# Patient Record
Sex: Female | Born: 1965
Health system: Southern US, Community
[De-identification: ages and names within clinical notes are randomized; demographics above are authoritative.]

## PROBLEM LIST (undated history)

## (undated) DIAGNOSIS — B379 Candidiasis, unspecified: Secondary | ICD-10-CM

## (undated) DIAGNOSIS — N390 Urinary tract infection, site not specified: Secondary | ICD-10-CM

## (undated) DIAGNOSIS — M259 Joint disorder, unspecified: Secondary | ICD-10-CM

## (undated) DIAGNOSIS — J189 Pneumonia, unspecified organism: Secondary | ICD-10-CM

## (undated) DIAGNOSIS — M1990 Unspecified osteoarthritis, unspecified site: Secondary | ICD-10-CM

## (undated) HISTORY — DX: Unspecified osteoarthritis, unspecified site: M19.90

## (undated) HISTORY — PX: LAPAROSCOPIC ABDOMINAL EXPLORATION: SHX6249

## (undated) HISTORY — DX: Urinary tract infection, site not specified: N39.0

---

## 1999-12-02 ENCOUNTER — Encounter: Admission: RE | Admit: 1999-12-02 | Discharge: 1999-12-02 | Payer: Self-pay | Admitting: Family Medicine

## 1999-12-02 ENCOUNTER — Encounter: Payer: Self-pay | Admitting: Family Medicine

## 2001-02-08 ENCOUNTER — Other Ambulatory Visit: Admission: RE | Admit: 2001-02-08 | Discharge: 2001-02-08 | Payer: Self-pay | Admitting: Obstetrics and Gynecology

## 2001-03-22 ENCOUNTER — Encounter: Payer: Self-pay | Admitting: Obstetrics and Gynecology

## 2001-03-22 ENCOUNTER — Ambulatory Visit (HOSPITAL_COMMUNITY): Admission: RE | Admit: 2001-03-22 | Discharge: 2001-03-22 | Payer: Self-pay | Admitting: Obstetrics and Gynecology

## 2001-04-06 ENCOUNTER — Encounter: Payer: Self-pay | Admitting: Obstetrics and Gynecology

## 2001-04-06 ENCOUNTER — Ambulatory Visit (HOSPITAL_COMMUNITY): Admission: RE | Admit: 2001-04-06 | Discharge: 2001-04-06 | Payer: Self-pay | Admitting: Obstetrics and Gynecology

## 2001-08-10 ENCOUNTER — Encounter (HOSPITAL_COMMUNITY): Admission: AD | Admit: 2001-08-10 | Discharge: 2001-08-13 | Payer: Self-pay | Admitting: Obstetrics and Gynecology

## 2001-08-14 ENCOUNTER — Inpatient Hospital Stay (HOSPITAL_COMMUNITY): Admission: AD | Admit: 2001-08-14 | Discharge: 2001-08-15 | Payer: Self-pay | Admitting: Obstetrics and Gynecology

## 2002-09-07 ENCOUNTER — Other Ambulatory Visit: Admission: RE | Admit: 2002-09-07 | Discharge: 2002-09-07 | Payer: Self-pay | Admitting: Obstetrics and Gynecology

## 2003-11-14 ENCOUNTER — Inpatient Hospital Stay (HOSPITAL_COMMUNITY): Admission: AD | Admit: 2003-11-14 | Discharge: 2003-11-15 | Payer: Self-pay | Admitting: Obstetrics and Gynecology

## 2003-12-26 ENCOUNTER — Other Ambulatory Visit: Admission: RE | Admit: 2003-12-26 | Discharge: 2003-12-26 | Payer: Self-pay | Admitting: Obstetrics and Gynecology

## 2005-01-28 ENCOUNTER — Other Ambulatory Visit: Admission: RE | Admit: 2005-01-28 | Discharge: 2005-01-28 | Payer: Self-pay | Admitting: Obstetrics and Gynecology

## 2006-02-11 ENCOUNTER — Ambulatory Visit (HOSPITAL_BASED_OUTPATIENT_CLINIC_OR_DEPARTMENT_OTHER): Admission: RE | Admit: 2006-02-11 | Discharge: 2006-02-11 | Payer: Self-pay | Admitting: Surgery

## 2007-05-13 HISTORY — PX: OTHER SURGICAL HISTORY: SHX169

## 2011-12-03 ENCOUNTER — Other Ambulatory Visit (HOSPITAL_COMMUNITY): Payer: Self-pay | Admitting: Internal Medicine

## 2011-12-03 DIAGNOSIS — Z1231 Encounter for screening mammogram for malignant neoplasm of breast: Secondary | ICD-10-CM

## 2011-12-23 ENCOUNTER — Ambulatory Visit (HOSPITAL_COMMUNITY)
Admission: RE | Admit: 2011-12-23 | Discharge: 2011-12-23 | Disposition: A | Discharge status: Home / Self Care | Payer: Self-pay | Source: Ambulatory Visit | Attending: Internal Medicine | Admitting: Internal Medicine

## 2011-12-23 DIAGNOSIS — Z1231 Encounter for screening mammogram for malignant neoplasm of breast: Secondary | ICD-10-CM

## 2013-06-04 ENCOUNTER — Encounter: Payer: Self-pay | Admitting: Physician Assistant

## 2013-06-07 ENCOUNTER — Ambulatory Visit: Payer: Self-pay | Admitting: Physician Assistant

## 2013-06-14 ENCOUNTER — Encounter: Payer: Self-pay | Admitting: Physician Assistant

## 2013-06-14 ENCOUNTER — Ambulatory Visit (INDEPENDENT_AMBULATORY_CARE_PROVIDER_SITE_OTHER): Payer: Self-pay | Admitting: Physician Assistant

## 2013-06-14 VITALS — BP 100/62 | HR 80 | Temp 98.1°F | Resp 16 | Ht 61.0 in | Wt 106.0 lb

## 2013-06-14 DIAGNOSIS — A630 Anogenital (venereal) warts: Secondary | ICD-10-CM

## 2013-06-14 DIAGNOSIS — M549 Dorsalgia, unspecified: Secondary | ICD-10-CM

## 2013-06-14 DIAGNOSIS — R1011 Right upper quadrant pain: Secondary | ICD-10-CM

## 2013-06-14 MED ORDER — MELOXICAM 15 MG PO TABS: 15.0000 mg | ORAL_TABLET | Freq: Every day | ORAL | Status: DC

## 2013-06-14 MED ORDER — TRAMADOL HCL 50 MG PO TABS: 50.0000 mg | ORAL_TABLET | Freq: Two times a day (BID) | ORAL | Status: DC | PRN

## 2013-06-14 MED ORDER — CYCLOBENZAPRINE HCL 10 MG PO TABS: 10.0000 mg | ORAL_TABLET | Freq: Three times a day (TID) | ORAL | Status: DC | PRN

## 2013-06-14 NOTE — Progress Notes (Signed)
   Subjective:    Patient ID: Cynthia Duran, female    DOB: [birth year], 48 y.o.   MRN: [id number]  HPI Patient presents for follow up. She vacuumed her couch on Friday and states she woke up Saturday with muscle spasm in the center of her back with numbness down her right arm, pain in the middle of her back and under left shoulder blade with pain into her right neck, she has take old flexeril that has helped. Worse depending on how she twists.    Review of Systems  Constitutional: Negative.   HENT: Negative.   Respiratory: Negative.   Cardiovascular: Negative.   Gastrointestinal: Positive for abdominal pain.  Musculoskeletal: Positive for arthralgias, back pain and myalgias. Negative for neck pain and neck stiffness.  Skin: Negative.        Objective:   Physical Exam  Constitutional: She is oriented to person, place, and time. She appears well-developed and well-nourished.  HENT:  Head: Normocephalic and atraumatic.  Right Ear: External ear normal.  Left Ear: External ear normal.  Mouth/Throat: Oropharynx is clear and moist.  Eyes: Conjunctivae and EOM are normal. Pupils are equal, round, and reactive to light.  Neck: Normal range of motion. Neck supple. No thyromegaly present.  Cardiovascular: Normal rate, regular rhythm and normal heart sounds.  Exam reveals no gallop and no friction rub.   No murmur heard. Pulmonary/Chest: Effort normal and breath sounds normal. No respiratory distress. She has no wheezes.  Abdominal: Soft. Bowel sounds are normal. She exhibits no distension and no mass. There is no tenderness. There is no rebound and no guarding.  Genitourinary:    There is no rash, tenderness, lesion or injury on the right labia. There is no rash, tenderness, lesion or injury on the left labia. No erythema around the vagina. No vaginal discharge found.  Musculoskeletal: Normal range of motion.       Thoracic back: She exhibits tenderness, pain and spasm. She exhibits no  bony tenderness, no swelling, no edema, no deformity and no laceration.  Lymphadenopathy:    She has no cervical adenopathy.  Neurological: She is alert and oriented to person, place, and time. She displays normal reflexes. No cranial nerve deficit. Coordination normal.  Skin: Skin is warm and dry.  Psychiatric: She has a normal mood and affect.      Assessment & Plan:  Back pain- very positional- Flexeril 10 #90 NR, Tramadol 50 #60 NR RUQ pain- normal exam- do low fat diet ? Genital warts, suggest going to health department

## 2013-06-14 NOTE — Patient Instructions (Addendum)
Genital Warts Genital warts are caused by a germ (human papillomavirus, HPV). This germ is spread by having unprotected sex (intercourse) with an infected person. It can be spread by vaginal, anal, and oral sex. A person who is infected might not show any signs or problems. HOME CARE  Follow your doctor's treatment instructions.  Do not use medicine that is meant for hand warts. Only take medicine as told by your doctor.  Tell your past and current sex partner(s) that you have genital warts. They may need treatment.  Avoid sexual contact while you are being treated.  Do not touch or scratch the warts. You could spread it to other parts of your body.  Women with genital warts should have a cervical cancer check (Pap test) at least once a year.  Tell your doctor if you become pregnant. The germ can be passed to the baby.  After treatment, use a condom during sex.  Ask your doctor before using anti-itch creams. GET HELP RIGHT AWAY IF:   Your treated skin becomes red, puffy (swollen), or painful.  You have a fever.  You feel generally sick.  You feel little lumps in and around your genital area.  You are bleeding or have pain during sex. MAKE SURE YOU:   Understand these instructions.  Will watch your condition.  Will get help right away if you are not doing well or get worse. Document Released: 07/23/2009 Document Revised: 07/21/2011 Document Reviewed: 11/04/2010 [facility] Patient Information 2014 [place], [place]. Back Pain, Adult Low back pain is very common. About 1 in 5 people have back pain.The cause of low back pain is rarely dangerous. The pain often gets better over time.About half of people with a sudden onset of back pain feel better in just 2 weeks. About 8 in 10 people feel better by 6 weeks.  CAUSES Some common causes of back pain include:  Strain of the muscles or ligaments supporting the spine.  Wear and tear (degeneration) of the spinal  discs.  Arthritis.  Direct injury to the back. DIAGNOSIS Most of the time, the direct cause of low back pain is not known.However, back pain can be treated effectively even when the exact cause of the pain is unknown.Answering your caregiver's questions about your overall health and symptoms is one of the most accurate ways to make sure the cause of your pain is not dangerous. If your caregiver needs more information, he or she may order lab work or imaging tests (X-rays or MRIs).However, even if imaging tests show changes in your back, this usually does not require surgery. HOME CARE INSTRUCTIONS For many people, back pain returns.Since low back pain is rarely dangerous, it is often a condition that people can learn to [facility] their own.   Remain active. It is stressful on the back to sit or stand in one place. Do not sit, drive, or stand in one place for more than 30 minutes at a time. Take short walks on level surfaces as soon as pain allows.Try to increase the length of time you walk each day.  Do not stay in bed.Resting more than 1 or 2 days can delay your recovery.  Do not avoid exercise or work.Your body is made to move.It is not dangerous to be active, even though your back may hurt.Your back will likely heal faster if you return to being active before your pain is gone.  Pay attention to your body when you bend and lift. Many people have less discomfortwhen lifting  if they bend their knees, keep the load close to their bodies,and avoid twisting. Often, the most comfortable positions are those that put less stress on your recovering back.  Find a comfortable position to sleep. Use a firm mattress and lie on your side with your knees slightly bent. If you lie on your back, put a pillow under your knees.  Only take over-the-counter or prescription medicines as directed by your caregiver. Over-the-counter medicines to reduce pain and inflammation are often the most  helpful.Your caregiver may prescribe muscle relaxant drugs.These medicines help dull your pain so you can more quickly return to your normal activities and healthy exercise.  Put ice on the injured area.  Put ice in a plastic bag.  Place a towel between your skin and the bag.  Leave the ice on for 15-20 minutes, 03-04 times a day for the first 2 to 3 days. After that, ice and heat may be alternated to reduce pain and spasms.  Ask your caregiver about trying back exercises and gentle massage. This may be of some benefit.  Avoid feeling anxious or stressed.Stress increases muscle tension and can worsen back pain.It is important to recognize when you are anxious or stressed and learn ways to manage it.Exercise is a great option. SEEK MEDICAL CARE IF:  You have pain that is not relieved with rest or medicine.  You have pain that does not improve in 1 week.  You have new symptoms.  You are generally not feeling well. SEEK IMMEDIATE MEDICAL CARE IF:   You have pain that radiates from your back into your legs.  You develop new bowel or bladder control problems.  You have unusual weakness or numbness in your arms or legs.  You develop nausea or vomiting.  You develop abdominal pain.  You feel faint. Document Released: 04/28/2005 Document Revised: 10/28/2011 Document Reviewed: 09/16/2010 [facility] Patient Information 2014 [place], [place]. Fat and Cholesterol Control Diet Fat and cholesterol levels in your blood and organs are influenced by your diet. High levels of fat and cholesterol may lead to diseases of the heart, small and large blood vessels, gallbladder, liver, and pancreas. CONTROLLING FAT AND CHOLESTEROL WITH DIET Although exercise and lifestyle factors are important, your diet is key. That is because certain foods are known to raise cholesterol and others to lower it. The goal is to balance foods for their effect on cholesterol and more importantly, to replace saturated  and trans fat with other types of fat, such as monounsaturated fat, polyunsaturated fat, and omega-3 fatty acids. On average, a person should consume no more than 15 to 17 g of saturated fat daily. Saturated and trans fats are considered "bad" fats, and they will raise LDL cholesterol. Saturated fats are primarily found in animal products such as meats, butter, and cream. However, that does not mean you need to give up all your favorite foods. Today, there are good tasting, low-fat, low-cholesterol substitutes for most of the things you like to eat. Choose low-fat or nonfat alternatives. Choose round or loin cuts of red meat. These types of cuts are lowest in fat and cholesterol. Chicken (without the skin), fish, veal, and ground [place] breast are great choices. Eliminate fatty meats, such as hot dogs and salami. Even shellfish have little or no saturated fat. Have a 3 oz (85 g) portion when you eat lean meat, poultry, or fish. Trans fats are also called "partially hydrogenated oils." They are oils that have been scientifically manipulated so that they are solid  at room temperature resulting in a longer shelf life and improved taste and texture of foods in which they are added. Trans fats are found in stick margarine, some tub margarines, cookies, crackers, and baked goods.  When baking and cooking, oils are a great substitute for butter. The monounsaturated oils are especially beneficial since it is believed they lower LDL and raise HDL. The oils you should avoid entirely are saturated tropical oils, such as coconut and palm.  Remember to eat a lot from food groups that are naturally free of saturated and trans fat, including fish, fruit, vegetables, beans, grains (barley, rice, couscous, bulgur wheat), and pasta (without cream sauces).  IDENTIFYING FOODS THAT LOWER FAT AND CHOLESTEROL  Soluble fiber may lower your cholesterol. This type of fiber is found in fruits such as apples, vegetables such as broccoli,  potatoes, and carrots, legumes such as beans, peas, and lentils, and grains such as barley. Foods fortified with plant sterols (phytosterol) may also lower cholesterol. You should eat at least 2 g per day of these foods for a cholesterol lowering effect.  Read package labels to identify low-saturated fats, trans fat free, and low-fat foods at the supermarket. Select cheeses that have only 2 to 3 g saturated fat per ounce. Use a heart-healthy tub margarine that is free of trans fats or partially hydrogenated oil. When buying baked goods (cookies, crackers), avoid partially hydrogenated oils. Breads and muffins should be made from whole grains (whole-wheat or whole oat flour, instead of "flour" or "enriched flour"). Buy non-creamy canned soups with reduced salt and no added fats.  FOOD PREPARATION TECHNIQUES  Never deep-fry. If you must fry, either stir-fry, which uses very little fat, or use non-stick cooking sprays. When possible, broil, bake, or roast meats, and steam vegetables. Instead of putting butter or margarine on vegetables, use lemon and herbs, applesauce, and cinnamon (for squash and sweet potatoes). Use nonfat yogurt, salsa, and low-fat dressings for salads.  LOW-SATURATED FAT / LOW-FAT FOOD SUBSTITUTES Meats / Saturated Fat (g)  Avoid: Steak, marbled (3 oz/85 g) / 11 g  Choose: Steak, lean (3 oz/85 g) / 4 g  Avoid: Hamburger (3 oz/85 g) / 7 g  Choose: Hamburger, lean (3 oz/85 g) / 5 g  Avoid: Ham (3 oz/85 g) / 6 g  Choose: Ham, lean cut (3 oz/85 g) / 2.4 g  Avoid: Chicken, with skin, dark meat (3 oz/85 g) / 4 g  Choose: Chicken, skin removed, dark meat (3 oz/85 g) / 2 g  Avoid: Chicken, with skin, light meat (3 oz/85 g) / 2.5 g  Choose: Chicken, skin removed, light meat (3 oz/85 g) / 1 g Dairy / Saturated Fat (g)  Avoid: Whole milk (1 cup) / 5 g  Choose: Low-fat milk, 2% (1 cup) / 3 g  Choose: Low-fat milk, 1% (1 cup) / 1.5 g  Choose: Skim milk (1 cup) / 0.3 g  Avoid:  Hard cheese (1 oz/28 g) / 6 g  Choose: Skim milk cheese (1 oz/28 g) / 2 to 3 g  Avoid: Cottage cheese, 4% fat (1 cup) / 6.5 g  Choose: Low-fat cottage cheese, 1% fat (1 cup) / 1.5 g  Avoid: Ice cream (1 cup) / 9 g  Choose: Sherbet (1 cup) / 2.5 g  Choose: Nonfat frozen yogurt (1 cup) / 0.3 g  Choose: Frozen fruit bar / trace  Avoid: Whipped cream (1 tbs) / 3.5 g  Choose: Nondairy whipped topping (1 tbs) / 1 g  Condiments / Saturated Fat (g)  Avoid: Mayonnaise (1 tbs) / 2 g  Choose: Low-fat mayonnaise (1 tbs) / 1 g  Avoid: Butter (1 tbs) / 7 g  Choose: Extra light margarine (1 tbs) / 1 g  Avoid: Coconut oil (1 tbs) / 11.8 g  Choose: Olive oil (1 tbs) / 1.8 g  Choose: Corn oil (1 tbs) / 1.7 g  Choose: Safflower oil (1 tbs) / 1.2 g  Choose: Sunflower oil (1 tbs) / 1.4 g  Choose: Soybean oil (1 tbs) / 2.4 g  Choose: Canola oil (1 tbs) / 1 g Document Released: 04/28/2005 Document Revised: 08/23/2012 Document Reviewed: 10/17/2010 [employer] Patient Information 2014 [place], [place].

## 2013-07-05 ENCOUNTER — Ambulatory Visit: Payer: Self-pay | Admitting: Physician Assistant

## 2013-07-25 ENCOUNTER — Other Ambulatory Visit: Payer: Self-pay | Admitting: Physician Assistant

## 2013-07-25 MED ORDER — ACYCLOVIR 400 MG PO TABS: 400.0000 mg | ORAL_TABLET | Freq: Every day | ORAL | Status: AC

## 2014-02-27 ENCOUNTER — Emergency Department (HOSPITAL_COMMUNITY)
Admission: EM | Admit: 2014-02-27 | Discharge: 2014-02-27 | Disposition: A | Discharge status: Home / Self Care | Payer: Medicaid Other | Attending: Emergency Medicine | Admitting: Emergency Medicine

## 2014-02-27 ENCOUNTER — Emergency Department (HOSPITAL_COMMUNITY): Payer: Medicaid Other

## 2014-02-27 ENCOUNTER — Encounter (HOSPITAL_COMMUNITY): Payer: Self-pay | Admitting: Emergency Medicine

## 2014-02-27 DIAGNOSIS — X58XXXA Exposure to other specified factors, initial encounter: Secondary | ICD-10-CM | POA: Insufficient documentation

## 2014-02-27 DIAGNOSIS — S39012A Strain of muscle, fascia and tendon of lower back, initial encounter: Secondary | ICD-10-CM | POA: Insufficient documentation

## 2014-02-27 DIAGNOSIS — R05 Cough: Secondary | ICD-10-CM | POA: Insufficient documentation

## 2014-02-27 DIAGNOSIS — R109 Unspecified abdominal pain: Secondary | ICD-10-CM | POA: Diagnosis not present

## 2014-02-27 DIAGNOSIS — Z720 Tobacco use: Secondary | ICD-10-CM | POA: Insufficient documentation

## 2014-02-27 DIAGNOSIS — Z791 Long term (current) use of non-steroidal anti-inflammatories (NSAID): Secondary | ICD-10-CM | POA: Insufficient documentation

## 2014-02-27 DIAGNOSIS — Y939 Activity, unspecified: Secondary | ICD-10-CM | POA: Diagnosis not present

## 2014-02-27 DIAGNOSIS — M549 Dorsalgia, unspecified: Secondary | ICD-10-CM | POA: Diagnosis present

## 2014-02-27 DIAGNOSIS — Z87440 Personal history of urinary (tract) infections: Secondary | ICD-10-CM | POA: Insufficient documentation

## 2014-02-27 DIAGNOSIS — Z3202 Encounter for pregnancy test, result negative: Secondary | ICD-10-CM | POA: Insufficient documentation

## 2014-02-27 DIAGNOSIS — Y929 Unspecified place or not applicable: Secondary | ICD-10-CM | POA: Insufficient documentation

## 2014-02-27 DIAGNOSIS — M1990 Unspecified osteoarthritis, unspecified site: Secondary | ICD-10-CM | POA: Diagnosis not present

## 2014-02-27 LAB — URINALYSIS, ROUTINE W REFLEX MICROSCOPIC
BILIRUBIN URINE: NEGATIVE
GLUCOSE, UA: NEGATIVE mg/dL
Hgb urine dipstick: NEGATIVE
KETONES UR: NEGATIVE mg/dL
Leukocytes, UA: NEGATIVE
Nitrite: NEGATIVE
Protein, ur: NEGATIVE mg/dL
Specific Gravity, Urine: 1.002 — ABNORMAL LOW (ref 1.005–1.030)
Urobilinogen, UA: 0.2 mg/dL (ref 0.0–1.0)
pH: 6 (ref 5.0–8.0)

## 2014-02-27 LAB — POC URINE PREG, ED: Preg Test, Ur: NEGATIVE

## 2014-02-27 MED ORDER — KETOROLAC TROMETHAMINE 60 MG/2ML IM SOLN
60.0000 mg | Freq: Once | INTRAMUSCULAR | Status: AC
  Administered 2014-02-27: 60 mg via INTRAMUSCULAR
  Filled 2014-02-27: qty 2

## 2014-02-27 MED ORDER — NAPROXEN 500 MG PO TABS
500.0000 mg | ORAL_TABLET | Freq: Two times a day (BID) | ORAL | Status: DC
Start: 2014-02-27 — End: 2014-03-27

## 2014-02-27 MED ORDER — HYDROCODONE-ACETAMINOPHEN 5-325 MG PO TABS: 1.0000 | ORAL_TABLET | ORAL | Status: DC | PRN

## 2014-02-27 MED ORDER — DIAZEPAM 5 MG PO TABS: 5.0000 mg | ORAL_TABLET | Freq: Four times a day (QID) | ORAL | Status: DC | PRN

## 2014-02-27 NOTE — ED Provider Notes (Signed)
CSN: [id number]     Arrival date & time 02/27/14  1744 History  This chart was scribed for a non-physician practitioner, Jeannett Senior, PA-C, working with Leota Jacobsen, MD by Cathie Hoops, ED Scribe. The patient was seen in W[facility]6/[facility]. The patient's care was started at 6:31 PM.   Chief Complaint  Patient presents with  . Back Pain   (Consider location/radiation/quality/duration/timing/severity/associated sxs/prior Treatment) The history is provided by the patient. No language interpreter was used.   HPI Comments: Cynthia Duran is a 48 y.o. female who presents to the Emergency Department complaining of acute, moderate, gradually worsening back pain onset three days ago. Pt has associated cough, abdominal pain and back pain. Pt notes her pain radiates to her lower back and wraps around to her abdomen. Pt notes her cough changed this morning and sounds less like a croupy cough and more like a smoker's cough. Pt states she picked up several storage boxes the day before her pain began. Pt notes she feels like she is having gallbladder "attacks". Pt notes her pain is worsened with ROM, laying down, sitting down. She states she has 650 mg acetaminophen and 50 mg Tramadol with minimal relief. Pt denies bladder incontinence, bowel incontinence, dysuria, frequency, fever, constipation, SOB, nausea, vomiting and diarrhea.  Pt denies having PCP. Pt states she drove here today.  Past Medical History  Diagnosis Date  . Arthritis   . Allergy   . Frequent UTI    History reviewed. No pertinent past surgical history. Family History  Problem Relation Age of Onset  . Arthritis Mother     Rhematoid  . COPD Mother   . Heart disease Father   . Hypertension Father    History  Substance Use Topics  . Smoking status: Current Every Day Smoker -- 1.50 packs/day for 30 years  . Smokeless tobacco: Never Used  . Alcohol Use: No   OB History   Grav Para Term Preterm Abortions TAB SAB Ect Mult Living                  Review of Systems  Constitutional: Negative for fever.  Respiratory: Positive for cough. Negative for shortness of breath.   Gastrointestinal: Positive for abdominal pain. Negative for nausea, vomiting, constipation and abdominal distention.  Genitourinary: Negative for dysuria, frequency, flank pain and difficulty urinating.  Musculoskeletal: Positive for arthralgias and back pain. Negative for gait problem.  Skin: Negative for rash.   Allergies  Review of patient's allergies indicates no known allergies.  Home Medications   Prior to Admission medications   Medication Sig Start Date End Date Taking? Authorizing Provider  cyclobenzaprine (FLEXERIL) 10 MG tablet Take 1 tablet (10 mg total) by mouth 3 (three) times daily as needed for muscle spasms. 06/14/13   Vicie Mutters, PA-C  meloxicam (MOBIC) 15 MG tablet Take 1 tablet (15 mg total) by mouth daily. 06/14/13   Vicie Mutters, PA-C  traMADol (ULTRAM) 50 MG tablet Take 1 tablet (50 mg total) by mouth every 12 (twelve) hours as needed. 06/14/13   Vicie Mutters, PA-C   Triage Vitals: BP 112/73  Pulse 79  Temp(Src) 97.6 F (36.4 C) (Oral)  Resp 18  SpO2 95%  Physical Exam  Nursing note and vitals reviewed. Constitutional: She is oriented to person, place, and time. She appears well-developed and well-nourished. No distress.  HENT:  Head: Normocephalic.  Eyes: Conjunctivae are normal.  Neck: Neck supple.  Cardiovascular: Normal rate, regular rhythm and normal heart sounds.  Pulmonary/Chest: Effort normal and breath sounds normal. No respiratory distress. She has no wheezes. She has no rales.  Abdominal: Soft. Bowel sounds are normal. She exhibits no distension. There is no tenderness. There is no rebound.  Left CVA tenderness  Musculoskeletal: She exhibits no edema.  Tenderness over left lumbar paraspinal muscles. Pain with left straight leg raise. Normal hip exam. No midline lumbar spine tenderness.   Neurological: She is alert and oriented to person, place, and time.  5/5 and equal lower extremity strength. 2+ and equal patellar reflexes bilaterally. Pt able to dorsiflex bilateral toes and feet with good strength against resistance. Equal sensation bilaterally over thighs and lower legs.   Skin: Skin is warm and dry.  Psychiatric: She has a normal mood and affect. Her behavior is normal.    ED Course  Procedures (including critical care time) DIAGNOSTIC STUDIES: Oxygen Saturation is 95% on RA, adequate by my interpretation.    COORDINATION OF CARE: 6:36 PM- Patient informed of current plan for treatment and evaluation and agrees with plan at this time.    Labs Review Labs Reviewed  URINALYSIS, ROUTINE W REFLEX MICROSCOPIC - Abnormal; Notable for the following:    Specific Gravity, Urine 1.002 (*)    All other components within normal limits  POC URINE PREG, ED    Imaging Review Dg Chest 2 View  02/27/2014   CLINICAL DATA:  Cough, low back pain.  EXAM: CHEST  2 VIEW  COMPARISON:  None.  FINDINGS: The heart size and mediastinal contours are within normal limits. Hyperexpansion of the lungs is noted. No pneumothorax or pleural effusion is noted. Both lungs are clear. The visualized skeletal structures are unremarkable.  IMPRESSION: No acute cardiopulmonary abnormality seen.   Electronically Signed   By: Sabino Dick M.D.   On: 02/27/2014 19:43     EKG Interpretation None      MDM   Final diagnoses:  Lumbar strain, initial encounter   Patient with pleuritic left lumbar paraspinal pain, also worsened with movement. She does remember lifting some boxes, but nothing that she thought would injure her back. Pain is not relieved with at-home treatments. She has no neuro deficits. She does admit to worsening cough. She is a smoker. At this time I doubt patient has a PE, no lower extremity swelling, normal oxygen saturation and heart rate. Will get a chest x-ray to rule out left  lower lobe pneumonia vs versus effusion. Will get urinalysis to rule out pyelonephritis. Will treat with Toradol IM. Patient did drive herself here  Filed Vitals:   02/27/14 1758 02/27/14 2012  BP: 112/73 110/68  Pulse: 79 68  Temp: 97.6 F (36.4 C) 98 F (36.7 C)  TempSrc: Oral Oral  Resp: 18 16  SpO2: 95% 98%    I personally performed the services described in this documentation, which was scribed in my presence. The recorded information has been reviewed and is accurate.    Renold Genta, PA-C 02/27/14 2024

## 2014-02-27 NOTE — Discharge Instructions (Signed)
Naprosyn for inflammation and pain.  norco for severe pain. Valium for spasms as prescribed as needed.  Rest. Heating pads. Stretches. Follow up with your doctor or return here if symptoms worsening.   Lumbosacral Strain Lumbosacral strain is a strain of any of the parts that make up your lumbosacral vertebrae. Your lumbosacral vertebrae are the bones that make up the lower third of your backbone. Your lumbosacral vertebrae are held together by muscles and tough, fibrous tissue (ligaments).  CAUSES  A sudden blow to your back can cause lumbosacral strain. Also, anything that causes an excessive stretch of the muscles in the low back can cause this strain. This is typically seen when people exert themselves strenuously, fall, lift heavy objects, bend, or crouch repeatedly. RISK FACTORS  Physically demanding work.  Participation in pushing or pulling sports or sports that require a sudden twist of the back (tennis, golf, baseball).  Weight lifting.  Excessive lower back curvature.  Forward-tilted pelvis.  Weak back or abdominal muscles or both.  Tight hamstrings. SIGNS AND SYMPTOMS  Lumbosacral strain may cause pain in the area of your injury or pain that moves (radiates) down your leg.  DIAGNOSIS Your health care provider can often diagnose lumbosacral strain through a physical exam. In some cases, you may need tests such as X-ray exams.  TREATMENT  Treatment for your lower back injury depends on many factors that your clinician will have to evaluate. However, most treatment will include the use of anti-inflammatory medicines. HOME CARE INSTRUCTIONS   Avoid hard physical activities (tennis, racquetball, waterskiing) if you are not in proper physical condition for it. This may aggravate or create problems.  If you have a back problem, avoid sports requiring sudden body movements. Swimming and walking are generally safer activities.  Maintain good posture.  Maintain a healthy  weight.  For acute conditions, you may put ice on the injured area.  Put ice in a plastic bag.  Place a towel between your skin and the bag.  Leave the ice on for 20 minutes, 2-3 times a day.  When the low back starts healing, stretching and strengthening exercises may be recommended. SEEK MEDICAL CARE IF:  Your back pain is getting worse.  You experience severe back pain not relieved with medicines. SEEK IMMEDIATE MEDICAL CARE IF:   You have numbness, tingling, weakness, or problems with the use of your arms or legs.  There is a change in bowel or bladder control.  You have increasing pain in any area of the body, including your belly (abdomen).  You notice shortness of breath, dizziness, or feel faint.  You feel sick to your stomach (nauseous), are throwing up (vomiting), or become sweaty.  You notice discoloration of your toes or legs, or your feet get very cold. MAKE SURE YOU:   Understand these instructions.  Will watch your condition.  Will get help right away if you are not doing well or get worse. Document Released: 02/05/2005 Document Revised: 05/03/2013 Document Reviewed: 12/15/2012 [facility] Patient Information 2015 [place], [place]. This information is not intended to replace advice given to you by your health care provider. Make sure you discuss any questions you have with your health care provider.

## 2014-02-27 NOTE — ED Provider Notes (Signed)
Medical screening examination/treatment/procedure(s) were performed by non-physician practitioner and as supervising physician I was immediately available for consultation/collaboration.  Leota Jacobsen, MD 02/27/14 [id number]

## 2014-02-27 NOTE — ED Notes (Signed)
Pt presents with c/o lower back pain that started approx 3 days ago. Pt reports she did not injure her back to her knowledge, ambulatory to triage.

## 2014-03-27 ENCOUNTER — Encounter: Payer: Self-pay | Admitting: Family Medicine

## 2014-03-27 ENCOUNTER — Ambulatory Visit: Payer: Medicaid Other | Attending: Family Medicine | Admitting: Family Medicine

## 2014-03-27 VITALS — BP 120/77 | HR 75 | Temp 98.4°F | Resp 18 | Wt 106.0 lb

## 2014-03-27 DIAGNOSIS — M545 Low back pain, unspecified: Secondary | ICD-10-CM

## 2014-03-27 DIAGNOSIS — Z720 Tobacco use: Secondary | ICD-10-CM

## 2014-03-27 DIAGNOSIS — E28319 Asymptomatic premature menopause: Secondary | ICD-10-CM | POA: Insufficient documentation

## 2014-03-27 DIAGNOSIS — F17200 Nicotine dependence, unspecified, uncomplicated: Secondary | ICD-10-CM

## 2014-03-27 DIAGNOSIS — R635 Abnormal weight gain: Secondary | ICD-10-CM | POA: Diagnosis not present

## 2014-03-27 DIAGNOSIS — Z87891 Personal history of nicotine dependence: Secondary | ICD-10-CM | POA: Diagnosis not present

## 2014-03-27 DIAGNOSIS — M8580 Other specified disorders of bone density and structure, unspecified site: Secondary | ICD-10-CM

## 2014-03-27 MED ORDER — MELOXICAM 15 MG PO TABS: 15.0000 mg | ORAL_TABLET | Freq: Every day | ORAL | Status: DC

## 2014-03-27 MED ORDER — CYCLOBENZAPRINE HCL 10 MG PO TABS: 10.0000 mg | ORAL_TABLET | Freq: Three times a day (TID) | ORAL | Status: DC | PRN

## 2014-03-27 NOTE — Patient Instructions (Signed)
Ms. Ghazi,  Thank you for coming in today. It was a pleasure meeting you. I look forward to being your primary doctor.   1. Low back pain: X-ray of low back and hip. Go [facility]. Flexeril as needed, muscle relaxer Mobic daily for next week, then as needed, antiinflammatory  I have referred you to physical therapy.  Labs: TSH, check thyroid CMP, CBC, vit D level  Please come back to see me in 2-4 weeks for back pain follow up and physical with pap. The recommendation is to start bone density scan at age 7, but we will look at your x-rays and see if there is evidence of bone loss necessitating an earlier study.  Dr. Adrian Blackwater

## 2014-03-27 NOTE — Progress Notes (Signed)
Establish Care Complaining of low back pain Stated was in the emergency room last month and no changes since then

## 2014-03-27 NOTE — Progress Notes (Signed)
   Subjective:    Patient ID: Cynthia Duran, female    DOB: May 15, 1965, 48 y.o.   MRN: [id number] CC: establish care, low back pain  HPI  1. Low back: one month ago. Has history of tailbone pain x 25 years since delivering oldest child. Pain is bilateral. Pain does radiate upward. Does not radiate down the legs. Everyday pain. Constant pain. Achy pain and crampy pain.   Soc Hx: smoker 1.5 PPD Med Hx: arthritis Fam Hx: mom passed with COPD  Review of Systems As per HPI   GAD 7 score of 14. 0-7. 1-6. 2-2 and 4. 3-1,3,5.     Objective:   Physical Exam BP 120/77 mmHg  Pulse 75  Temp(Src) 98.4 F (36.9 C) (Oral)  Resp 18  Wt 106 lb (48.081 kg)  SpO2 97% General appearance: alert, cooperative and no distress Lungs: clear to auscultation bilaterally Heart: regular rate and rhythm, S1, S2 normal, no murmur, click, rub or gallop Back Exam: Back: Normal Curvature, no deformities or CVA tenderness  Paraspinal Tenderness: L5 and S1 paraspinal   LE Strength 5/5  LE Sensation: in tact  LE Reflexes 2+ and symmetric  Straight leg raise: negative       Assessment & Plan:

## 2014-03-27 NOTE — Assessment & Plan Note (Signed)
A; patient has gained 10 #  P: Screening TSH

## 2014-03-27 NOTE — Assessment & Plan Note (Signed)
A: low back pain w/o sciatica   P: NSAID Flexeril PT Check vit D Lumbar and hip x-ray

## 2014-03-28 ENCOUNTER — Telehealth: Payer: Self-pay | Admitting: Family Medicine

## 2014-03-28 ENCOUNTER — Ambulatory Visit (HOSPITAL_COMMUNITY)
Admission: RE | Admit: 2014-03-28 | Discharge: 2014-03-28 | Disposition: A | Discharge status: Home / Self Care | Payer: Medicaid Other | Source: Ambulatory Visit | Attending: Family Medicine | Admitting: Family Medicine

## 2014-03-28 DIAGNOSIS — M545 Low back pain, unspecified: Secondary | ICD-10-CM

## 2014-03-28 DIAGNOSIS — R102 Pelvic and perineal pain: Secondary | ICD-10-CM | POA: Diagnosis not present

## 2014-03-28 LAB — CBC
HEMATOCRIT: 42.7 % (ref 36.0–46.0)
HEMOGLOBIN: 14.7 g/dL (ref 12.0–15.0)
MCH: 30.8 pg (ref 26.0–34.0)
MCHC: 34.4 g/dL (ref 30.0–36.0)
MCV: 89.5 fL (ref 78.0–100.0)
MPV: 9.7 fL (ref 9.4–12.4)
Platelets: 247 10*3/uL (ref 150–400)
RBC: 4.77 MIL/uL (ref 3.87–5.11)
RDW: 13.1 % (ref 11.5–15.5)
WBC: 8.8 10*3/uL (ref 4.0–10.5)

## 2014-03-28 LAB — COMPLETE METABOLIC PANEL WITH GFR
ALK PHOS: 65 U/L (ref 39–117)
ALT: 10 U/L (ref 0–35)
AST: 16 U/L (ref 0–37)
Albumin: 4.6 g/dL (ref 3.5–5.2)
BILIRUBIN TOTAL: 0.4 mg/dL (ref 0.2–1.2)
BUN: 4 mg/dL — ABNORMAL LOW (ref 6–23)
CO2: 29 mEq/L (ref 19–32)
Calcium: 9.6 mg/dL (ref 8.4–10.5)
Chloride: 106 mEq/L (ref 96–112)
Creat: 0.64 mg/dL (ref 0.50–1.10)
GFR, Est African American: >89 mL/min
GFR, Est Non African American: >89 mL/min
Glucose, Bld: 104 mg/dL — ABNORMAL HIGH (ref 70–99)
Potassium: 4.7 mEq/L (ref 3.5–5.3)
SODIUM: 142 meq/L (ref 135–145)
Total Protein: 6.9 g/dL (ref 6.0–8.3)

## 2014-03-28 LAB — TSH: TSH: 1.247 u[IU]/mL (ref 0.350–4.500)

## 2014-03-28 LAB — VITAMIN D 25 HYDROXY (VIT D DEFICIENCY, FRACTURES): Vit D, 25-Hydroxy: 39 ng/mL (ref 30–100)

## 2014-03-28 NOTE — Telephone Encounter (Signed)
Please call patient. Normal labs. Vit D, TSH, CBC, CMP,

## 2014-03-31 NOTE — Addendum Note (Signed)
Addended by: Boykin Nearing on: 03/31/2014 03:16 PM   Modules accepted: Orders

## 2014-04-03 ENCOUNTER — Telehealth: Payer: Self-pay | Admitting: *Deleted

## 2014-04-03 NOTE — Telephone Encounter (Signed)
Pt aware of results 

## 2014-04-03 NOTE — Telephone Encounter (Signed)
-----   Message from Minerva Ends, MD sent at 03/31/2014  3:15 PM EST ----- Mild disc disease in low back especially L2-L3, No hip arthritis.  Plan for f/u dexa scan, will need to be scheduled.

## 2014-04-18 ENCOUNTER — Other Ambulatory Visit (HOSPITAL_COMMUNITY)
Admission: RE | Admit: 2014-04-18 | Discharge: 2014-04-18 | Disposition: A | Discharge status: Home / Self Care | Payer: Medicaid Other | Source: Ambulatory Visit | Attending: Family Medicine | Admitting: Family Medicine

## 2014-04-18 ENCOUNTER — Encounter: Payer: Self-pay | Admitting: Family Medicine

## 2014-04-18 ENCOUNTER — Ambulatory Visit: Payer: Medicaid Other | Attending: Family Medicine | Admitting: Family Medicine

## 2014-04-18 VITALS — BP 110/74 | HR 72 | Temp 98.4°F | Resp 16 | Ht 61.0 in | Wt 110.0 lb

## 2014-04-18 DIAGNOSIS — R1011 Right upper quadrant pain: Secondary | ICD-10-CM | POA: Insufficient documentation

## 2014-04-18 DIAGNOSIS — B373 Candidiasis of vulva and vagina: Secondary | ICD-10-CM | POA: Diagnosis not present

## 2014-04-18 DIAGNOSIS — Z113 Encounter for screening for infections with a predominantly sexual mode of transmission: Secondary | ICD-10-CM | POA: Diagnosis present

## 2014-04-18 DIAGNOSIS — Z0000 Encounter for general adult medical examination without abnormal findings: Secondary | ICD-10-CM | POA: Diagnosis not present

## 2014-04-18 DIAGNOSIS — Z124 Encounter for screening for malignant neoplasm of cervix: Secondary | ICD-10-CM | POA: Insufficient documentation

## 2014-04-18 DIAGNOSIS — B3731 Acute candidiasis of vulva and vagina: Secondary | ICD-10-CM

## 2014-04-18 DIAGNOSIS — R109 Unspecified abdominal pain: Secondary | ICD-10-CM | POA: Diagnosis not present

## 2014-04-18 DIAGNOSIS — Z1151 Encounter for screening for human papillomavirus (HPV): Secondary | ICD-10-CM | POA: Insufficient documentation

## 2014-04-18 DIAGNOSIS — Z0001 Encounter for general adult medical examination with abnormal findings: Secondary | ICD-10-CM | POA: Diagnosis present

## 2014-04-18 DIAGNOSIS — N760 Acute vaginitis: Secondary | ICD-10-CM | POA: Insufficient documentation

## 2014-04-18 DIAGNOSIS — Z01419 Encounter for gynecological examination (general) (routine) without abnormal findings: Secondary | ICD-10-CM | POA: Diagnosis present

## 2014-04-18 LAB — CERVICOVAGINAL ANCILLARY ONLY
WET PREP (BD AFFIRM): NEGATIVE
WET PREP (BD AFFIRM): NEGATIVE
WET PREP (BD AFFIRM): POSITIVE — AB

## 2014-04-18 NOTE — Assessment & Plan Note (Signed)
A; intermittent pains. Normal exam P: Abdominal ultrasound to evaluate gallbladder

## 2014-04-18 NOTE — Progress Notes (Signed)
   Subjective:    Patient ID: Cynthia Duran, female    DOB: 1966/04/29, 48 y.o.   MRN: [id number] CC: wellness physical  HPI No acute complaints or concerns    Review of Systems General:  Negative for nexplained weight loss, fever Skin: Negative for new or changing mole, sore that won't heal HEENT: Negative for trouble hearing, trouble seeing, ringing in ears, mouth sores, hoarseness, change in voice, dysphagia. CV:  Negative for chest pain, dyspnea, edema, palpitations Resp: Negative for cough, dyspnea, hemoptysis GI: positive for intermittent sharp RUQ pains. Negative for nausea, vomiting, diarrhea, constipation, , melena, hematochezia. GU: Negative for dysuria, incontinence, urinary hesitance, hematuria, vaginal or penile discharge, polyuria, sexual difficulty, lumps in testicle or breasts MSK: Positive for low back pain. Otherwise, negative for muscle cramps or aches, joint pain or swelling Neuro: Negative for headaches, weakness, numbness, dizziness, passing out/fainting Psych: Negative for depression, anxiety, memory problems      Objective:   Physical Exam BP 110/74 mmHg  Pulse 72  Temp(Src) 98.4 F (36.9 C) (Oral)  Resp 16  Ht 5\' 1"  (1.549 m)  Wt 110 lb (49.896 kg)  BMI 20.80 kg/m2  SpO2 99%  LMP 09/27/2007 (Exact Date)  General Appearance:    Alert, cooperative, no distress, appears stated age  Head:    Normocephalic, without obvious abnormality, atraumatic  Eyes:    PERRL, conjunctiva/corneas clear, EOM's intact,   , both eyes  Ears:    Normal TM's and external ear canals, both ears  Nose:   Nares normal, septum midline, mucosa normal, no drainage    or sinus tenderness  Throat:   Lips, mucosa, and tongue normal; teeth and gums normal  Neck:   Supple, symmetrical, trachea midline, no adenopathy;    thyroid:  no enlargement/tenderness/nodules; no carotid   bruit or JVD  Back:     Symmetric, no curvature, ROM normal, no CVA tenderness  Lungs:     Clear to  auscultation bilaterally, respirations unlabored  Chest Wall:    No tenderness or deformity   Heart:    Regular rate and rhythm, S1 and S2 normal, no murmur, rub   or gallop  Breast Exam:    No tenderness, masses, or nipple abnormality  Abdomen:     Soft, non-tender, bowel sounds active all four quadrants,    no masses, no organomegaly  Genitalia:    Normal female one small papule with ingrown hair L perineum, no discharge or tenderness. Normal vagina and cervix. No CMT, uterine or adnexal mass or tenderness.   Rectal:    Deffered  Extremities:   Extremities normal, atraumatic, no cyanosis or edema  Pulses:   2+ and symmetric all extremities  Skin:   Skin color, texture, turgor normal, no rashes or lesions  Lymph nodes:   Cervical, supraclavicular, and axillary nodes normal  Neurologic:   CNII-XII intact, normal strength, sensation and reflexes    throughout         Assessment & Plan:

## 2014-04-18 NOTE — Assessment & Plan Note (Signed)
Pap done today  

## 2014-04-18 NOTE — Progress Notes (Signed)
F/U Back pain, stated no changes since last visit Annual Physical and pap

## 2014-04-18 NOTE — Patient Instructions (Signed)
Cynthia Duran,  Thank you for coming in today. Your exam is normal.   Have a happy holiday. I will call you with bone density scan results.   F/u in 4 weeks.  Dr. Adrian Blackwater

## 2014-04-19 DIAGNOSIS — B373 Candidiasis of vulva and vagina: Secondary | ICD-10-CM | POA: Insufficient documentation

## 2014-04-19 DIAGNOSIS — B3731 Acute candidiasis of vulva and vagina: Secondary | ICD-10-CM | POA: Insufficient documentation

## 2014-04-19 LAB — CYTOLOGY - PAP

## 2014-04-19 LAB — CERVICOVAGINAL ANCILLARY ONLY
Chlamydia: NEGATIVE
Neisseria Gonorrhea: NEGATIVE

## 2014-04-19 MED ORDER — FLUCONAZOLE 150 MG PO TABS: 150.0000 mg | ORAL_TABLET | Freq: Once | ORAL | Status: DC

## 2014-04-19 NOTE — Addendum Note (Signed)
Addended by: Boykin Nearing on: 04/19/2014 09:20 AM   Modules accepted: Orders

## 2014-04-19 NOTE — Assessment & Plan Note (Signed)
Noted on screening wet prep. No symptoms. Diflucan x one

## 2014-04-20 ENCOUNTER — Ambulatory Visit (HOSPITAL_COMMUNITY)
Admission: RE | Admit: 2014-04-20 | Discharge: 2014-04-20 | Disposition: A | Discharge status: Home / Self Care | Payer: Medicaid Other | Source: Ambulatory Visit | Attending: Family Medicine | Admitting: Family Medicine

## 2014-04-20 DIAGNOSIS — M545 Low back pain: Secondary | ICD-10-CM | POA: Insufficient documentation

## 2014-04-24 ENCOUNTER — Telehealth: Payer: Self-pay | Admitting: *Deleted

## 2014-04-24 NOTE — Telephone Encounter (Signed)
-----   Message from Minerva Ends, MD sent at 04/19/2014  9:18 AM EST ----- Yeast on wet prep will treat with diflucan x one. Awaiting GC/chlam and pap.

## 2014-04-24 NOTE — Telephone Encounter (Signed)
Left voice message to return call 

## 2014-04-24 NOTE — Telephone Encounter (Signed)
-----   Message from Minerva Ends, MD sent at 04/20/2014  5:28 PM EST ----- Pap negative repeat in 5 years

## 2014-04-24 NOTE — Telephone Encounter (Signed)
Pt aware of pap smear results    Betti Cruz, RMA at 04/24/2014 9:11 AM    Status: Signed      Expand All Collapse All    Left voice message to return call     Betti Cruz, RMA at 04/24/2014 9:11 AM    Status: Signed      Expand All Collapse All    ----- Message from Minerva Ends, MD sent at 04/20/2014 5:28 PM EST ----- Pap negative repeat in 5 years   Betti Cruz, RMA at 04/24/2014 9:10 AM    Status: Signed      Expand All Collapse All    ----- Message from Minerva Ends, MD sent at 04/19/2014 9:18 AM EST ----- Yeast on wet prep will treat with diflucan x one. Awaiting GC/chlam and pap.

## 2014-04-25 ENCOUNTER — Ambulatory Visit (HOSPITAL_COMMUNITY): Admission: RE | Admit: 2014-04-25 | Payer: Medicaid Other | Source: Ambulatory Visit

## 2014-04-26 ENCOUNTER — Encounter: Payer: Self-pay | Admitting: Family Medicine

## 2014-04-26 DIAGNOSIS — M8580 Other specified disorders of bone density and structure, unspecified site: Secondary | ICD-10-CM | POA: Insufficient documentation

## 2014-04-26 MED ORDER — CALCIUM CARBONATE-VITAMIN D 500-200 MG-UNIT PO TABS: 2.0000 | ORAL_TABLET | Freq: Two times a day (BID) | ORAL | Status: DC

## 2014-04-26 NOTE — Assessment & Plan Note (Signed)
Bone density scan revealed osteopenia with the most significant area of bone lossn the L hip.  Recommendations: patient must stop smoking as smoking contributes to ongoing accelerated bone loss, weight bearing exercise, calcium 1200 mg per day and vitamin D supplement 400-800 IU per day. Please plan to discuss in more detail at f/u appts.

## 2014-04-26 NOTE — Addendum Note (Signed)
Addended by: Boykin Nearing on: 04/26/2014 05:15 PM   Modules accepted: Orders

## 2014-05-08 ENCOUNTER — Ambulatory Visit (HOSPITAL_COMMUNITY)
Admission: RE | Admit: 2014-05-08 | Discharge: 2014-05-08 | Disposition: A | Discharge status: Home / Self Care | Payer: Medicaid Other | Source: Ambulatory Visit | Attending: Family Medicine | Admitting: Family Medicine

## 2014-05-08 DIAGNOSIS — R1011 Right upper quadrant pain: Secondary | ICD-10-CM | POA: Insufficient documentation

## 2014-05-08 DIAGNOSIS — R109 Unspecified abdominal pain: Secondary | ICD-10-CM

## 2014-05-16 ENCOUNTER — Telehealth: Payer: Self-pay | Admitting: *Deleted

## 2014-05-16 NOTE — Telephone Encounter (Signed)
Pt aware of US results. 

## 2014-05-16 NOTE — Telephone Encounter (Signed)
-----   Message from Minerva Ends, MD sent at 05/08/2014 11:27 AM EST ----- Normal abdominal ultrasound. No gallstones.

## 2014-08-18 ENCOUNTER — Telehealth: Payer: Self-pay | Admitting: *Deleted

## 2014-08-18 DIAGNOSIS — M7989 Other specified soft tissue disorders: Secondary | ICD-10-CM

## 2014-08-18 NOTE — Telephone Encounter (Signed)
Patient called in to say she had "fluid on her knee" and wanted Dr. Adrian Blackwater to "call her in "something.  I explained to the patient that she had not been seen in the clinic since 12/15 and that she would need to be seen.  I offered to make her an appointment with Dr. Adrian Blackwater and I also explained about our walk in clinic.  Patient did not agree to either and said thank you.

## 2014-08-22 ENCOUNTER — Ambulatory Visit (HOSPITAL_BASED_OUTPATIENT_CLINIC_OR_DEPARTMENT_OTHER): Payer: Medicaid Other | Admitting: Family Medicine

## 2014-08-22 ENCOUNTER — Ambulatory Visit (HOSPITAL_COMMUNITY)
Admission: RE | Admit: 2014-08-22 | Discharge: 2014-08-22 | Disposition: A | Discharge status: Home / Self Care | Payer: Medicaid Other | Source: Ambulatory Visit | Attending: Family Medicine | Admitting: Family Medicine

## 2014-08-22 VITALS — BP 110/74 | HR 80 | Temp 98.3°F | Resp 16

## 2014-08-22 DIAGNOSIS — M25562 Pain in left knee: Secondary | ICD-10-CM | POA: Diagnosis present

## 2014-08-22 DIAGNOSIS — M545 Low back pain, unspecified: Secondary | ICD-10-CM

## 2014-08-22 DIAGNOSIS — M25462 Effusion, left knee: Secondary | ICD-10-CM | POA: Diagnosis not present

## 2014-08-22 DIAGNOSIS — M7989 Other specified soft tissue disorders: Secondary | ICD-10-CM

## 2014-08-22 MED ORDER — TRAMADOL HCL 50 MG PO TABS
50.0000 mg | ORAL_TABLET | Freq: Three times a day (TID) | ORAL | Status: DC | PRN
Start: 2014-08-22 — End: 2014-09-10

## 2014-08-22 MED ORDER — MELOXICAM 15 MG PO TABS: 15.0000 mg | ORAL_TABLET | Freq: Every day | ORAL | Status: DC

## 2014-08-22 NOTE — Patient Instructions (Signed)
Use brace if this helps. Apply heat several times a day Take Mobic once a day. May take Tramadol at bedtime for pain and rest. See our financial department about an orange card or [facility] discount card. Go for x-ray.

## 2014-08-22 NOTE — Progress Notes (Signed)
Patient complains of left knee pain,pooing,swelling, stiffness for over a week. She reports taking advil and tylenol as well as Mobic for pain and swelling. Patient bought knee brace on Sunday and reports that it has helped just a little.  She reports the swelling reduces overnight some.  She is using some ice but reports she does not like to use ice.  Patient does not have insurance currently

## 2014-08-22 NOTE — Progress Notes (Signed)
Patient ID: BRITTNAY PIGMAN, female   DOB: [birth year], 48 y.o.   MRN: [id number]  CC:  "fluid on knee and pain"  HPI:  Patient presents with a 8 day history of left knee pain and swelling.  She denies any injury to the knee and denies any previous problems with this knee. She has used a few doses of Mobic and is wearing an elastic knee support. She reports lots of popping and pain of 8/10. The pain is disturbing her sleep.  She has used ice as advised by friends.   Exam:  The left knee is swollen medially and a little laterally with puffiness behind the knee. There is no ballotment. She has FROM with some discomfort.   Assessment:  Left knee pain and swelling  Plan:  X-ray of left knee Mobic 15 mg, # 30 one po q day Tramadol 50, #30. One at bedtime if needed for pain and rest. Referral to financial to apply for financial assistance. Referral to ortho once financial issues in place. Elastic sleeve if helpful and heat several times a day.  Ice after walking a lot.   Micheline Chapman, FNP-BC

## 2014-08-25 ENCOUNTER — Telehealth: Payer: Self-pay | Admitting: Family Medicine

## 2014-08-25 ENCOUNTER — Telehealth: Payer: Self-pay | Admitting: *Deleted

## 2014-08-25 NOTE — Telephone Encounter (Signed)
-----   Message from Micheline Chapman, NP sent at 08/25/2014  9:13 AM EDT ----- X-ray show arthritic changes and a tiny amount of fluid behind knee cap.  Follow-up as needed

## 2014-08-25 NOTE — Telephone Encounter (Signed)
Shared results of xray with patient and offered an appointment to follow up with Dr. Adrian Blackwater.  Patient states she is taking her anti-inflammatory medications and using her heat and ice pack.  Patient says she will call us back if she wants an appointment. Patient thankful and agreeable

## 2014-08-25 NOTE — Telephone Encounter (Signed)
Pt calling for x-ray results, please f/u with pt.

## 2014-08-29 NOTE — Telephone Encounter (Signed)
Order placed for xray of knee

## 2014-09-10 ENCOUNTER — Encounter (HOSPITAL_COMMUNITY): Payer: Self-pay | Admitting: Emergency Medicine

## 2014-09-10 ENCOUNTER — Emergency Department (HOSPITAL_COMMUNITY)
Admission: EM | Admit: 2014-09-10 | Discharge: 2014-09-10 | Disposition: A | Discharge status: Home / Self Care | Payer: Medicaid Other | Attending: Emergency Medicine | Admitting: Emergency Medicine

## 2014-09-10 DIAGNOSIS — H6692 Otitis media, unspecified, left ear: Secondary | ICD-10-CM | POA: Diagnosis not present

## 2014-09-10 DIAGNOSIS — Z79899 Other long term (current) drug therapy: Secondary | ICD-10-CM | POA: Diagnosis not present

## 2014-09-10 DIAGNOSIS — J3489 Other specified disorders of nose and nasal sinuses: Secondary | ICD-10-CM | POA: Diagnosis not present

## 2014-09-10 DIAGNOSIS — M1990 Unspecified osteoarthritis, unspecified site: Secondary | ICD-10-CM | POA: Diagnosis not present

## 2014-09-10 DIAGNOSIS — Z87440 Personal history of urinary (tract) infections: Secondary | ICD-10-CM | POA: Diagnosis not present

## 2014-09-10 DIAGNOSIS — H9202 Otalgia, left ear: Secondary | ICD-10-CM | POA: Diagnosis present

## 2014-09-10 DIAGNOSIS — M25562 Pain in left knee: Secondary | ICD-10-CM | POA: Insufficient documentation

## 2014-09-10 DIAGNOSIS — Z791 Long term (current) use of non-steroidal anti-inflammatories (NSAID): Secondary | ICD-10-CM | POA: Diagnosis not present

## 2014-09-10 DIAGNOSIS — Z720 Tobacco use: Secondary | ICD-10-CM | POA: Diagnosis not present

## 2014-09-10 MED ORDER — ACETAMINOPHEN-CODEINE #3 300-30 MG PO TABS: 1.0000 | ORAL_TABLET | Freq: Four times a day (QID) | ORAL | Status: DC | PRN

## 2014-09-10 MED ORDER — AZITHROMYCIN 250 MG PO TABS: 250.0000 mg | ORAL_TABLET | Freq: Every day | ORAL | Status: DC

## 2014-09-10 NOTE — ED Notes (Signed)
Pt c/o L sided ear pain since this morning. Pt sts she has been having generalized body aches since Thursday and has "not been feeling good." Pt denies N/V.  A&Ox4 and ambulatory. Pt also c/o R knee pain since diagnosis of arthritis in April.

## 2014-09-10 NOTE — Discharge Instructions (Signed)
Arthralgia °Your caregiver has diagnosed you as suffering from an arthralgia. Arthralgia means there is pain in a joint. This can come from many reasons including: °· Bruising the joint which causes soreness (inflammation) in the joint. °· Wear and tear on the joints which occur as we grow older (osteoarthritis). °· Overusing the joint. °· Various forms of arthritis. °· Infections of the joint. °Regardless of the cause of pain in your joint, most of these different pains respond to anti-inflammatory drugs and rest. The exception to this is when a joint is infected, and these cases are treated with antibiotics, if it is a bacterial infection. °HOME CARE INSTRUCTIONS  °· Rest the injured area for as long as directed by your caregiver. Then slowly start using the joint as directed by your caregiver and as the pain allows. Crutches as directed may be useful if the ankles, knees or hips are involved. If the knee was splinted or casted, continue use and care as directed. If an stretchy or elastic wrapping bandage has been applied today, it should be removed and re-applied every 3 to 4 hours. It should not be applied tightly, but firmly enough to keep swelling down. Watch toes and feet for swelling, bluish discoloration, coldness, numbness or excessive pain. If any of these problems (symptoms) occur, remove the ace bandage and re-apply more loosely. If these symptoms persist, contact your caregiver or return to this location. °· For the first 24 hours, keep the injured extremity elevated on pillows while lying down. °· Apply ice for 15-20 minutes to the sore joint every couple hours while awake for the first half day. Then 03-04 times per day for the first 48 hours. Put the ice in a plastic bag and place a towel between the bag of ice and your skin. °· Wear any splinting, casting, elastic bandage applications, or slings as instructed. °· Only take over-the-counter or prescription medicines for pain, discomfort, or fever as  directed by your caregiver. Do not use aspirin immediately after the injury unless instructed by your physician. Aspirin can cause increased bleeding and bruising of the tissues. °· If you were given crutches, continue to use them as instructed and do not resume weight bearing on the sore joint until instructed. °Persistent pain and inability to use the sore joint as directed for more than 2 to 3 days are warning signs indicating that you should see a caregiver for a follow-up visit as soon as possible. Initially, a hairline fracture (break in bone) may not be evident on X-rays. Persistent pain and swelling indicate that further evaluation, non-weight bearing or use of the joint (use of crutches or slings as instructed), or further X-rays are indicated. X-rays may sometimes not show a small fracture until a week or 10 days later. Make a follow-up appointment with your own caregiver or one to whom we have referred you. A radiologist (specialist in reading X-rays) may read your X-rays. Make sure you know how you are to obtain your X-ray results. Do not assume everything is normal if you do not hear from us. °SEEK MEDICAL CARE IF: °Bruising, swelling, or pain increases. °SEEK IMMEDIATE MEDICAL CARE IF:  °· Your fingers or toes are numb or blue. °· The pain is not responding to medications and continues to stay the same or get worse. °· The pain in your joint becomes severe. °· You develop a fever over 102° F (38.9° C). °· It becomes impossible to move or use the joint. °MAKE SURE YOU:  °·   Understand these instructions.  Will watch your condition.  Will get help right away if you are not doing well or get worse. Document Released: 04/28/2005 Document Revised: 07/21/2011 Document Reviewed: 12/15/2007 [facility] Patient Information 2015 [place], [place]. This information is not intended to replace advice given to you by your health care provider. Make sure you discuss any questions you have with your health care  provider.  Otitis Media Otitis media is redness, soreness, and puffiness (swelling) in the space just behind your eardrum (middle ear). It may be caused by allergies or infection. It often happens along with a cold. HOME CARE  Take your medicine as told. Finish it even if you start to feel better.  Only take over-the-counter or prescription medicines for pain, discomfort, or fever as told by your doctor.  Follow up with your doctor as told. GET HELP IF:  You have otitis media only in one ear, or bleeding from your nose, or both.  You notice a lump on your neck.  You are not getting better in 3-5 days.  You feel worse instead of better. GET HELP RIGHT AWAY IF:   You have pain that is not helped with medicine.  You have puffiness, redness, or pain around your ear.  You get a stiff neck.  You cannot move part of your face (paralysis).  You notice that the bone behind your ear hurts when you touch it. MAKE SURE YOU:   Understand these instructions.  Will watch your condition.  Will get help right away if you are not doing well or get worse. Document Released: 10/15/2007 Document Revised: 05/03/2013 Document Reviewed: 11/23/2012 [facility] Patient Information 2015 [place], [place]. This information is not intended to replace advice given to you by your health care provider. Make sure you discuss any questions you have with your health care provider.

## 2014-09-10 NOTE — ED Provider Notes (Signed)
CSN: [id number]     Arrival date & time 09/10/14  1906 History   First MD Initiated Contact with Patient 09/10/14 1915    This chart was scribed for non-physician practitioner, Cynthia Moras PA-C working with Cynthia Dakin, MD by Cynthia Duran, ED Scribe. This patient was seen in room WTR5/WTR5 and the patient's care was started at 7:20 PM.   Chief Complaint  Patient presents with  . Otalgia  . Generalized Body Aches   The history is provided by the patient. No language interpreter was used.    HPI Comments: Cynthia Duran is a 49 y.o. female with a PMHx of arthritis who presents to the Emergency Department complaining of constant, ongoing, unchanged generalized body aches x 4 days. Pt also reports cough and L sided otalgia. Pt mentions ongoing diarrhea after eating x 3 days. No OTC medications or home remedies attempted prior to arrival. No nausea, vomiting, abdominal pain, or SOB. She is an every day smoker.  Pt also reports constant, ongoing, unchanged R knee pain x 1 month. Pt is followed by [facility] for this concern and was recently diagnosed with arthritis. [name] was sent home with prescription for Tramadol and an antiinflammatory. However, pt states these medications do not provide any relief. No recent numbness, loss of sensation, or paresthesia. No known allergies to medications.  Past Medical History  Diagnosis Date  . Frequent UTI   . Arthritis Dx 2006   Past Surgical History  Procedure Laterality Date  . Cyst removed Right 2009     elbow, Dr. Silvio Duran    Family History  Problem Relation Age of Onset  . Arthritis Mother     Rhematoid  . COPD Mother   . Heart disease Father   . Hypertension Father   . Diabetes Maternal Grandmother   . Thyroid disease Maternal Grandmother   . Cancer Maternal Aunt     breast    History  Substance Use Topics  . Smoking status: Current Every Day Smoker -- 1.50 packs/day for 30 years  . Smokeless tobacco: Never Used  .  Alcohol Use: Yes     Comment: socially    OB History    No data available     Review of Systems  Constitutional: Negative for fever and chills.  HENT: Positive for ear pain. Negative for congestion.   Respiratory: Positive for cough. Negative for shortness of breath.   Cardiovascular: Negative for chest pain.  Gastrointestinal: Positive for diarrhea. Negative for nausea, vomiting and abdominal pain.  Musculoskeletal: Positive for myalgias and arthralgias. Negative for back pain and joint swelling.  Skin: Negative for rash.  Neurological: Negative for weakness and numbness.  Psychiatric/Behavioral: Negative for confusion.      Allergies  Review of patient's allergies indicates no known allergies.  Home Medications   Prior to Admission medications   Medication Sig Start Date End Date Taking? Authorizing Provider  Acetaminophen (ACETAMINOPHEN 8 HOUR) 650 MG TABS Take 650 mg by mouth 2 (two) times daily.    Historical Provider, MD  Biotin (PA BIOTIN) 1000 MCG tablet Take 1,000 mcg by mouth daily.    Historical Provider, MD  calcium-vitamin D (OSCAL WITH D) 500-200 MG-UNIT per tablet Take 2 tablets by mouth 2 (two) times daily. 04/26/14   Cynthia Funches, MD  cyclobenzaprine (FLEXERIL) 10 MG tablet Take 1 tablet (10 mg total) by mouth 3 (three) times daily as needed for muscle spasms. Patient not taking: Reported on 08/22/2014 03/27/14   Cynthia  Funches, MD  fluconazole (DIFLUCAN) 150 MG tablet Take 1 tablet (150 mg total) by mouth once. Patient not taking: Reported on 08/22/2014 04/19/14   Cynthia Nearing, MD  ibuprofen (ADVIL,MOTRIN) 200 MG tablet Take 800 mg by mouth 3 (three) times daily.    Historical Provider, MD  Magnesium 250 MG TABS Take 1 tablet by mouth daily.    Historical Provider, MD  meloxicam (MOBIC) 15 MG tablet Take 1 tablet (15 mg total) by mouth daily. 08/22/14   Cynthia Chapman, NP  Probiotic Product (TRUBIOTICS PO) Take 1 capsule by mouth daily.    Historical  Provider, MD  traMADol (ULTRAM) 50 MG tablet Take 1 tablet (50 mg total) by mouth every 8 (eight) hours as needed. 08/22/14   Cynthia Chapman, NP   Triage Vitals: BP 150/95 mmHg  Pulse 90  Temp(Src) 98.9 F (37.2 C) (Oral)  Resp 16  SpO2 100%  LMP 09/27/2007 (Exact Date)   Physical Exam  Constitutional: She is oriented to person, place, and time. She appears well-developed and well-nourished.  HENT:  Head: Normocephalic and atraumatic.  Right Ear: Hearing, tympanic membrane, external ear and ear canal normal.  Left Ear: Hearing and ear canal normal. Tympanic membrane is erythematous and bulging.  Nose: Rhinorrhea present.  Mouth/Throat: Uvula is midline, oropharynx is clear and moist and mucous membranes are normal.  L TM intact   Eyes: EOM are normal.  Neck: Normal range of motion.  Cardiovascular: Normal rate, regular rhythm and normal heart sounds.   No murmur heard. Pulmonary/Chest: Effort normal and breath sounds normal. She has no wheezes. She has no rales.  No rhonchi noted  Abdominal: She exhibits no distension.  Musculoskeletal: Normal range of motion.  L KNEE: Medial lateral joint line with normal knee flexion and extension No joint laxity Negative anterior and posterior drawer test Normal varus and valgus maneuver  No overlying skin changes No erythema   Lymphadenopathy:    She has cervical adenopathy.  Neurological: She is alert and oriented to person, place, and time.  Psychiatric: She has a normal mood and affect.  Nursing note and vitals reviewed.   ED Course  Procedures (including critical care time)  DIAGNOSTIC STUDIES: Oxygen Saturation is 100% on RA, Normal by my interpretation.    COORDINATION OF CARE: 7:29 PM- Will discharge home with Tylenol #3 and Zithromax. Discussed treatment plan with pt at bedside and pt agreed to plan.     Labs Review Labs Reviewed - No data to display  Imaging Review No results found.   EKG Interpretation None       MDM   Final diagnoses:  Acute left otitis media, recurrence not specified, unspecified otitis media type  Arthralgia of left knee    BP 150/95 mmHg  Pulse 90  Temp(Src) 98.9 F (37.2 C) (Oral)  Resp 16  SpO2 100%  LMP 09/27/2007 (Exact Date)   I personally performed the services described in this documentation, which was scribed in my presence. The recorded information has been reviewed and is accurate.    Cynthia Moras, PA-C 09/10/14 1938  Cynthia Dakin, MD 09/10/14 [phone]

## 2014-09-11 ENCOUNTER — Telehealth: Payer: Self-pay | Admitting: *Deleted

## 2014-09-11 ENCOUNTER — Ambulatory Visit: Payer: Medicaid Other | Attending: Family Medicine | Admitting: Family Medicine

## 2014-09-11 ENCOUNTER — Telehealth: Payer: Self-pay

## 2014-09-11 VITALS — BP 100/70 | HR 96 | Temp 98.7°F | Wt 108.8 lb

## 2014-09-11 DIAGNOSIS — H6692 Otitis media, unspecified, left ear: Secondary | ICD-10-CM | POA: Diagnosis not present

## 2014-09-11 MED ORDER — OFLOXACIN 0.3 % OT SOLN: 10.0000 [drp] | Freq: Two times a day (BID) | OTIC | Status: DC

## 2014-09-11 NOTE — Telephone Encounter (Signed)
Patient seen in [facility] emergency room on 09/10/14  And diagnosed with otitis media.  Patient called in because she has noticed some blood draining from her ear and was concerned.  I instructed her to come to the walk in clinic and gave her hours of operation.

## 2014-09-11 NOTE — Progress Notes (Addendum)
Patient ID: Cynthia Duran, female   DOB: [birth year], 49 y.o.   MRN: [id number]   HPI:  Patient presents today for blood draining from her left ear. She developed some upper respiratory symptoms 4-5 days ago with nasal congestion, cough and ears feeling congested. Yesterday she developed an earache and when to the ED. She was diagnosed with left otitis media and prescribed zithromax and tylenol #3. Last night she awaken to some blood draining from the ear. She denies fever or chills. Continues with cough which increased pain in ear.  ROS:  General:  Denies fever, chills, sweating HENT:  Admits to nasal congestion, ear ache, ST resolved. Lungs:  Denies SOB, admits to cough Heart:  Denies chest pain or palpitations.  EXAM:  She is alert, oriented, appropriate, appears tired and uncomfortable There is purulent drainage and a small amount of blood in the left ear canal. Due to drainage occuding the drum, I am unable to see if there is a rupture. Right ear w/i normal limits. Her throat is minorly inflammed w/o swelling or exudate. Nose is minorly congested. Neck is supple FROM without adenopathy. Lungs are clear to auscultation, Heart Sound are regular.  ASSESSMENT:  Viral URI with left otitis media. Possible ruptured ear drum  Plan:  Medications as prescribed in ED Floxin otic suspension, 10 drops into left ear canal bid for 14 days. Keep water out of ear Do not put anything in ear. May use cottonball to collect drainage Follow-up in 7-10 days with me for a recheck of ear, sooner if symptoms worsen or are not improving in 3-5 days.   Micheline Chapman, [facility]

## 2014-09-11 NOTE — Addendum Note (Signed)
Addended by: Micheline Chapman on: 09/11/2014 11:49 AM   Modules accepted: Orders

## 2014-09-11 NOTE — Patient Instructions (Addendum)
Do not put anything, including water or other liquids in ear. Can put a small wad of cotton and tape in place to catch any drainage. Use tylenol # 3 for pain and take antibiotic as prescribed in ED. Follow-up if pain not beginning to resolve in 3 days. Follow-up in 7-10 days for recheck to see if the eardrum has ruptured. If so, it will heal. But avoid getting water into ear with showering.   Apply 10 drops into left ear twice a day for 14 days.  I think the drops come in little individual bottles and the whole bottle goes in ear.

## 2014-09-11 NOTE — Telephone Encounter (Signed)
Informed patient that an ear drop had been sent to the pharmacy to help her get pain relief quicker.

## 2014-09-12 ENCOUNTER — Encounter: Payer: Self-pay | Admitting: Family Medicine

## 2014-09-12 ENCOUNTER — Encounter: Payer: Self-pay | Admitting: *Deleted

## 2014-09-12 NOTE — Progress Notes (Signed)
Patient ID: Cynthia Duran, female   DOB: 06-07-1965, 49 y.o.   MRN: [id number]    Patient called in because she is still feeling poorly with her otitis media.  She complains the right ear is starting to hurt as well.  She also states the ear drop Sharon Seller prescribed to her she could not afford as her insurance denied it.    I spoke to [name] who instructed patient to finish her Zithromycin prescription.  If she does not feel any better by Thursday she is to call us back so that a different antibiotic can be prescribed.  Patient was told to continue taking her prescription for pain as instructed in the bottle and to place cotton balls in both ears.  Patient in agreement.

## 2014-09-12 NOTE — Progress Notes (Signed)
Prior authorization for ofloxacin ear drops requested and done via cover my meds.

## 2014-09-14 ENCOUNTER — Telehealth: Payer: Self-pay

## 2014-09-14 NOTE — Telephone Encounter (Signed)
[facility] called regarding ear drop rx.  Per pharmacy they do not make ofloxacin for ears anymore but they can substitute if for the eye drop.  Per Sharon Seller, NP this is ok to substitute.  Pharmacy aware.

## 2014-09-18 ENCOUNTER — Ambulatory Visit: Payer: Medicaid Other

## 2014-09-19 ENCOUNTER — Encounter: Payer: Self-pay | Admitting: Family Medicine

## 2014-09-19 ENCOUNTER — Ambulatory Visit: Payer: Medicaid Other | Attending: Family Medicine | Admitting: Family Medicine

## 2014-09-19 VITALS — BP 116/76 | HR 80 | Temp 98.7°F | Resp 18 | Ht 60.0 in | Wt 108.0 lb

## 2014-09-19 DIAGNOSIS — Z720 Tobacco use: Secondary | ICD-10-CM | POA: Insufficient documentation

## 2014-09-19 DIAGNOSIS — M179 Osteoarthritis of knee, unspecified: Secondary | ICD-10-CM | POA: Diagnosis not present

## 2014-09-19 DIAGNOSIS — F17200 Nicotine dependence, unspecified, uncomplicated: Secondary | ICD-10-CM

## 2014-09-19 DIAGNOSIS — M1990 Unspecified osteoarthritis, unspecified site: Secondary | ICD-10-CM | POA: Insufficient documentation

## 2014-09-19 DIAGNOSIS — H6092 Unspecified otitis externa, left ear: Secondary | ICD-10-CM | POA: Insufficient documentation

## 2014-09-19 DIAGNOSIS — M1712 Unilateral primary osteoarthritis, left knee: Secondary | ICD-10-CM

## 2014-09-19 MED ORDER — TRAMADOL HCL 50 MG PO TABS
50.0000 mg | ORAL_TABLET | Freq: Three times a day (TID) | ORAL | Status: DC | PRN
Start: 2014-09-19 — End: 2014-10-12

## 2014-09-19 MED ORDER — VARENICLINE TARTRATE 1 MG PO TABS: 1.0000 mg | ORAL_TABLET | Freq: Two times a day (BID) | ORAL | Status: DC

## 2014-09-19 MED ORDER — VARENICLINE TARTRATE 0.5 MG X 11 & 1 MG X 42 PO MISC: ORAL | Status: DC

## 2014-09-19 MED ORDER — METHYLPREDNISOLONE ACETATE 40 MG/ML IJ SUSP
40.0000 mg | Freq: Once | INTRAMUSCULAR | Status: AC
  Administered 2014-09-19: 40 mg via INTRA_ARTICULAR

## 2014-09-19 MED ORDER — CARBAMIDE PEROXIDE 6.5 % OT SOLN: 5.0000 [drp] | Freq: Two times a day (BID) | OTIC | Status: DC

## 2014-09-19 NOTE — Assessment & Plan Note (Signed)
A: smoking and desires to quit P: chantix ordered Counseling provided

## 2014-09-19 NOTE — Assessment & Plan Note (Signed)
L ear infection: No ongoing infection Debrox for wax  F/u in 1-2 weeks with RN for ear irrigation (wax clean out)

## 2014-09-19 NOTE — Progress Notes (Signed)
F/U ED bleeding on Lt ear No dizziness, pain scale 2

## 2014-09-19 NOTE — Assessment & Plan Note (Signed)
L knee pain with swelling due to osteoarthritis: You have received a shot of steroid in your joint today. Rest and ice knee today. Regular activity tomorrow. Look out for redness, swelling, fever,severe pain in joint and call if you experience these symptoms.  Tramadol refilled Ortho referral

## 2014-09-19 NOTE — Patient Instructions (Signed)
Cynthia Duran,  Thank you for coming in today.   1. L knee pain with swelling due to osteoarthritis: You have received a shot of steroid in your joint today. Rest and ice knee today. Regular activity tomorrow. Look out for redness, swelling, fever,severe pain in joint and call if you experience these symptoms.  Tramadol refilled Ortho referral  2. L ear infection: No ongoing infection Debrox for wax  F/u in 1-2 weeks with RN for ear irrigation (wax clean out) F/u with me in 2 months for osteoarthritis   Dr. Adrian Blackwater

## 2014-09-19 NOTE — Progress Notes (Signed)
   Subjective:    Patient ID: Cynthia Duran, female    DOB: [birth year], 49 y.o.   MRN: [id number] CC: L otitis externa, L knee pain, smoking cessation  HPI  1. L otitis externa: improving. Still feels some pressure and decreased hearing. No fever or ear discharge. Did not use ofloxacin drops bc they were opthalmic drops.   2. L knee pain: x 1 month with swelling. Compression sleeve helps. Pain is still significant. Had L knee x-ray on 08/22/14: Very mild tricompartment degenerative changes with tiny joint effusion.  3. Smoking: desires to quit. Has some SOB and decreased exercise tolerance.    Soc Hx: current smoker, desires to quit  Review of Systems  HENT: Negative for ear discharge and ear pain.        Ear pressure and decreased hearing L ear   Musculoskeletal: Positive for joint swelling and arthralgias.      Objective:   Physical Exam BP 116/76 mmHg  Pulse 80  Temp(Src) 98.7 F (37.1 C) (Oral)  Resp 18  Ht 5' (1.524 m)  Wt 108 lb (48.988 kg)  BMI 21.09 kg/m2  SpO2 95%  LMP 09/27/2007 (Exact Date) General appearance: alert, cooperative and no distress Ears: normal TM and external ear canal right ear and abnormal external canal left ear - ceruminosis impacting canal and debris in canal Nose: Nares normal. Septum midline. Mucosa normal. No drainage or sinus tenderness. Extremities: L knee with suprapatellar effusion, small, no erythema. Limited extension. TTP medial knee   After obtaining informed consent and cleaning the skin using iodine and alcohol a  steroid injection was performed at L knee using 2% plain Lidocaine and 40 mg of Depo Medrol. This was well tolerated       Assessment & Plan:

## 2014-10-03 ENCOUNTER — Ambulatory Visit: Payer: Medicaid Other | Attending: Family Medicine | Admitting: *Deleted

## 2014-10-03 DIAGNOSIS — H6122 Impacted cerumen, left ear: Secondary | ICD-10-CM

## 2014-10-03 NOTE — Progress Notes (Unsigned)
Patient came in for ear irrigation per Dr. Adrian Blackwater.  Patient tolerated well and stated she could hear better.  Patient asking about cortisone knee injection and advised to make appointment at front desk on her way out.

## 2014-10-12 ENCOUNTER — Ambulatory Visit: Payer: Medicaid Other | Attending: Family Medicine | Admitting: Family Medicine

## 2014-10-12 DIAGNOSIS — M1712 Unilateral primary osteoarthritis, left knee: Secondary | ICD-10-CM

## 2014-10-12 DIAGNOSIS — M179 Osteoarthritis of knee, unspecified: Secondary | ICD-10-CM

## 2014-10-12 MED ORDER — TRAMADOL HCL 50 MG PO TABS: 50.0000 mg | ORAL_TABLET | Freq: Three times a day (TID) | ORAL | Status: DC | PRN

## 2014-10-12 NOTE — Progress Notes (Signed)
   Subjective:    Patient ID: Cynthia Duran, female    DOB: 05-26-1965, 49 y.o.   MRN: [id number] CC:  F/u L knee pain  HPI  1. L knee pain: improvement with steroid injection. Requesting another injection as pain is in medial knee joint and patient feels that the injection.   Soc Hx: current smoker  Review of Systems  Constitutional: Negative for fever and chills.  Musculoskeletal: Positive for arthralgias and gait problem. Negative for myalgias, back pain, joint swelling, neck pain and neck stiffness.      Objective:   Physical Exam BP 107/70 mmHg  Pulse 79  Temp(Src) 98.2 F (36.8 C) (Oral)  Resp 16  Ht 5' (1.524 m)  Wt 106 lb 6.4 oz (48.263 kg)  BMI 20.78 kg/m2  SpO2 96%  LMP 09/27/2007 (Exact Date) General appearance: alert, cooperative and no distress Extremities: L knee with mild lateral swelling, no redness, full ROM.      Assessment & Plan:

## 2014-10-12 NOTE — Progress Notes (Signed)
F/u knee pain Stated inj works well requesting 2nd inj.

## 2014-10-12 NOTE — Patient Instructions (Signed)
Ms. Leech,  Thank you for coming in today  1. OA in L knee: Tramadol script Repeat injection in 3 more weeks ( at least 6 weeks apart) Ortho referral, back to Dr. Lynann Bologna  F/u in 3 weeks with me  Dr. Adrian Blackwater

## 2014-10-13 ENCOUNTER — Encounter: Payer: Self-pay | Admitting: Family Medicine

## 2014-10-13 NOTE — Assessment & Plan Note (Signed)
OA in L knee: Tramadol script Repeat injection in 3 more weeks ( at least 6 weeks apart) Ortho referral, back to Dr. Lynann Bologna

## 2015-05-24 ENCOUNTER — Telehealth: Payer: Self-pay | Admitting: *Deleted

## 2015-05-24 ENCOUNTER — Ambulatory Visit: Payer: Medicaid Other | Attending: Family Medicine | Admitting: Family Medicine

## 2015-05-24 ENCOUNTER — Encounter: Payer: Self-pay | Admitting: Family Medicine

## 2015-05-24 ENCOUNTER — Other Ambulatory Visit (HOSPITAL_COMMUNITY)
Admission: RE | Admit: 2015-05-24 | Discharge: 2015-05-24 | Disposition: A | Discharge status: Home / Self Care | Payer: Medicaid Other | Source: Ambulatory Visit | Attending: Family Medicine | Admitting: Family Medicine

## 2015-05-24 VITALS — BP 106/78 | HR 82 | Temp 98.2°F | Resp 16 | Ht 60.0 in | Wt 107.0 lb

## 2015-05-24 DIAGNOSIS — N898 Other specified noninflammatory disorders of vagina: Secondary | ICD-10-CM | POA: Diagnosis not present

## 2015-05-24 DIAGNOSIS — R5383 Other fatigue: Secondary | ICD-10-CM | POA: Diagnosis present

## 2015-05-24 DIAGNOSIS — Z0000 Encounter for general adult medical examination without abnormal findings: Secondary | ICD-10-CM

## 2015-05-24 DIAGNOSIS — Z79899 Other long term (current) drug therapy: Secondary | ICD-10-CM | POA: Insufficient documentation

## 2015-05-24 DIAGNOSIS — A499 Bacterial infection, unspecified: Secondary | ICD-10-CM

## 2015-05-24 DIAGNOSIS — N760 Acute vaginitis: Secondary | ICD-10-CM

## 2015-05-24 DIAGNOSIS — R5382 Chronic fatigue, unspecified: Secondary | ICD-10-CM | POA: Insufficient documentation

## 2015-05-24 DIAGNOSIS — D280 Benign neoplasm of vulva: Secondary | ICD-10-CM | POA: Insufficient documentation

## 2015-05-24 DIAGNOSIS — D229 Melanocytic nevi, unspecified: Secondary | ICD-10-CM | POA: Insufficient documentation

## 2015-05-24 DIAGNOSIS — F17210 Nicotine dependence, cigarettes, uncomplicated: Secondary | ICD-10-CM | POA: Diagnosis not present

## 2015-05-24 DIAGNOSIS — Z6820 Body mass index (BMI) 20.0-20.9, adult: Secondary | ICD-10-CM | POA: Diagnosis not present

## 2015-05-24 DIAGNOSIS — B9689 Other specified bacterial agents as the cause of diseases classified elsewhere: Secondary | ICD-10-CM

## 2015-05-24 LAB — POCT GLYCOSYLATED HEMOGLOBIN (HGB A1C): HEMOGLOBIN A1C: 5.4

## 2015-05-24 MED ORDER — VITAMIN B-12 1000 MCG PO TABS: 1000.0000 ug | ORAL_TABLET | Freq: Every day | ORAL | Status: DC

## 2015-05-24 NOTE — Assessment & Plan Note (Signed)
I suspect that your fatigue is related to depression and poor sleep. Patient has declined to speak with CSW and declined antidepressant at this time  You labs from 2015 were all normal with no anemia, vit D deficiency or abnormal thyroid function   I recommend a sedating antidepressant like elavil or trazodone that you can take at bedtime  Try to get outdoors during the day,  Exercise regularly  Continue to work to quit smoking

## 2015-05-24 NOTE — Progress Notes (Signed)
Patient ID: Cynthia Duran, female   DOB: Oct 02, 1965, 50 y.o.   MRN: [id number]   Subjective:  Patient ID: Cynthia Duran, female    DOB: [birth year]  Age: 50 y.o. MRN: [id number]  CC: Nevus   HPI ROBBYE SPANGLER presents for    1. Mole: new mole in labia. Non tender. No rash. Hx of tanning. No hx of skin cancer.    2. Fatigue: chronic, worsening. Sleeps 5 hrs per night. Feels hopeless and overwhelmed frequently. Denies CP, SOB, dizziness. Smokes.   3. Vaginal discharge: comes and goes. Comes after she uses a dildo. No itching or odor now.   Social History  Substance Use Topics  . Smoking status: Current Every Day Smoker -- 1.50 packs/day for 30 years  . Smokeless tobacco: Never Used  . Alcohol Use: Yes     Comment: socially     Outpatient Prescriptions Prior to Visit  Medication Sig Dispense Refill  . Biotin (PA BIOTIN) 1000 MCG tablet Take 1,000 mcg by mouth daily.    . Magnesium 250 MG TABS Take 1 tablet by mouth daily.    . Probiotic Product (TRUBIOTICS PO) Take 1 capsule by mouth daily.    . calcium-vitamin D (OSCAL WITH D) 500-200 MG-UNIT per tablet Take 2 tablets by mouth 2 (two) times daily. (Patient not taking: Reported on 05/24/2015) 120 tablet 11  . carbamide peroxide (DEBROX) 6.5 % otic solution Place 5 drops into the left ear 2 (two) times daily. For 5 days (Patient not taking: Reported on 05/24/2015) 15 mL 0  . ibuprofen (ADVIL,MOTRIN) 200 MG tablet Take 800 mg by mouth 3 (three) times daily. Reported on 05/24/2015    . meloxicam (MOBIC) 15 MG tablet Take 1 tablet (15 mg total) by mouth daily. (Patient not taking: Reported on 05/24/2015) 30 tablet 1  . traMADol (ULTRAM) 50 MG tablet Take 1 tablet (50 mg total) by mouth every 8 (eight) hours as needed. (Patient not taking: Reported on 05/24/2015) 30 tablet 0  . varenicline (CHANTIX CONTINUING MONTH PAK) 1 MG tablet Take 1 tablet (1 mg total) by mouth 2 (two) times daily. (Patient not taking: Reported on 10/12/2014) 60  tablet 1  . varenicline (CHANTIX STARTING MONTH PAK) 0.5 MG X 11 & 1 MG X 42 tablet Per packet insert (Patient not taking: Reported on 10/12/2014) 53 tablet 0   No facility-administered medications prior to visit.    ROS Review of Systems  Constitutional: Positive for fatigue. Negative for fever and chills.  Eyes: Negative for visual disturbance.  Respiratory: Negative for shortness of breath.   Cardiovascular: Negative for chest pain.  Gastrointestinal: Negative for abdominal pain and blood in stool.  Musculoskeletal: Negative for back pain and arthralgias.  Skin: Positive for color change. Negative for rash.       New mole   Allergic/Immunologic: Negative for immunocompromised state.  Hematological: Negative for adenopathy. Does not bruise/bleed easily.  Psychiatric/Behavioral: Positive for sleep disturbance. Negative for suicidal ideas and dysphoric mood.    Objective:  BP 106/78 mmHg  Pulse 82  Temp(Src) 98.2 F (36.8 C) (Oral)  Resp 16  Ht 5' (1.524 m)  Wt 107 lb (48.535 kg)  BMI 20.90 kg/m2  SpO2 83%  LMP 09/27/2007 (Exact Date)  BP/Weight 05/24/2015 10/12/2014 [id number]  Systolic BP [id number]  Diastolic BP 78 70 76  Wt. (Lbs) 107 106.4 108  BMI 20.9 20.78 21.09   Physical Exam  Constitutional: She is oriented to person, place, and time.  She appears well-developed and well-nourished. No distress.  HENT:  Head: Normocephalic and atraumatic.  Cardiovascular: Normal rate, regular rhythm, normal heart sounds and intact distal pulses.   Pulmonary/Chest: Effort normal.  Genitourinary: Uterus normal.    Pelvic exam was performed with patient prone. There is no rash, tenderness or lesion on the right labia. There is no rash, tenderness or lesion on the left labia. Cervix exhibits no motion tenderness, no discharge and no friability. Vaginal discharge (thin white vaginal discharge ) found.  Musculoskeletal: She exhibits no edema.  Lymphadenopathy:       Right: No inguinal  adenopathy present.       Left: No inguinal adenopathy present.  Neurological: She is alert and oriented to person, place, and time.  Skin: Skin is warm and dry. No rash noted.  Psychiatric: She has a normal mood and affect.    Lab Results  Component Value Date   HGBA1C 5.40 05/24/2015   Depression screen [facility] 2/9 05/24/2015 03/27/2014  Decreased Interest 1 1  Down, Depressed, Hopeless 1 1  PHQ - 2 Score 2 2  Altered sleeping 3 1  Tired, decreased energy 3 3  Change in appetite 1 1  Feeling bad or failure about yourself  1 0  Trouble concentrating 1 2  Moving slowly or fidgety/restless 0 0  Suicidal thoughts 0 0  PHQ-9 Score 11 9    GAD 7 : Generalized Anxiety Score 05/24/2015  Nervous, Anxious, on Edge 0  Control/stop worrying 1  Worry too much - different things 1  Trouble relaxing 1  Restless 0  Easily annoyed or irritable 0  Afraid - awful might happen 0  Total GAD 7 Score 3       Assessment & Plan:   Problem List Items Addressed This Visit    Benign mole   Relevant Orders   Ambulatory referral to Dermatology   Chronic fatigue   Relevant Medications   vitamin B-12 (CYANOCOBALAMIN) 1000 MCG tablet    Other Visit Diagnoses    Healthcare maintenance    -  Primary    Relevant Orders    POCT glycosylated hemoglobin (Hb A1C) (Completed)    Vaginal discharge        Relevant Orders    Cervicovaginal ancillary only       No orders of the defined types were placed in this encounter.    Follow-up: No Follow-up on file.   Boykin Nearing MD

## 2015-05-24 NOTE — Progress Notes (Signed)
C/C mold on vaginal area, possible yeast infection  No pain today  Tobacco user 21 cigarette per day  No suicidal thought is the past two weeks

## 2015-05-24 NOTE — Telephone Encounter (Signed)
Patient verified DOB Patient was seen today and forgot to mention her need for a mammogram referral. Please follow-up with the patient once the referral has been placed and provide her with the contact information to schedule the appointment.

## 2015-05-24 NOTE — Patient Instructions (Addendum)
Cynthia Duran was seen today for nevus.  Diagnoses and all orders for this visit:  Healthcare maintenance -     POCT glycosylated hemoglobin (Hb A1C)  Chronic fatigue -     vitamin B-12 (CYANOCOBALAMIN) 1000 MCG tablet; Take 1 tablet (1,000 mcg total) by mouth daily.  Benign mole -     Ambulatory referral to Dermatology   I suspect that your fatigue is related to depression and poor sleep You labs from 2015 were all normal with no anemia, vit D deficiency or abnormal thyroid function   I recommend a sedating antidepressant like elavil or trazodone that you can take at bedtime  Try to get outdoors during the day,  Exercise regularly  Continue to work to quit smoking  F/u in 3 months for fatigue/depression, sooner if needed   Dr. Adrian Blackwater

## 2015-05-25 LAB — CERVICOVAGINAL ANCILLARY ONLY: Wet Prep (BD Affirm): POSITIVE — AB

## 2015-05-25 MED ORDER — METRONIDAZOLE 500 MG PO TABS: 500.0000 mg | ORAL_TABLET | Freq: Two times a day (BID) | ORAL | Status: DC

## 2015-05-25 NOTE — Addendum Note (Signed)
Addended by: Boykin Nearing on: 05/25/2015 12:10 PM   Modules accepted: Orders

## 2015-05-25 NOTE — Telephone Encounter (Signed)
-----   Message from Boykin Nearing, MD sent at 05/25/2015 12:09 PM EST ----- BV on wet prep Sent in flagyl

## 2015-05-25 NOTE — Telephone Encounter (Signed)
Date of birth verified  Lab results given  [facility] Pt verbalized understanding  Referral send pt aware

## 2015-05-30 ENCOUNTER — Encounter: Payer: Self-pay | Admitting: Family Medicine

## 2015-05-30 ENCOUNTER — Ambulatory Visit (INDEPENDENT_AMBULATORY_CARE_PROVIDER_SITE_OTHER): Payer: Medicaid Other | Admitting: Family Medicine

## 2015-05-30 VITALS — BP 97/73 | HR 76 | Temp 98.1°F | Ht 60.0 in | Wt 108.0 lb

## 2015-05-30 DIAGNOSIS — I781 Nevus, non-neoplastic: Secondary | ICD-10-CM

## 2015-05-30 DIAGNOSIS — D229 Melanocytic nevi, unspecified: Secondary | ICD-10-CM

## 2015-05-30 NOTE — Assessment & Plan Note (Signed)
-   Reassured patient that both areas appear normal and require no intervention at this time. - Advised patient to check areas monthly for changes. - Counseled patient that continued use of tanning beds greatly increases her risk of skin cancer. - Provided handout on monitoring skin lesions for change.

## 2015-05-30 NOTE — Addendum Note (Signed)
Addended by: Andrena Mews T on: 05/30/2015 04:59 PM   Modules accepted: Level of Service

## 2015-05-30 NOTE — Progress Notes (Signed)
Subjective:     Patient ID: Cynthia Duran, female   DOB: [birth year], 50 y.o.   MRN: [id number]  HPI  Moles on Genitalia: Cynthia Duran is a 44-y.o. female who presents with concerns about new skin findings on her genitalia. She has several friends who have been told they have skin cancer, so she started to monitor her skin more closely. She noted a square mole of her inner thigh last year, which she says has been getting larger. She noticed a dark area "on the wall of [her] vagina" about 6 months ago. These areas do not bother her or embarrass her, but she wanted to make sure they looked okay. She has a history of going to the tanning bed daily for 4 years. She has not been able to go lately but intends to continue, as she likes the sensation of warmth and feels it helps her arthritis. She is not currently sexually active.  Review of Systems  Constitutional: Negative for fever and unexpected weight change.   Active smoker.     Objective: Blood pressure 97/73, pulse 76, temperature 98.1 F (36.7 C), temperature source Oral, height 5' (1.524 m), weight 108 lb (48.988 kg), last menstrual period 09/27/2007.    Physical Exam  Constitutional: She appears well-developed and well-nourished. No distress.  Skin:  0.5x0.5 nevus of R labia majora; slightly hyperpigmented area below clitoris at opening of vagina on right      Assessment:     Cynthia Duran is a 7-y.o. with concern about new lesions in her genital region. The area near her clitoris appears to be normal variation in skin, and area on labia majora appears to be a nevus.     Plan:     Benign mole - Reassured patient that both areas appear normal and require no intervention at this time. - Advised patient to check areas monthly for changes. - Counseled patient that continued use of tanning beds greatly increases her risk of skin cancer. - Provided handout on monitoring skin lesions for change.       Cynthia Floss, MD [facility], PGY-1

## 2015-05-30 NOTE — Patient Instructions (Signed)
Thank you for coming in, Cynthia Duran.  Both of the areas you mentioned look normal. Please continue to monitor for changes in size, color and for any new irritation. Checking every month should be fine. You may want to take a photo for your reference.  Best, Dr. Margaretmary Lombard Moles are usually harmless growths on the skin. They are accumulations of color (pigment) cells in the skin that:   Can be various colors, from light brown to black.  Can appear anywhere on the body.  May remain flat or become raised.  May contain hairs.  May remain smooth or develop wrinkling. Most moles are not cancerous (benign). However, some moles may develop changes and become cancerous. It is important to check your moles every month. If you check your moles regularly, you will be able to notice any changes that may occur.  CAUSES  Moles occur when skin cells grow together in clusters instead of spreading out in the skin as they normally do. The reason for this clustering is unknown. DIAGNOSIS  Your caregiver will perform a skin examination to diagnose your mole.  TREATMENT  Moles usually do not require treatment. If a mole becomes worrisome, your caregiver may choose to take a sample of the mole or remove it entirely, and then send it to a lab for examination.  HOME CARE INSTRUCTIONS  Check your mole(s) monthly for changes that may indicate skin cancer. These changes can include:  A change in size.  A change in color. Note that moles tend to darken during pregnancy or when taking birth control pills (oral contraception).  A change in shape.  A change in the border of the mole.  Wear sunscreen (with an SPF of at least 86) when you spend long periods of time outside. Reapply the sunscreen every 2-3 hours.  Schedule annual appointments with your skin doctor (dermatologist) if you have a large number of moles. SEEK MEDICAL CARE IF:  Your mole changes size, especially if it becomes larger than a  pencil eraser.  Your mole changes in color or develops more than one color.  Your mole becomes itchy or bleeds.  Your mole, or the skin near the mole, becomes painful, sore, red, or swollen.  Your mole becomes scaly, sheds skin, or oozes fluid.  Your mole develops irregular borders.  Your mole becomes flat or develops raised areas.  Your mole becomes hard or soft.   This information is not intended to replace advice given to you by your health care provider. Make sure you discuss any questions you have with your health care provider.   Document Released: 01/21/2001 Document Revised: 01/21/2012 Document Reviewed: 11/10/2011 Elsevier Interactive Patient Education [employer].

## 2015-06-21 ENCOUNTER — Other Ambulatory Visit: Payer: Self-pay | Admitting: Family Medicine

## 2015-06-21 DIAGNOSIS — Z1231 Encounter for screening mammogram for malignant neoplasm of breast: Secondary | ICD-10-CM

## 2015-07-06 ENCOUNTER — Ambulatory Visit
Admission: RE | Admit: 2015-07-06 | Discharge: 2015-07-06 | Disposition: A | Discharge status: Home / Self Care | Payer: Medicaid Other | Source: Ambulatory Visit | Attending: Family Medicine | Admitting: Family Medicine

## 2015-07-06 DIAGNOSIS — Z1231 Encounter for screening mammogram for malignant neoplasm of breast: Secondary | ICD-10-CM

## 2015-07-10 ENCOUNTER — Encounter: Payer: Self-pay | Admitting: Clinical

## 2015-07-10 NOTE — Progress Notes (Signed)
Depression screen [facility] 2/9 05/24/2015 03/27/2014  Decreased Interest 1 1  Down, Depressed, Hopeless 1 1  PHQ - 2 Score 2 2  Altered sleeping 3 1  Tired, decreased energy 3 3  Change in appetite 1 1  Feeling bad or failure about yourself  1 0  Trouble concentrating 1 2  Moving slowly or fidgety/restless 0 0  Suicidal thoughts 0 0  PHQ-9 Score 11 9    GAD 7 : Generalized Anxiety Score 05/24/2015  Nervous, Anxious, on Edge 0  Control/stop worrying 1  Worry too much - different things 1  Trouble relaxing 1  Restless 0  Easily annoyed or irritable 0  Afraid - awful might happen 0  Total GAD 7 Score 3

## 2015-08-18 IMAGING — US US ABDOMEN COMPLETE
1 series · 13 of 25 positions shown · non-contrast
Comparison: None.

CLINICAL DATA: Intermittent right upper quadrant abdominal pain
over the past several months.

EXAM:
ULTRASOUND ABDOMEN COMPLETE

[Series 1: us abdomen complete · 0.18mm/px · 13 of 80 slices shown]
[im 1/80]
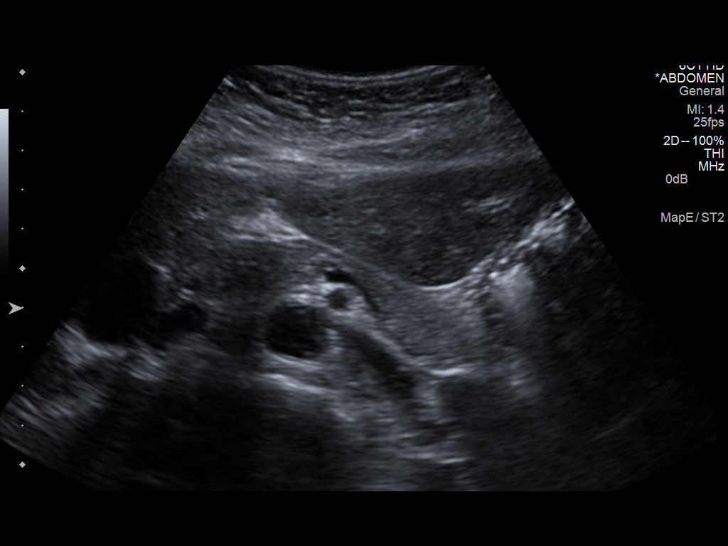
[im 7/80]
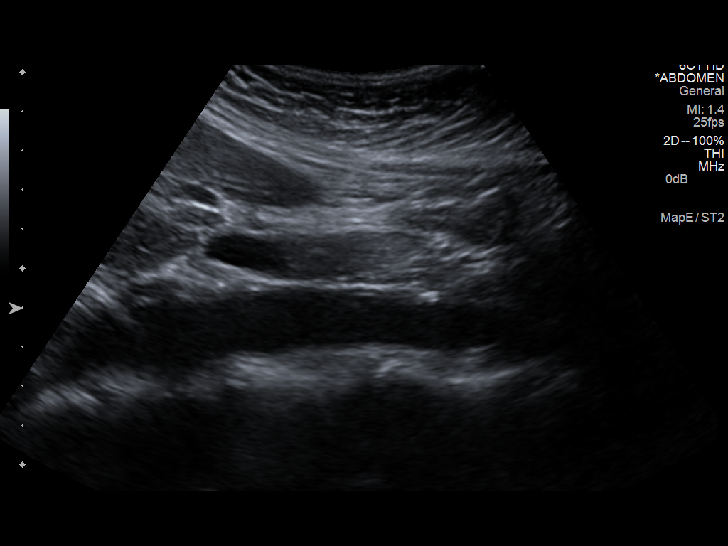
[im 14/80]
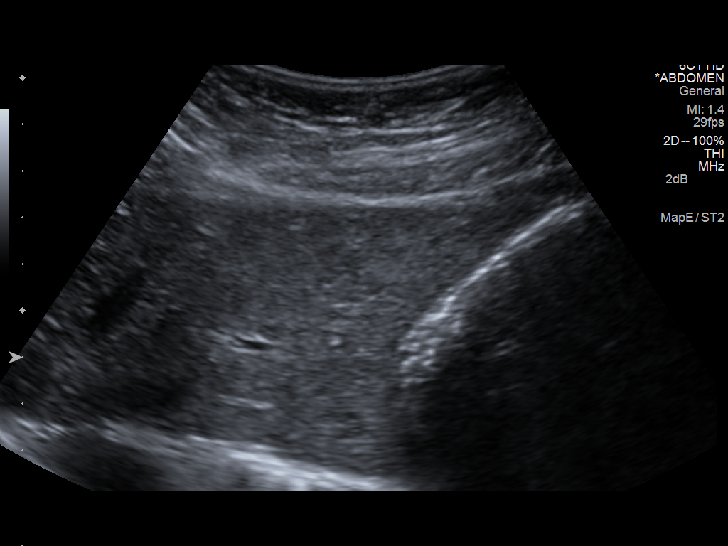
[im 20/80]
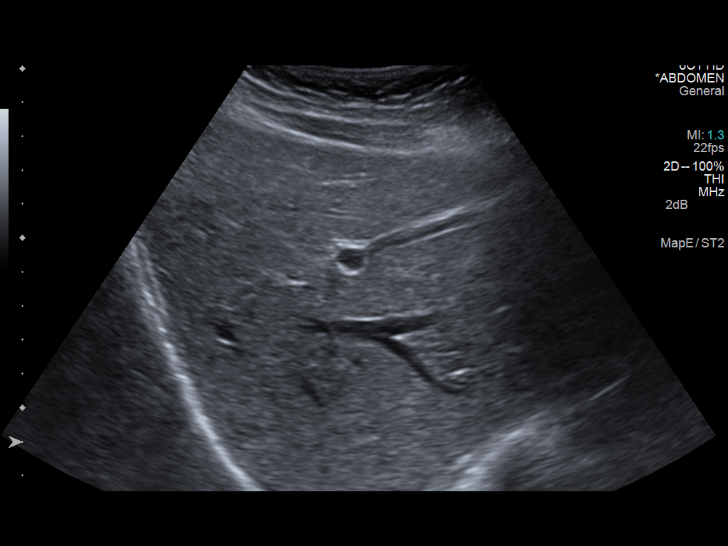
[im 27/80]
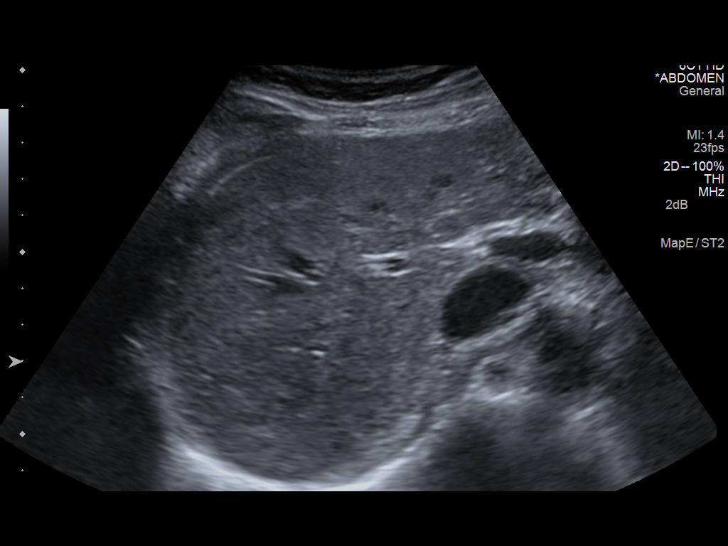
[im 33/80]
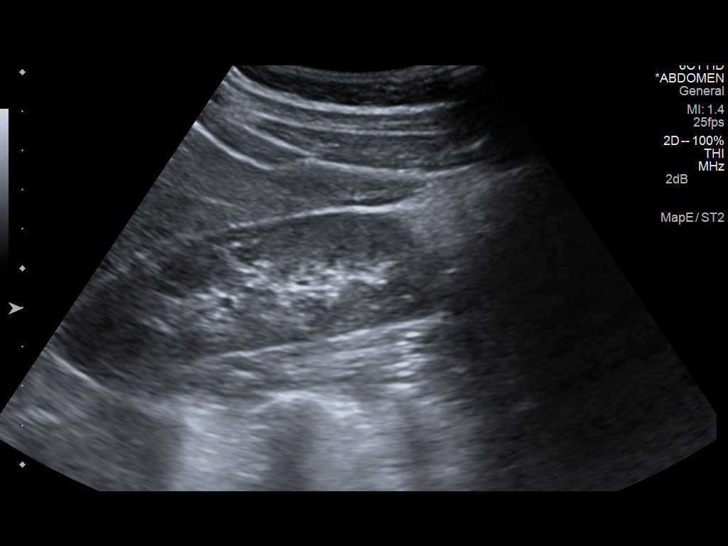
[im 40/80]
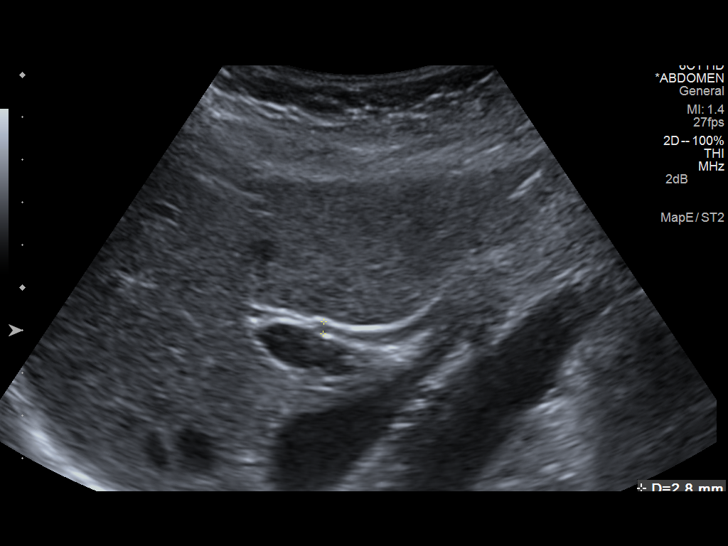
[im 47/80]
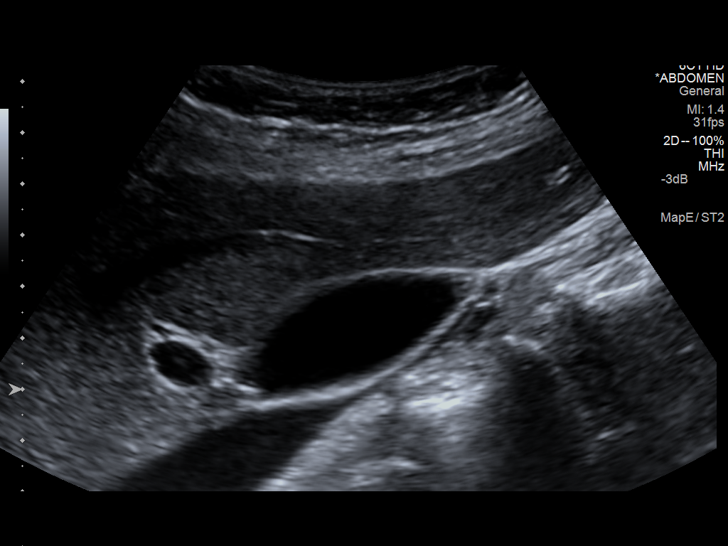
[im 53/80]
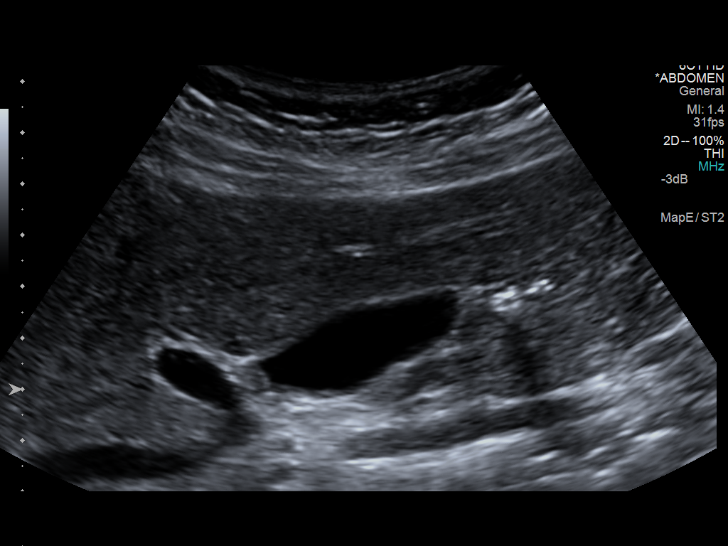
[im 60/80]
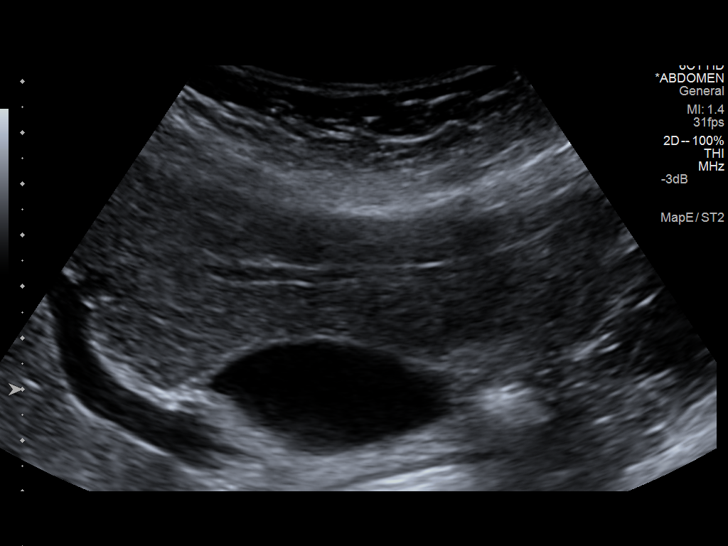
[im 66/80]
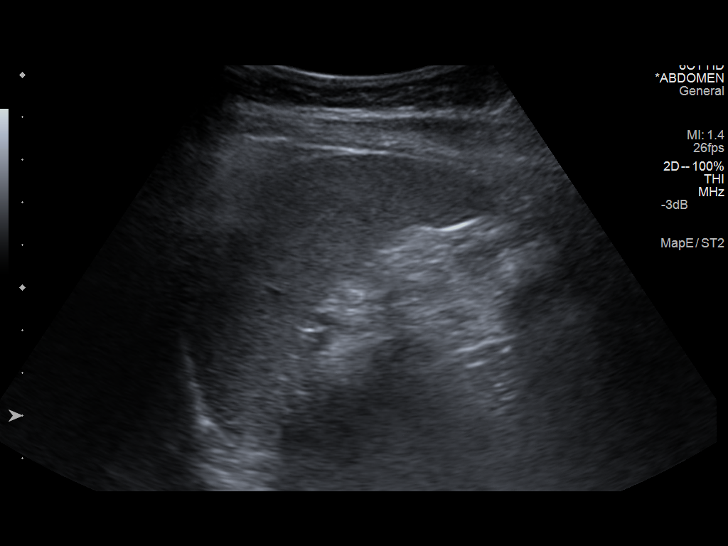
[im 73/80]
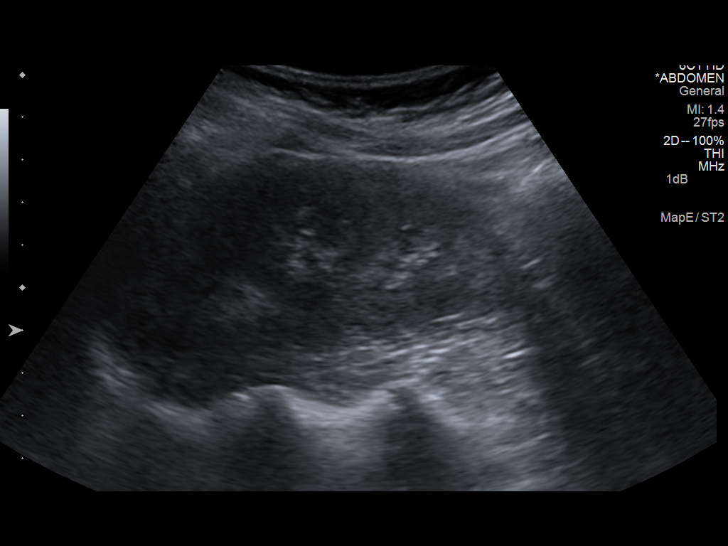
[im 80/80]
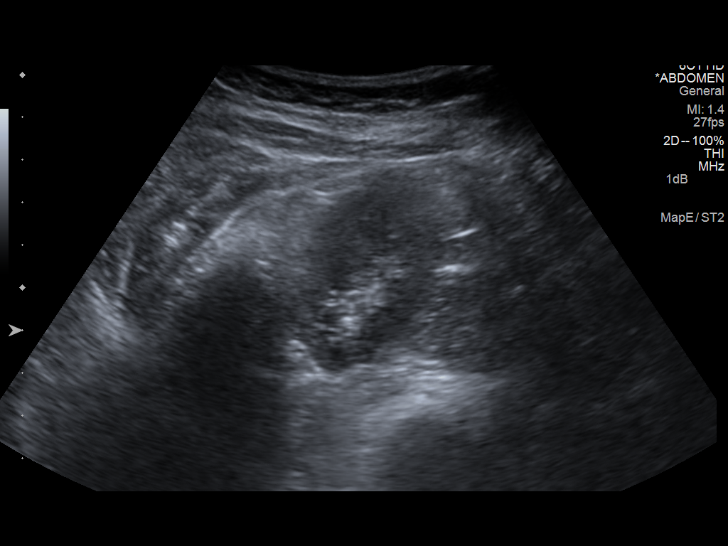

[13 of 25 positions shown; findings below may reference images not displayed]

FINDINGS: Gallbladder: No shadowing gallstones or echogenic sludge. No
gallbladder wall thickening or pericholecystic fluid. Negative
sonographic Murphy sign according to the ultrasound technologist.

Common bile duct: Diameter: Approximately 3 mm.

Liver: Normal size and echotexture without focal parenchymal
abnormality. Patent portal vein with hepatopetal flow.

IVC: Patent.

Pancreas: Normal size and echotexture without focal parenchymal
abnormality.

Spleen: Normal size and echotexture without focal parenchymal
abnormality.

Right Kidney: Length: Approximately 10.0 cm. No hydronephrosis.
Well-preserved cortex. No shadowing calculi. Normal parenchymal
echotexture. No focal parenchymal abnormality.

Left Kidney: Length: Approximately 10.0 cm. No hydronephrosis.
Well-preserved cortex. No shadowing calculi. Normal parenchymal
echotexture. No focal parenchymal abnormality.

Abdominal aorta: Normal in caliber throughout its visualized course
in the abdomen without evidence of significant atherosclerosis.
Maximum diameter 2.0 cm.

Other findings: None.
IMPRESSION: Normal examination.

## 2015-08-31 ENCOUNTER — Ambulatory Visit: Payer: Medicaid Other | Attending: Physician Assistant | Admitting: Physician Assistant

## 2015-08-31 VITALS — BP 116/77 | HR 79 | Temp 97.5°F | Wt 104.6 lb

## 2015-08-31 DIAGNOSIS — M545 Low back pain: Secondary | ICD-10-CM

## 2015-08-31 DIAGNOSIS — F17200 Nicotine dependence, unspecified, uncomplicated: Secondary | ICD-10-CM

## 2015-08-31 DIAGNOSIS — R358 Other polyuria: Secondary | ICD-10-CM

## 2015-08-31 DIAGNOSIS — R300 Dysuria: Secondary | ICD-10-CM

## 2015-08-31 DIAGNOSIS — Z720 Tobacco use: Secondary | ICD-10-CM

## 2015-08-31 DIAGNOSIS — R350 Frequency of micturition: Secondary | ICD-10-CM

## 2015-08-31 LAB — POCT URINALYSIS DIP (MANUAL ENTRY)
Bilirubin, UA: NEGATIVE
Glucose, UA: NEGATIVE
Ketones, POC UA: NEGATIVE
LEUKOCYTES UA: NEGATIVE
Nitrite, UA: NEGATIVE
PH UA: 7
PROTEIN UA: NEGATIVE
UROBILINOGEN UA: 0.2

## 2015-08-31 MED ORDER — FLUCONAZOLE 150 MG PO TABS: 150.0000 mg | ORAL_TABLET | Freq: Once | ORAL | Status: DC

## 2015-08-31 MED ORDER — NITROFURANTOIN MONOHYD MACRO 100 MG PO CAPS: 100.0000 mg | ORAL_CAPSULE | Freq: Two times a day (BID) | ORAL | Status: DC

## 2015-08-31 NOTE — Patient Instructions (Signed)
Asymptomatic Bacteriuria, Female Asymptomatic bacteriuria is the presence of a large number of bacteria in your urine without the usual symptoms of burning or frequent urination. The following conditions increase the risk of asymptomatic bacteriuria:  Diabetes mellitus.  Advanced age.  Pregnancy in the first trimester.  Kidney stones.  Kidney transplants.  Leaky kidney tube valve in young children (reflux). Treatment for this condition is not needed in most people and can lead to other problems such as too much yeast and growth of resistant bacteria. However, some people, such as pregnant women, do need treatment to prevent kidney infection. Asymptomatic bacteriuria in pregnancy is also associated with fetal growth restriction, premature labor, and newborn death. HOME CARE INSTRUCTIONS Monitor your condition for any changes. The following actions may help to relieve any discomfort you are feeling:  Drink enough water and fluids to keep your urine clear or pale yellow. Go to the bathroom more often to keep your bladder empty.  Keep the area around your vagina and rectum clean. Wipe yourself from front to back after urinating. SEEK IMMEDIATE MEDICAL CARE IF:  You develop signs of an infection such as:  Burning with urination.  Frequency of voiding.  Back pain.  Fever.  You have blood in the urine.  You develop a fever. MAKE SURE YOU:  Understand these instructions.  Will watch your condition.  Will get help right away if you are not doing well or get worse.   This information is not intended to replace advice given to you by your health care provider. Make sure you discuss any questions you have with your health care provider.   Document Released: 04/28/2005 Document Revised: 05/19/2014 Document Reviewed: 10/18/2012 Elsevier Interactive Patient Education 2016 Elsevier Inc.  

## 2015-08-31 NOTE — Progress Notes (Signed)
Patient ID: [name], female   DOB: March 05, 1966, 50 y.o.   MRN: [id number]   Cynthia Duran, is a 50 y.o. female  [id number]  [id number]  DOB - 1965/12/05  Chief Complaint  Patient presents with  . Dysuria    polyuria;LBP x 2 wks seen at an Urgent Care in [place] ; Pt stated she was to scared to take medication        Subjective:  Chief Complaint and HPI: Cynthia Duran is a 50 y.o. female here today for 2 week h/o dysuria, frequency, and bladder pressure.  She denies f/c.  She went to an urgent care in [place] 4 days ago and was started on 10days of Cipro but she only took 4 doses because she was worried about the possibility of tendon rupture.  Her symptoms have started to improve while on the medication.  She denies vaginal discharge and has no STD risk factors.  She has had intermittent R flank pain.  She admits to not drinking enough water.   She is also seeking help on smoking cessation. She has smoked about 1.5ppd since around age 8.  The longest she has successfully quit is 3 days.  She has selected Mother's Day as her quit date which will be her mom's birthday.  Her mom passed away 2 years ago and she knows that stopping smoking is something her mom wanted her to do.  She is looking into gym memberships and healthy replacements for smoking.   She still has a lot of grief surrounding her mom's death.  She denies SI/HI.  She is very involved with her kids.  Her youngest son is 36 and still lives at home.  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    ROS:   Constitutional:  No f/c, No night sweats, No unexplained weight loss. EENT:  No vision changes, No blurry vision, No hearing changes. No mouth, throat, or ear problems.  Respiratory: No cough, No SOB Cardiac: No CP, no palpitations GI:  No abd pain, No V/D; some nausea GU:  +urinary frequency and occasional dysuria with pelvic pressure.  Musculoskeletal: No joint pain Neuro: No headache, no dizziness, no  motor weakness.  Skin: No rash Endocrine:  No polydipsia. No polyuria.  Psych: Denies SI/HI  ALLERGIES: No Known Allergies  PAST MEDICAL HISTORY: Past Medical History  Diagnosis Date  . Frequent UTI   . Arthritis Dx 2006    MEDICATIONS AT HOME: Prior to Admission medications   Medication Sig Start Date End Date Taking? Authorizing Provider  aspirin (BAYER ASPIRIN EC LOW DOSE) 81 MG EC tablet Take 81 mg by mouth daily. Swallow whole.   Yes Historical Provider, MD  Biotin (PA BIOTIN) 1000 MCG tablet Take 1,000 mcg by mouth daily.   Yes Historical Provider, MD  ibuprofen (ADVIL,MOTRIN) 200 MG tablet Take 800 mg by mouth 3 (three) times daily. Reported on 05/24/2015   Yes Historical Provider, MD  Magnesium 250 MG TABS Take 1 tablet by mouth daily.   Yes Historical Provider, MD  calcium-vitamin D (OSCAL WITH D) 500-200 MG-UNIT per tablet Take 2 tablets by mouth 2 (two) times daily. Patient not taking: Reported on 05/24/2015 04/26/14   Boykin Nearing, MD  fluconazole (DIFLUCAN) 150 MG tablet Take 1 tablet (150 mg total) by mouth once. 08/31/15   Argentina Donovan, PA-C  nitrofurantoin, macrocrystal-monohydrate, (MACROBID) 100 MG capsule Take 1 capsule (100 mg total) by mouth 2 (two) times daily. 08/31/15   Dionne Bucy  Amantha Sklar, PA-C  PROAIR HFA 108 (90 Base) MCG/ACT inhaler Inhale 1 puff into the lungs. 07/30/15   Historical Provider, MD  Probiotic Product (TRUBIOTICS PO) Take 1 capsule by mouth daily. Reported on 08/31/2015    Historical Provider, MD  vitamin B-12 (CYANOCOBALAMIN) 1000 MCG tablet Take 1 tablet (1,000 mcg total) by mouth daily. Patient not taking: Reported on 08/31/2015 05/24/15   Boykin Nearing, MD     Objective:  EXAM:   Filed Vitals:   08/31/15 1041  BP: 116/77  Pulse: 79  Temp: 97.5 F (36.4 C)  TempSrc: Oral  Weight: 104 lb 9.6 oz (47.446 kg)    General appearance : A&OX3. NAD. Non-toxic-appearing HEENT: Atraumatic and Normocephalic.  PERRLA. EOM intact.  TM clear  B. Mouth-MMM, post pharynx WNL w/o erythema, No PND. Neck: supple, no JVD. No cervical lymphadenopathy. No thyromegaly Chest/Lungs:  Breathing-non-labored, Good air entry bilaterally, breath sounds normal without rales, rhonchi, or wheezing  CVS: S1 S2 regular, no murmurs, gallops, rubs  Abdomen: Bowel sounds present, Non tender and not distended with no gaurding, rigidity or rebound.  No CVA TTP.  Extremities: Bilateral Lower Ext shows no edema, both legs are warm to touch with = pulse throughout Neurology:  CN II-XII grossly intact, Non focal.   Psych:  TP linear. J/I WNL. Normal speech. Appropriate eye contact and affect.  Skin:  No Rash  Data Review Lab Results  Component Value Date   HGBA1C 5.40 05/24/2015     Assessment & Plan   1. Dysuria Urine is not terrible today, but she has had 4 doses of antibiotics with improving symptoms. - POCT urinalysis dipstick - Urine culture She is afraid to take Cipro due to risk of tendon rupture.  Stop Cipro.  Start Macrobid 100mg  bid X 10days.  I will still send her urine for culture even though she has had a few antibiotic doses. She is prone to yeast infections, so I have also prescribed Diflucan.   I encouraged her to drink ~80 ounces of water daily. We reviewed urinary hygiene.   2. Low back pain without sciatica, unspecified back pain laterality See #1 - POCT urinalysis dipstick - Urine culture  3. Frequency of urination and polyuria See #1 - POCT urinalysis dipstick - Urine culture  4. Smoker Smoking and dangers of nicotine have been discussed at length. Long term health consequences of smoking reviewed in detail.  Methods for helping with cessation have been reviewed and dispensed.  Patient expresses understanding.  Quit date is Mother's day in May 2017.  Additionally Grief support information through Hospice was given as she still has a lot of unprocessed emotions related to her mom's death 2 years ago.   I spent ~45 mins with  patient face to face.  More than 1/2 of the visit was counseling related to smoking cessation, urinary health, and grief support.  Patient have been counseled extensively about nutrition and exercise  Return in about 6 months (around 03/01/2016).;  Sooner if needed.   The patient was given clear instructions to go to ER or return to medical center if symptoms don't improve, worsen or new problems develop. The patient verbalized understanding. The patient was told to call to get lab results if they haven't heard anything in the next week.   Freeman Caldron, PA-C [facility] and [facility], [place]   08/31/2015, 12:13 PM

## 2015-09-01 LAB — URINE CULTURE
COLONY COUNT: NO GROWTH
ORGANISM ID, BACTERIA: NO GROWTH

## 2015-09-10 ENCOUNTER — Telehealth: Payer: Self-pay | Admitting: Family Medicine

## 2015-09-10 DIAGNOSIS — F17200 Nicotine dependence, unspecified, uncomplicated: Secondary | ICD-10-CM

## 2015-09-10 NOTE — Telephone Encounter (Signed)
Patient called requesting a referral to pulmonology, [facility] and was advised to get a pulmonary function test. Please f/up with patient if appt is needed.

## 2015-09-11 NOTE — Telephone Encounter (Signed)
Pulmonology referral placed Please inform patient

## 2015-09-13 ENCOUNTER — Other Ambulatory Visit (INDEPENDENT_AMBULATORY_CARE_PROVIDER_SITE_OTHER): Payer: Medicaid Other

## 2015-09-13 ENCOUNTER — Ambulatory Visit (INDEPENDENT_AMBULATORY_CARE_PROVIDER_SITE_OTHER): Payer: Medicaid Other | Admitting: Pulmonary Disease

## 2015-09-13 VITALS — BP 108/68 | HR 76 | Ht 60.0 in | Wt 103.0 lb

## 2015-09-13 DIAGNOSIS — J449 Chronic obstructive pulmonary disease, unspecified: Secondary | ICD-10-CM | POA: Diagnosis not present

## 2015-09-13 DIAGNOSIS — F17200 Nicotine dependence, unspecified, uncomplicated: Secondary | ICD-10-CM

## 2015-09-13 DIAGNOSIS — G4710 Hypersomnia, unspecified: Secondary | ICD-10-CM

## 2015-09-13 DIAGNOSIS — R0609 Other forms of dyspnea: Secondary | ICD-10-CM | POA: Diagnosis not present

## 2015-09-13 DIAGNOSIS — R5383 Other fatigue: Secondary | ICD-10-CM | POA: Insufficient documentation

## 2015-09-13 LAB — CBC WITH DIFFERENTIAL/PLATELET
BASOS ABS: 0 10*3/uL (ref 0.0–0.1)
Basophils Relative: 0.3 % (ref 0.0–3.0)
EOS ABS: 1.2 10*3/uL — AB (ref 0.0–0.7)
Eosinophils Relative: 13.1 % — ABNORMAL HIGH (ref 0.0–5.0)
HCT: 41 % (ref 36.0–46.0)
Hemoglobin: 14 g/dL (ref 12.0–15.0)
LYMPHS ABS: 4 10*3/uL (ref 0.7–4.0)
LYMPHS PCT: 46.1 % — AB (ref 12.0–46.0)
MCHC: 34.2 g/dL (ref 30.0–36.0)
MCV: 91.3 fl (ref 78.0–100.0)
Monocytes Absolute: 0.7 10*3/uL (ref 0.1–1.0)
Monocytes Relative: 8.4 % (ref 3.0–12.0)
NEUTROS ABS: 2.8 10*3/uL (ref 1.4–7.7)
NEUTROS PCT: 32.1 % — AB (ref 43.0–77.0)
PLATELETS: 268 10*3/uL (ref 150.0–400.0)
RBC: 4.49 Mil/uL (ref 3.87–5.11)
RDW: 13.6 % (ref 11.5–15.5)
WBC: 8.8 10*3/uL (ref 4.0–10.5)

## 2015-09-13 LAB — BASIC METABOLIC PANEL
BUN: 7 mg/dL (ref 6–23)
CALCIUM: 9.2 mg/dL (ref 8.4–10.5)
CO2: 29 meq/L (ref 19–32)
Chloride: 104 mEq/L (ref 96–112)
Creatinine, Ser: 0.57 mg/dL (ref 0.40–1.20)
GFR: 120.56 mL/min (ref >60.00)
GLUCOSE: 80 mg/dL (ref 70–99)
POTASSIUM: 4.1 meq/L (ref 3.5–5.1)
SODIUM: 140 meq/L (ref 135–145)

## 2015-09-13 LAB — THYROID PANEL WITH TSH
FREE THYROXINE INDEX: 2.1 (ref 1.4–3.8)
T3 UPTAKE: 27 % (ref 22–35)
T4, Total: 7.8 ug/dL (ref 4.5–12.0)
TSH: 1.51 m[IU]/L

## 2015-09-13 MED ORDER — BUDESONIDE-FORMOTEROL FUMARATE 160-4.5 MCG/ACT IN AERO: 2.0000 | INHALATION_SPRAY | Freq: Two times a day (BID) | RESPIRATORY_TRACT | Status: DC

## 2015-09-13 NOTE — Assessment & Plan Note (Addendum)
50PY smoking history. Pt cont to smoke cigarttes. CXR (2015) no infiltrtae. COPD changes.  Likely has mod-severe COPD.  Plan : 1. Smoking cessation 2. Start symbicort 160/4.5 2P BID.  3. Start albuterol 2P q4 prn 4. Needs pft, abg 5. May need spiriva 6. May need pulm rehab 7. Will need alpha one

## 2015-09-13 NOTE — Assessment & Plan Note (Addendum)
Pt continues to smoke cigarettes.  Plan to quit on Mother's Day. Wants to try patches.

## 2015-09-13 NOTE — Patient Instructions (Signed)
1. Start symbicort 160/4.5 2 puffs 2/day. Rinse your mouth with each use.  2. Start albuterol 2 puffes every 4 hrs as needed.  3. We will get breathing test, blood test.   Return to clinic in 3-4 mos.

## 2015-09-13 NOTE — Progress Notes (Signed)
Subjective:    Patient ID: Cynthia Duran, female    DOB: June 18, 1965, 50 y.o.   MRN: [id number]  HPI   This is the case of Cynthia Duran, 50 y.o. Female, who was referred by Dr. Wilburn Mylar in consultation regarding dyspnea.   As you very well know, patient has a 50PY smoking history, smokes 1.5 PPD.  Not been dxed with lung issues.   Pt has been SOB since Feb 2017. Was dxed with walking PNA. Took abx for 10days.  Had cough, fevers, SOB, wheezing.  Not back at baseline. Woke up this weekend SOB, wheezing, fevers, chills. Went to urgent care. CXR was Normal. Not given abx or prednisone.   She is SOB, coughing, mild wheezing. (-) fevers, chills.   Still smokes.   Very fatigued. Has snoring.  Denies witnessed apneas, gasping, chokinmg.  Sleeps x 5 hrs.  Wakes up unrefreshed.  (-) abn behavior in sleep.    Review of Systems  Constitutional: Negative.  Negative for fever and unexpected weight change.  HENT: Positive for ear pain. Negative for congestion, dental problem, nosebleeds, postnasal drip, rhinorrhea, sinus pressure, sneezing, sore throat and trouble swallowing.   Eyes: Negative.  Negative for redness and itching.  Respiratory: Positive for cough, shortness of breath and wheezing. Negative for chest tightness.   Cardiovascular: Negative.  Negative for palpitations and leg swelling.  Gastrointestinal: Negative.  Negative for nausea and vomiting.  Endocrine: Negative.   Genitourinary: Negative.  Negative for dysuria.  Musculoskeletal: Positive for joint swelling and arthralgias.  Skin: Negative.  Negative for rash.  Allergic/Immunologic: Negative.   Neurological: Positive for light-headedness. Negative for headaches.  Hematological: Negative.  Does not bruise/bleed easily.  Psychiatric/Behavioral: Negative.  Negative for dysphoric mood. The patient is not nervous/anxious.     Past Medical History  Diagnosis Date  . Frequent UTI   . Arthritis Dx 08-Sep-2004       Family History  Problem Relation Age of Onset  . Arthritis Mother     Rhematoid  . COPD Mother   . Heart disease Father   . Hypertension Father   . Diabetes Maternal Grandmother   . Thyroid disease Maternal Grandmother   . Cancer Maternal Aunt     breast      Past Surgical History  Procedure Laterality Date  . Cyst removed Right 2007-09-09     elbow, Dr. Silvio Pate     Social History   Social History  . Marital Status: Single    Spouse Name: N/A  . Number of Children: 3   . Years of Education: GED    Occupational History  . Unemployed     Social History Main Topics  . Smoking status: Current Every Day Smoker -- 1.50 packs/day for 30 years  . Smokeless tobacco: Never Used  . Alcohol Use: Yes     Comment: socially   . Drug Use: No  . Sexual Activity: Not Currently    Birth Control/ Protection: Post-menopausal   Other Topics Concern  . Not on file   Social History Narrative   3 children-   Age 29, 47, 27.       Has 31, 73 yo grandchild.    Mother passed away in 09-08-13.          Works as a Psychiatric nurse. Occasional ETOH.   Allergies  Allergen Reactions  . Prednisone Other (See Comments)    Pt reports "homicidal thoughts"  . Penicillins Rash     Outpatient Prescriptions  Prior to Visit  Medication Sig Dispense Refill  . PROAIR HFA 108 (90 Base) MCG/ACT inhaler Inhale 1 puff into the lungs. Reported on 09/13/2015  0  . aspirin (BAYER ASPIRIN EC LOW DOSE) 81 MG EC tablet Take 81 mg by mouth daily. Reported on 09/13/2015    . Biotin (PA BIOTIN) 1000 MCG tablet Take 1,000 mcg by mouth daily. Reported on 09/13/2015    . calcium-vitamin D (OSCAL WITH D) 500-200 MG-UNIT per tablet Take 2 tablets by mouth 2 (two) times daily. (Patient not taking: Reported on 05/24/2015) 120 tablet 11  . ibuprofen (ADVIL,MOTRIN) 200 MG tablet Take 800 mg by mouth 3 (three) times daily. Reported on 09/13/2015    . Magnesium 250 MG TABS Take 1 tablet by mouth daily. Reported on 09/13/2015    . Probiotic  Product (TRUBIOTICS PO) Take 1 capsule by mouth daily. Reported on 09/13/2015    . vitamin B-12 (CYANOCOBALAMIN) 1000 MCG tablet Take 1 tablet (1,000 mcg total) by mouth daily. (Patient not taking: Reported on 08/31/2015) 30 tablet 2  . fluconazole (DIFLUCAN) 150 MG tablet Take 1 tablet (150 mg total) by mouth once. 1 tablet 0  . nitrofurantoin, macrocrystal-monohydrate, (MACROBID) 100 MG capsule Take 1 capsule (100 mg total) by mouth 2 (two) times daily. 20 capsule 0   No facility-administered medications prior to visit.   Meds ordered this encounter  Medications  . budesonide-formoterol (SYMBICORT) 160-4.5 MCG/ACT inhaler    Sig: Inhale 2 puffs into the lungs 2 (two) times daily.    Dispense:  1 Inhaler    Refill:  6           Objective:   Physical Exam  Vitals:  Filed Vitals:   09/13/15 1508  BP: 108/68  Pulse: 76  Height: 5' (1.524 m)  Weight: 103 lb (46.72 kg)  SpO2: 94%    Constitutional/General:  Pleasant, well-nourished, well-developed, not in any distress,  Comfortably seating.  Well kempt  Body mass index is 20.12 kg/(m^2). Wt Readings from Last 3 Encounters:  09/13/15 103 lb (46.72 kg)  08/31/15 104 lb 9.6 oz (47.446 kg)  05/30/15 108 lb (48.988 kg)      HEENT: Pupils equal and reactive to light and accommodation. Anicteric sclerae. Normal nasal mucosa.   No oral  lesions,  mouth clear,  oropharynx clear, no postnasal drip. (-) Oral thrush. No dental caries.  Airway - Mallampati class III  Neck: No masses. Midline trachea. No JVD, (-) LAD. (-) bruits appreciated.  Respiratory/Chest: Grossly normal chest. (-) deformity. (-) Accessory muscle use.  Symmetric expansion. (-) Tenderness on palpation.  Resonant on percussion.  Diminished BS on both lower lung zones. (-)crackles, rhonchi. (+) occasional wheezing (-) egophony  Cardiovascular: Regular rate and  rhythm, heart sounds normal, no murmur or gallops, no peripheral edema  Gastrointestinal:  Normal  bowel sounds. Soft, non-tender. No hepatosplenomegaly.  (-) masses.   Musculoskeletal:  Normal muscle tone. Normal gait.   Extremities: Grossly normal. (-) clubbing, cyanosis.  (-) edema  Skin: (-) rash,lesions seen.   Neurological/Psychiatric : alert, oriented to time, place, person. Normal mood and affect             Assessment & Plan:  Smoker Pt continues to smoke cigarettes.  Plan to quit on Mother's Day. Wants to try patches.   COPD (chronic obstructive pulmonary disease) (HCC) 50PY smoking history. Pt cont to smoke cigarttes. CXR (2015) no infiltrtae. COPD changes.  Likely has mod-severe COPD.  Plan : 1. Smoking cessation  2. Start symbicort 160/4.5 2P BID.  3. Start albuterol 2P q4 prn 4. Needs pft, abg 5. May need spiriva 6. May need pulm rehab 7. Will need alpha one  Fatigue Fatigue x 1 yr, worsening. No recent labs per pt.  Family history of thyroid dse.  Need blood work.   Exertional dyspnea Likely 2/2 copd undetreated, deconditioning. May need to R/O CAD/CHF. Will need 2DEcho.   Hypersomnia Pt has hypersomnia, snoring, fatigue, sleepiness.  Hypersomnia affects fxnality. ESS 10.  Plan for a split night study.  Anticipate no issues with cpap.     Thank you very much for letting me participate in this patient's care. Please do not hesitate to give me a call if you have any questions or concerns regarding the treatment plan.   Patient will follow up with me in August.     Monica Becton, MD 09/14/2015   4:34 AM [facility] [place] Pager: [phone] Office: [phone], Fax: [phone]

## 2015-09-13 NOTE — Assessment & Plan Note (Addendum)
Fatigue x 1 yr, worsening. No recent labs per pt.  Family history of thyroid dse.  Need blood work.

## 2015-09-14 ENCOUNTER — Encounter: Payer: Self-pay | Admitting: Pulmonary Disease

## 2015-09-14 ENCOUNTER — Telehealth: Payer: Self-pay | Admitting: Pulmonary Disease

## 2015-09-14 DIAGNOSIS — R0609 Other forms of dyspnea: Secondary | ICD-10-CM

## 2015-09-14 DIAGNOSIS — G4710 Hypersomnia, unspecified: Secondary | ICD-10-CM | POA: Insufficient documentation

## 2015-09-14 DIAGNOSIS — R0600 Dyspnea, unspecified: Secondary | ICD-10-CM

## 2015-09-14 NOTE — Assessment & Plan Note (Signed)
Likely 2/2 copd undetreated, deconditioning. May need to R/O CAD/CHF. Will need 2DEcho.

## 2015-09-14 NOTE — Assessment & Plan Note (Signed)
Pt has hypersomnia, snoring, fatigue, sleepiness.  Hypersomnia affects fxnality. ESS 10.  Plan for a split night study.  Anticipate no issues with cpap.

## 2015-09-14 NOTE — Telephone Encounter (Signed)
Cynthia Duran -- Not sure if I told you but pt needs an inlab split night sleep study for hypersomnia. Place her on wait list if possible. Also, she needs echo for SOB.  Thanks.  AD

## 2015-09-17 ENCOUNTER — Ambulatory Visit (HOSPITAL_COMMUNITY)
Admission: RE | Admit: 2015-09-17 | Discharge: 2015-09-17 | Disposition: A | Discharge status: Home / Self Care | Payer: Medicaid Other | Source: Ambulatory Visit | Attending: Pulmonary Disease | Admitting: Pulmonary Disease

## 2015-09-17 ENCOUNTER — Inpatient Hospital Stay (HOSPITAL_COMMUNITY)
Admission: AD | Admit: 2015-09-17 | Discharge: 2015-09-17 | Disposition: A | Discharge status: Home / Self Care | Payer: Medicaid Other | Source: Ambulatory Visit | Attending: Family Medicine | Admitting: Family Medicine

## 2015-09-17 ENCOUNTER — Encounter (HOSPITAL_COMMUNITY): Payer: Self-pay | Admitting: *Deleted

## 2015-09-17 DIAGNOSIS — J189 Pneumonia, unspecified organism: Secondary | ICD-10-CM | POA: Insufficient documentation

## 2015-09-17 DIAGNOSIS — Z880 Allergy status to penicillin: Secondary | ICD-10-CM | POA: Insufficient documentation

## 2015-09-17 DIAGNOSIS — F17210 Nicotine dependence, cigarettes, uncomplicated: Secondary | ICD-10-CM | POA: Insufficient documentation

## 2015-09-17 DIAGNOSIS — J449 Chronic obstructive pulmonary disease, unspecified: Secondary | ICD-10-CM

## 2015-09-17 DIAGNOSIS — N950 Postmenopausal bleeding: Secondary | ICD-10-CM | POA: Diagnosis present

## 2015-09-17 DIAGNOSIS — N939 Abnormal uterine and vaginal bleeding, unspecified: Secondary | ICD-10-CM

## 2015-09-17 HISTORY — DX: Joint disorder, unspecified: M25.9

## 2015-09-17 HISTORY — DX: Candidiasis, unspecified: B37.9

## 2015-09-17 HISTORY — DX: Pneumonia, unspecified organism: J18.9

## 2015-09-17 HISTORY — DX: Urinary tract infection, site not specified: N39.0

## 2015-09-17 LAB — PULMONARY FUNCTION TEST
DL/VA % pred: 52 %
DL/VA: 2.22 ml/min/mmHg/L
DLCO COR % PRED: 55 %
DLCO COR: 10.45 ml/min/mmHg
DLCO UNC % PRED: 56 %
DLCO UNC: 10.7 ml/min/mmHg
FEF 25-75 POST: 0.57 L/s
FEF 25-75 PRE: 0.57 L/s
FEF2575-%Change-Post: 0 %
FEF2575-%PRED-PRE: 21 %
FEF2575-%Pred-Post: 21 %
FEV1-%Change-Post: 1 %
FEV1-%PRED-POST: 57 %
FEV1-%Pred-Pre: 56 %
FEV1-POST: 1.43 L
FEV1-Pre: 1.41 L
FEV1FVC-%Change-Post: 0 %
FEV1FVC-%PRED-PRE: 67 %
FEV6-%Change-Post: -3 %
FEV6-%PRED-POST: 80 %
FEV6-%Pred-Pre: 82 %
FEV6-Post: 2.44 L
FEV6-Pre: 2.52 L
FEV6FVC-%CHANGE-POST: -5 %
FEV6FVC-%PRED-POST: 94 %
FEV6FVC-%Pred-Pre: 99 %
FVC-%CHANGE-POST: 1 %
FVC-%Pred-Post: 85 %
FVC-%Pred-Pre: 83 %
FVC-Post: 2.65 L
FVC-Pre: 2.6 L
POST FEV1/FVC RATIO: 54 %
PRE FEV1/FVC RATIO: 54 %
Post FEV6/FVC ratio: 92 %
Pre FEV6/FVC Ratio: 97 %
RV % pred: 148 %
RV: 2.29 L
TLC % PRED: 112 %
TLC: 5.01 L

## 2015-09-17 LAB — URINALYSIS, ROUTINE W REFLEX MICROSCOPIC
Bilirubin Urine: NEGATIVE
GLUCOSE, UA: NEGATIVE mg/dL
KETONES UR: NEGATIVE mg/dL
LEUKOCYTES UA: NEGATIVE
NITRITE: NEGATIVE
PH: 5.5 (ref 5.0–8.0)
Protein, ur: NEGATIVE mg/dL

## 2015-09-17 LAB — WET PREP, GENITAL
CLUE CELLS WET PREP: NONE SEEN
Sperm: NONE SEEN
TRICH WET PREP: NONE SEEN
YEAST WET PREP: NONE SEEN

## 2015-09-17 LAB — BLOOD GAS, ARTERIAL
Acid-Base Excess: 0.3 mmol/L (ref 0.0–2.0)
BICARBONATE: 24.1 meq/L — AB (ref 20.0–24.0)
Drawn by: 244901
FIO2: 0.21
O2 SAT: 97.2 %
PATIENT TEMPERATURE: 98.6
PO2 ART: 90.6 mmHg (ref 80.0–100.0)
TCO2: 21.3 mmol/L (ref 0–100)
pCO2 arterial: 37.9 mmHg (ref 35.0–45.0)
pH, Arterial: 7.42 (ref 7.350–7.450)

## 2015-09-17 LAB — CBC
HEMATOCRIT: 40.9 % (ref 36.0–46.0)
HEMOGLOBIN: 14.2 g/dL (ref 12.0–15.0)
MCH: 31.6 pg (ref 26.0–34.0)
MCHC: 34.7 g/dL (ref 30.0–36.0)
MCV: 90.9 fL (ref 78.0–100.0)
Platelets: 246 10*3/uL (ref 150–400)
RBC: 4.5 MIL/uL (ref 3.87–5.11)
RDW: 13.5 % (ref 11.5–15.5)
WBC: 7.4 10*3/uL (ref 4.0–10.5)

## 2015-09-17 LAB — URINE MICROSCOPIC-ADD ON: WBC UA: NONE SEEN WBC/hpf (ref 0–5)

## 2015-09-17 MED ORDER — KETOROLAC TROMETHAMINE 10 MG PO TABS: 10.0000 mg | ORAL_TABLET | Freq: Three times a day (TID) | ORAL | Status: DC | PRN

## 2015-09-17 MED ORDER — KETOROLAC TROMETHAMINE 60 MG/2ML IM SOLN
60.0000 mg | Freq: Once | INTRAMUSCULAR | Status: AC
  Administered 2015-09-17: 60 mg via INTRAMUSCULAR
  Filled 2015-09-17: qty 2

## 2015-09-17 MED ORDER — ALBUTEROL SULFATE (2.5 MG/3ML) 0.083% IN NEBU
2.5000 mg | INHALATION_SOLUTION | Freq: Once | RESPIRATORY_TRACT | Status: AC
  Administered 2015-09-17: 2.5 mg via RESPIRATORY_TRACT

## 2015-09-17 NOTE — MAU Provider Note (Signed)
History     CSN: [id number]  Arrival date and time: 09/17/15 1006   None     Chief Complaint  Patient presents with  . Vaginal Bleeding  . Abdominal Pain   HPI   Cynthia Duran is a 50 y.o. female [phone] non-pregnant female presenting to MAU with post-menopausal bleeding. In May 2009 she had an IUD placed and then had it removed Feb 2014. She has not had a menstrual cycle since 2009 and blood work in 2014 confirmed that she was post menopausal.   She is here today with vaginal bleeding that started Saturday morning; it started out as brown and progressed to bright red vaginal bleeding. The bleeding now is bright red and light in flow.  Patient had a pap smear last year and it was "normal".   She does not have a GYN currently.   Patient is not sexually active.    OB History    Gravida Para Term Preterm AB TAB SAB Ectopic Multiple Living   4 3 3  1  1   3       Past Medical History  Diagnosis Date  . Frequent UTI   . Arthritis Dx 2006  . Knee joint disorder   . Pneumonia   . Yeast infection   . UTI (lower urinary tract infection)     Past Surgical History  Procedure Laterality Date  . Cyst removed Right 2009     elbow, Dr. Silvio Pate   . Laparoscopic abdominal exploration      Family History  Problem Relation Age of Onset  . Arthritis Mother     Rhematoid  . COPD Mother   . Heart disease Father   . Hypertension Father   . Diabetes Maternal Grandmother   . Thyroid disease Maternal Grandmother   . Cancer Maternal Aunt     breast     Social History  Substance Use Topics  . Smoking status: Current Every Day Smoker -- 1.50 packs/day for 30 years  . Smokeless tobacco: Never Used  . Alcohol Use: Yes     Comment: socially     Allergies:  Allergies  Allergen Reactions  . Prednisone Other (See Comments)    Pt reports "homicidal thoughts"  . Penicillins Rash    Has patient had a PCN reaction causing immediate rash, facial/tongue/throat swelling, SOB  or lightheadedness with hypotension: Yes Has patient had a PCN reaction causing severe rash involving mucus membranes or skin necrosis: No Has patient had a PCN reaction that required hospitalization No Has patient had a PCN reaction occurring within the last 10 years: No If all of the above answers are "NO", then may proceed with Cephalosporin use.     Prescriptions prior to admission  Medication Sig Dispense Refill Last Dose  . budesonide-formoterol (SYMBICORT) 160-4.5 MCG/ACT inhaler Inhale 2 puffs into the lungs 2 (two) times daily. 1 Inhaler 6 09/17/2015 at Unknown time  . calcium-vitamin D (OSCAL WITH D) 500-200 MG-UNIT per tablet Take 2 tablets by mouth 2 (two) times daily. (Patient not taking: Reported on 05/24/2015) 120 tablet 11 Not Taking  . PROAIR HFA 108 (90 Base) MCG/ACT inhaler Inhale 1 puff into the lungs. Reported on 09/13/2015  0 Taking  . vitamin B-12 (CYANOCOBALAMIN) 1000 MCG tablet Take 1 tablet (1,000 mcg total) by mouth daily. (Patient not taking: Reported on 08/31/2015) 30 tablet 2 Not Taking   Results for orders placed or performed during the hospital encounter of 09/17/15 (from the past 48 hour(s))  Urinalysis, Routine w reflex microscopic (not at [facility])     Status: Abnormal   Collection Time: 09/17/15 10:18 AM  Result Value Ref Range   Color, Urine YELLOW YELLOW   APPearance HAZY (A) CLEAR   Specific Gravity, Urine <1.005 (L) 1.005 - 1.030   pH 5.5 5.0 - 8.0   Glucose, UA NEGATIVE NEGATIVE mg/dL   Hgb urine dipstick LARGE (A) NEGATIVE   Bilirubin Urine NEGATIVE NEGATIVE   Ketones, ur NEGATIVE NEGATIVE mg/dL   Protein, ur NEGATIVE NEGATIVE mg/dL   Nitrite NEGATIVE NEGATIVE   Leukocytes, UA NEGATIVE NEGATIVE  Urine microscopic-add on     Status: Abnormal   Collection Time: 09/17/15 10:18 AM  Result Value Ref Range   Squamous Epithelial / LPF 0-5 (A) NONE SEEN   WBC, UA NONE SEEN 0 - 5 WBC/hpf   RBC / HPF 6-30 0 - 5 RBC/hpf   Bacteria, UA RARE (A) NONE SEEN  Wet  prep, genital     Status: Abnormal   Collection Time: 09/17/15 11:20 AM  Result Value Ref Range   Yeast Wet Prep HPF POC NONE SEEN NONE SEEN   Trich, Wet Prep NONE SEEN NONE SEEN   Clue Cells Wet Prep HPF POC NONE SEEN NONE SEEN   WBC, Wet Prep HPF POC FEW (A) NONE SEEN    Comment: MODERATE BACTERIA SEEN   Sperm NONE SEEN   CBC     Status: None   Collection Time: 09/17/15 11:34 AM  Result Value Ref Range   WBC 7.4 4.0 - 10.5 K/uL   RBC 4.50 3.87 - 5.11 MIL/uL   Hemoglobin 14.2 12.0 - 15.0 g/dL   HCT 40.9 36.0 - 46.0 %   MCV 90.9 78.0 - 100.0 fL   MCH 31.6 26.0 - 34.0 pg   MCHC 34.7 30.0 - 36.0 g/dL   RDW 13.5 11.5 - 15.5 %   Platelets 246 150 - 400 K/uL    Review of Systems  Constitutional: Negative for fever, chills and malaise/fatigue.  Gastrointestinal: Positive for abdominal pain (+ abdominal cramping ).  Genitourinary: Negative for dysuria.  Neurological: Negative for dizziness.   Physical Exam   Blood pressure 136/76, pulse 73, temperature 98 F (36.7 C), temperature source Oral, resp. rate 16, weight 105 lb 3.2 oz (47.718 kg), last menstrual period 09/27/2007.  Physical Exam  Constitutional: She is oriented to person, place, and time. She appears well-developed and well-nourished. No distress.  HENT:  Head: Normocephalic.  Eyes: Pupils are equal, round, and reactive to light.  GI: Soft. She exhibits no distension. There is no tenderness. There is no rebound.  Genitourinary:  Speculum exam: Vagina - Small amount of dark red blood noted in the vaginal canal.  Cervix - Active bleeding from the os; small amount  Bimanual exam: Cervix closed, tenderness at insertion.  Uterus non tender, normal size Adnexa non tender, no masses bilaterally GC/Chlam, wet prep done Chaperone present for exam.  Musculoskeletal: Normal range of motion.  Neurological: She is alert and oriented to person, place, and time.  Skin: Skin is warm. She is not diaphoretic.  Psychiatric: Her  behavior is normal.    MAU Course  Procedures  None  MDM  CBC  Wet prep GC  Assessment and Plan   A:  1. Abnormal vaginal bleeding   2. Post-menopausal bleeding     P:  Discharge home in stable condition Patient is scheduled for an out patient [place] Monday May 15th @ 10:00 for pelvic US  Message sent to the [facility] for follow up and possible endometrial biopsy.  RX: Toradol PO Bleeding precautions Pelvic rest.  Return to MAU if symptoms worsen    Lezlie Lye, NP 09/17/2015 1:51 PM

## 2015-09-17 NOTE — MAU Note (Signed)
Started brownish and light on Friday night,  Has gotten redder and heavier.   Last period was in 2009.  Is having abd pain.  Mostly sees it when she pees

## 2015-09-17 NOTE — Discharge Instructions (Signed)
Abnormal Uterine Bleeding Abnormal uterine bleeding can affect women at various stages in life, including teenagers, women in their reproductive years, pregnant women, and women who have reached menopause. Several kinds of uterine bleeding are considered abnormal, including:  Bleeding or spotting between periods.   Bleeding after sexual intercourse.   Bleeding that is heavier or more than normal.   Periods that last longer than usual.  Bleeding after menopause.  Many cases of abnormal uterine bleeding are minor and simple to treat, while others are more serious. Any type of abnormal bleeding should be evaluated by your health care provider. Treatment will depend on the cause of the bleeding. HOME CARE INSTRUCTIONS Monitor your condition for any changes. The following actions may help to alleviate any discomfort you are experiencing:  Avoid the use of tampons and douches as directed by your health care provider.  Change your pads frequently. You should get regular pelvic exams and Pap tests. Keep all follow-up appointments for diagnostic tests as directed by your health care provider.  SEEK MEDICAL CARE IF:   Your bleeding lasts more than 1 week.   You feel dizzy at times.  SEEK IMMEDIATE MEDICAL CARE IF:   You pass out.   You are changing pads every 15 to 30 minutes.   You have abdominal pain.  You have a fever.   You become sweaty or weak.   You are passing large blood clots from the vagina.   You start to feel nauseous and vomit. MAKE SURE YOU:   Understand these instructions.  Will watch your condition.  Will get help right away if you are not doing well or get worse.   This information is not intended to replace advice given to you by your health care provider. Make sure you discuss any questions you have with your health care provider.   Document Released: 04/28/2005 Document Revised: 05/03/2013 Document Reviewed: 11/25/2012 Elsevier Interactive  Patient Education 2016 [place].  Endometrial Biopsy Endometrial biopsy is a procedure in which a tissue sample is taken from inside the uterus. The tissue sample is then looked at under a microscope to see if the tissue is normal or abnormal. The endometrium is the lining of the uterus. This procedure helps determine where you are in your menstrual cycle and how hormone levels are affecting the lining of the uterus. This procedure may also be used to evaluate uterine bleeding or to diagnose endometrial cancer, tuberculosis, polyps, or inflammatory conditions.  LET [facility] CARE PROVIDER KNOW ABOUT:  Any allergies you have.  All medicines you are taking, including vitamins, herbs, eye drops, creams, and over-the-counter medicines.  Previous problems you or members of your family have had with the use of anesthetics.  Any blood disorders you have.  Previous surgeries you have had.  Medical conditions you have.  Possibility of pregnancy. RISKS AND COMPLICATIONS Generally, this is a safe procedure. However, as with any procedure, complications can occur. Possible complications include:  Bleeding.  Pelvic infection.  Puncture of the uterine wall with the biopsy device (rare). BEFORE THE PROCEDURE   Keep a record of your menstrual cycles as directed by your health care provider. You may need to schedule your procedure for a specific time in your cycle.  You may want to bring a sanitary pad to wear home after the procedure.  Arrange for someone to drive you home after the procedure if you will be given a medicine to help you relax (sedative). PROCEDURE   You may  be given a sedative to relax you.  You will lie on an exam table with your feet and legs supported as in a pelvic exam.  Your health care provider will insert an instrument (speculum) into your vagina to see your cervix.  Your cervix will be cleansed with an antiseptic solution. A medicine (local anesthetic) will  be used to numb the cervix.  A forceps instrument (tenaculum) will be used to hold your cervix steady for the biopsy.  A thin, rodlike instrument (uterine sound) will be inserted through your cervix to determine the length of your uterus and the location where the biopsy sample will be removed.  A thin, flexible tube (catheter) will be inserted through your cervix and into the uterus. The catheter is used to collect the biopsy sample from your endometrial tissue.  The catheter and speculum will then be removed, and the tissue sample will be sent to a lab for examination. AFTER THE PROCEDURE  You will rest in a recovery area until you are ready to go home.  You may have mild cramping and a small amount of vaginal bleeding for a few days after the procedure. This is normal.  Make sure you find out how to get your test results.   This information is not intended to replace advice given to you by your health care provider. Make sure you discuss any questions you have with your health care provider.   Document Released: 08/29/2004 Document Revised: 12/29/2012 Document Reviewed: 10/13/2012 Elsevier Interactive Patient Education [employer].

## 2015-09-17 NOTE — MAU Note (Signed)
Has not had a period since May 18th 2009; had an IUD up until 2014; c/o bright red bleeding that she notices when she voids and wipes; bleeding started 3 days ago; bleeding as gotten progressively heavier;

## 2015-09-17 NOTE — MAU Note (Signed)
Recent pneumonia and a UTI

## 2015-09-18 ENCOUNTER — Telehealth: Payer: Self-pay | Admitting: Pulmonary Disease

## 2015-09-18 LAB — GC/CHLAMYDIA PROBE AMP (~~LOC~~) NOT AT ARMC
Chlamydia: NEGATIVE
NEISSERIA GONORRHEA: NEGATIVE

## 2015-09-18 NOTE — Telephone Encounter (Signed)
Pt has a lot of stress.  i told her we need to check her heart and make sure she does not have heart dse making her sob.  Also, we need to make sure she does not have sleep apnea as she complains of FATIGUE.  I can explain results when I see her on f/u.  Thanks!  If she wants to hold off on tests, I am OK with it.   AD

## 2015-09-18 NOTE — Telephone Encounter (Addendum)
Patient calling because she had been set up for an Echo and Sleep study and was not aware that she needed the echo and sleep study.   Orders had been placed, but patient was not advised that orders were put in.  Advised patient reasons why she needed these tests based upon Dr. Corrie Dandy last OV notes.  Exertional dyspnea - Rush Landmark, MD at 09/14/2015 4:32 AM     Status: Written Related Problem: Exertional dyspnea   Expand All Collapse All   Likely 2/2 copd undetreated, deconditioning. May need to R/O CAD/CHF. Will need 2DEcho.             Hypersomnia - Rush Landmark, MD at 09/14/2015 4:33 AM     Status: Written Related Problem: Hypersomnia   Expand All Collapse All   Pt has hypersomnia, snoring, fatigue, sleepiness.  Hypersomnia affects fxnality. ESS 10.  Plan for a split night study.  Anticipate no issues with cpap.        Patient asked about her PFT results.  Advised her of results, but she is requesting to speak to Dr. Corrie Dandy about it in more detail. Dr. Corrie Dandy:  Pt can be reached at [phone].

## 2015-09-19 ENCOUNTER — Telehealth: Payer: Self-pay | Admitting: *Deleted

## 2015-09-19 NOTE — Telephone Encounter (Signed)
Spoke with pt and explained AD's request for additional testing. Also reviewed PFT/ABG results. Pt voiced understanding and is ok to proceed with testing. Nothing further needed

## 2015-09-19 NOTE — Telephone Encounter (Signed)
Initiated PA for Symbicort thru [facility]. [id number] Approved until 09/13/2016.  Pharmacy informed.

## 2015-09-21 ENCOUNTER — Ambulatory Visit (HOSPITAL_COMMUNITY): Payer: Medicaid Other

## 2015-09-24 ENCOUNTER — Ambulatory Visit (HOSPITAL_COMMUNITY)
Admission: RE | Admit: 2015-09-24 | Discharge: 2015-09-24 | Disposition: A | Discharge status: Home / Self Care | Payer: Medicaid Other | Source: Ambulatory Visit | Attending: Obstetrics and Gynecology | Admitting: Obstetrics and Gynecology

## 2015-09-24 ENCOUNTER — Telehealth: Payer: Self-pay | Admitting: *Deleted

## 2015-09-24 DIAGNOSIS — N83201 Unspecified ovarian cyst, right side: Secondary | ICD-10-CM | POA: Diagnosis not present

## 2015-09-24 DIAGNOSIS — N950 Postmenopausal bleeding: Secondary | ICD-10-CM | POA: Diagnosis not present

## 2015-09-24 DIAGNOSIS — N939 Abnormal uterine and vaginal bleeding, unspecified: Secondary | ICD-10-CM

## 2015-09-24 NOTE — Telephone Encounter (Signed)
Received a voicemail from [employer] need preauth for pelvic US That patient is having today-per chart review done 10:00 today.  Preauth completed and called Melonie with #,

## 2015-10-01 ENCOUNTER — Ambulatory Visit (HOSPITAL_COMMUNITY): Payer: Medicaid Other | Attending: Cardiology

## 2015-10-01 ENCOUNTER — Other Ambulatory Visit: Payer: Self-pay

## 2015-10-01 ENCOUNTER — Ambulatory Visit (INDEPENDENT_AMBULATORY_CARE_PROVIDER_SITE_OTHER): Payer: Medicaid Other | Admitting: Obstetrics and Gynecology

## 2015-10-01 ENCOUNTER — Encounter: Payer: Self-pay | Admitting: Obstetrics and Gynecology

## 2015-10-01 VITALS — BP 106/68 | HR 78 | Temp 97.7°F | Resp 18 | Ht 60.0 in | Wt 101.8 lb

## 2015-10-01 DIAGNOSIS — N83299 Other ovarian cyst, unspecified side: Secondary | ICD-10-CM

## 2015-10-01 DIAGNOSIS — N950 Postmenopausal bleeding: Secondary | ICD-10-CM | POA: Diagnosis not present

## 2015-10-01 DIAGNOSIS — R0600 Dyspnea, unspecified: Secondary | ICD-10-CM | POA: Diagnosis not present

## 2015-10-01 LAB — ECHOCARDIOGRAM COMPLETE
HEIGHTINCHES: 60 in
Weight: 1628.8 oz

## 2015-10-01 NOTE — Progress Notes (Signed)
CLINIC ENCOUNTER NOTE  History:  50 y.o. [id number] here today for f/u postmenopausal bleeding.  Last period was may 2009. Had iud at that time, removed 06/2012 and no bleeding since. Earlier this month had brown discharge, then red like a period, but spotting. Lasted for a week. None currently. N  Past Medical History  Diagnosis Date  . Frequent UTI   . Arthritis Dx 2006  . Knee joint disorder   . Pneumonia   . Yeast infection   . UTI (lower urinary tract infection)     Past Surgical History  Procedure Laterality Date  . Cyst removed Right 2009     elbow, Dr. Silvio Pate   . Laparoscopic abdominal exploration      The following portions of the patient's history were reviewed and updated as appropriate: allergies, current medications, past family history, past medical history, past social history, past surgical history and problem list. Some more discharge than normal. U/s one week ago.    Review of Systems:  See above; comprehensive review of systems was otherwise negative.  Objective:  Physical Exam BP 106/68 mmHg  Pulse 78  Temp(Src) 97.7 F (36.5 C) (Oral)  Resp 18  Ht 5' (1.524 m)  Wt 101 lb 12.8 oz (46.176 kg)  BMI 19.88 kg/m2  SpO2 100%  LMP 09/27/2007 (Exact Date) CONSTITUTIONAL: Well-developed, well-nourished female in no acute distress.  HENT:  Normocephalic, atraumatic SKIN: Skin is warm and dry.  [place]: Alert  PSYCHIATRIC: Normal mood and affect.  CARDIOVASCULAR: Normal heart rate noted RESPIRATORY: Effort and breath sounds normal, no problems with respiration noted ABDOMEN: Soft, no distention noted.  No tenderness, rebound or guarding.   PELVIC: Deferred   Labs and Imaging US Transvaginal Non-ob  09/24/2015  CLINICAL DATA:  Patient with history of postmenopausal bleeding. EXAM: TRANSABDOMINAL AND TRANSVAGINAL ULTRASOUND OF PELVIS TECHNIQUE: Both transabdominal and transvaginal ultrasound examinations of the pelvis were performed. Transabdominal  technique was performed for global imaging of the pelvis including uterus, ovaries, adnexal regions, and pelvic cul-de-sac. It was necessary to proceed with endovaginal exam following the transabdominal exam to visualize the endometrium. COMPARISON:  Abdominal ultrasound 05/08/2014 FINDINGS: Uterus Measurements: 7.4 x 3.8 x 4.0 cm. No fibroids or other mass visualized. Endometrium Thickness: 4 mm.  No focal abnormality visualized. Right ovary Measurements: 2.0 x 1.3 x 2.1 cm. There is a 1.1 x 1.5 x 0.9 cm right ovarian cyst. Suggestion of an internal septation. There is a single focus of color within the septation on color Doppler imaging. This may represent artifact or possibly small amount of vascular flow. Left ovary Measurements: 1.3 x 1.4 x 0.9 cm. Normal appearance/no adnexal mass. Other findings No abnormal free fluid. IMPRESSION: Endometrium measures 4 mm. In the setting of post-menopausal bleeding, this is consistent with a benign etiology such as endometrial atrophy. If bleeding remains unresponsive to hormonal or medical therapy, sonohysterogram should be considered for focal lesion work-up. (Ref: Radiological Reasoning: Algorithmic Workup of Abnormal Vaginal Bleeding with Endovaginal Sonography and Sonohysterography. AJR [id number]) Within the right ovary there is a 1.5 cm cyst with an internal septation. Possible small amount of internal flow within the septation versus artifact. Given the overall sonographic findings, recommend initial evaluation with repeat ultrasound of the pelvis in 6- 8 weeks to assess for interval change/resolution. Electronically Signed   By: Lovey Newcomer M.D.   On: 09/24/2015 10:54   US Pelvis Complete  09/24/2015  CLINICAL DATA:  Patient with history of postmenopausal bleeding. EXAM: TRANSABDOMINAL AND TRANSVAGINAL  ULTRASOUND OF PELVIS TECHNIQUE: Both transabdominal and transvaginal ultrasound examinations of the pelvis were performed. Transabdominal technique was  performed for global imaging of the pelvis including uterus, ovaries, adnexal regions, and pelvic cul-de-sac. It was necessary to proceed with endovaginal exam following the transabdominal exam to visualize the endometrium. COMPARISON:  Abdominal ultrasound 05/08/2014 FINDINGS: Uterus Measurements: 7.4 x 3.8 x 4.0 cm. No fibroids or other mass visualized. Endometrium Thickness: 4 mm.  No focal abnormality visualized. Right ovary Measurements: 2.0 x 1.3 x 2.1 cm. There is a 1.1 x 1.5 x 0.9 cm right ovarian cyst. Suggestion of an internal septation. There is a single focus of color within the septation on color Doppler imaging. This may represent artifact or possibly small amount of vascular flow. Left ovary Measurements: 1.3 x 1.4 x 0.9 cm. Normal appearance/no adnexal mass. Other findings No abnormal free fluid. IMPRESSION: Endometrium measures 4 mm. In the setting of post-menopausal bleeding, this is consistent with a benign etiology such as endometrial atrophy. If bleeding remains unresponsive to hormonal or medical therapy, sonohysterogram should be considered for focal lesion work-up. (Ref: Radiological Reasoning: Algorithmic Workup of Abnormal Vaginal Bleeding with Endovaginal Sonography and Sonohysterography. AJR [id number]) Within the right ovary there is a 1.5 cm cyst with an internal septation. Possible small amount of internal flow within the septation versus artifact. Given the overall sonographic findings, recommend initial evaluation with repeat ultrasound of the pelvis in 6- 8 weeks to assess for interval change/resolution. Electronically Signed   By: Lovey Newcomer M.D.   On: 09/24/2015 10:54    Assessment & Plan:   # Postmenopausal bleeding - one instance, now resolved. Not anemic, normal tsh recently, normal g/c, normal urinalysis, normal pap 2015. US showing 4 mm stripe, low risk of carcinoma - check a1c - return for emb if bleeding returns - f/u wet prep  # Complex ovarian cyst - -  ordering repeat u/s in 8 weeks  Routine preventative health maintenance measures emphasized.     Noah B. Wouk, [place] for [employer], [place]

## 2015-10-01 NOTE — Patient Instructions (Signed)
Given that the ultrasound of your uterus is normal, it is unlikely that you have cancer. However, if you continue to bleed, you should f/u here again to perform a biopsy of your endometrium (the lining of your uterus). We will contact you with the results of the tests we performed today.

## 2015-10-02 ENCOUNTER — Telehealth: Payer: Self-pay | Admitting: *Deleted

## 2015-10-02 LAB — WET PREP, GENITAL
TRICH WET PREP: NONE SEEN
WBC WET PREP: NONE SEEN
Yeast Wet Prep HPF POC: NONE SEEN

## 2015-10-02 LAB — HEMOGLOBIN A1C
Hgb A1c MFr Bld: 5.5 % (ref <5.7)
Mean Plasma Glucose: 111 mg/dL

## 2015-10-02 MED ORDER — METRONIDAZOLE 500 MG PO TABS: 500.0000 mg | ORAL_TABLET | Freq: Two times a day (BID) | ORAL | Status: DC

## 2015-10-02 NOTE — Telephone Encounter (Signed)
Per Dr. 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 Also, her A1c is wnl. I called pt and left message stating that I was calling with test results. Please call back and state whether a detailed message can be left on her voice mail.

## 2015-10-02 NOTE — Addendum Note (Signed)
Addended by: Laurey Arrow B on: 10/02/2015 09:46 AM   Modules accepted: Orders

## 2015-10-03 NOTE — Telephone Encounter (Signed)
Spoke with patient concerning lab results and medicine that has been prescribe to the pharmacy.

## 2015-10-15 ENCOUNTER — Ambulatory Visit (HOSPITAL_BASED_OUTPATIENT_CLINIC_OR_DEPARTMENT_OTHER): Payer: Medicaid Other | Attending: Pulmonary Disease | Admitting: Pulmonary Disease

## 2015-10-15 VITALS — Ht 60.0 in | Wt 103.0 lb

## 2015-10-15 DIAGNOSIS — R5383 Other fatigue: Secondary | ICD-10-CM | POA: Insufficient documentation

## 2015-10-15 DIAGNOSIS — G4710 Hypersomnia, unspecified: Secondary | ICD-10-CM | POA: Diagnosis not present

## 2015-10-15 DIAGNOSIS — R0600 Dyspnea, unspecified: Secondary | ICD-10-CM | POA: Insufficient documentation

## 2015-10-15 DIAGNOSIS — R0683 Snoring: Secondary | ICD-10-CM | POA: Diagnosis not present

## 2015-10-15 DIAGNOSIS — Z79899 Other long term (current) drug therapy: Secondary | ICD-10-CM | POA: Insufficient documentation

## 2015-10-18 ENCOUNTER — Telehealth: Payer: Self-pay | Admitting: Pulmonary Disease

## 2015-10-18 ENCOUNTER — Telehealth: Payer: Self-pay | Admitting: General Practice

## 2015-10-18 DIAGNOSIS — G4710 Hypersomnia, unspecified: Secondary | ICD-10-CM | POA: Diagnosis not present

## 2015-10-18 NOTE — Procedures (Signed)
    NAME: Cynthia Duran DATE OF BIRTH:  [birth year] MEDICAL RECORD NUMBER [id number]  [facility]  PHYSICIAN: [place]  DATE OF STUDY: 10/15/2015   Patient Name: Cynthia Duran, Cynthia Duran Date: 10/15/2015   Gender: Female  D.O.B: 05-07-1966  Age (years): 49  Referring Provider: [place]   Height (inches): 60  Interpreting Physician: [place]   Weight (lbs): 103  RPSGT: Carolin Coy   BMI: 20  MRN: [id number]  Neck Size: 11.00     CLINICAL INFORMATION  Sleep Study Type: NPSG  Indication for sleep study: Fatigue  Epworth Sleepiness Score: 2   SLEEP STUDY TECHNIQUE  As per the AASM Manual for the Scoring of Sleep and Associated Events v2.3 (April 2016) with a hypopnea requiring 4% desaturations.  The channels recorded and monitored were frontal, central and occipital EEG, electrooculogram (EOG), submentalis EMG (chin), nasal and oral airflow, thoracic and abdominal wall motion, anterior tibialis EMG, snore microphone, electrocardiogram, and pulse oximetry.   MEDICATIONS  Patient's medications include: SYMBICORT. Medication chart review was done. Medications self-administered by patient during sleep study : No sleep medicine administered.  SLEEP ARCHITECTURE  The study was initiated at 10:28:32 PM and ended at 4:53:17 AM.  Sleep onset time was 58.8 minutes and the sleep efficiency was 74.5%. The total sleep time was 286.5 minutes.  Stage REM latency was 83.5 minutes.  The patient spent 9.95% of the night in stage N1 sleep, 81.68% in stage N2 sleep, 0.00% in stage N3 and 8.38% in REM.  Alpha intrusion was absent.  Supine sleep was 0.00%.   RESPIRATORY PARAMETERS  The overall apnea/hypopnea index (AHI) was 0.4 per hour. There were 0 total apneas, including 0 obstructive, 0 central and 0 mixed apneas. There were 2 hypopneas and 3 RERAs.  The AHI during Stage REM sleep was 0.0 per hour. AHI while supine was  N/A per hour.  The mean oxygen saturation was 92.00%. The minimum SpO2 during sleep was 88.00%.  Soft snoring was noted during this study.  CARDIAC DATA  The 2 lead EKG demonstrated sinus rhythm. The mean heart rate was 64.29 beats per minute. Other EKG findings include: None.   LEG MOVEMENT DATA  The total PLMS were 135 with a resulting PLMS index of 28.27. Associated arousal with leg movement index was 11.1 .  IMPRESSIONS  No significant obstructive sleep apnea occurred during this study (AHI = 0.4/h). No significant central sleep apnea occurred during this study (CAI = 0.0/h). The patient had minimal or no oxygen desaturation during the study (Min O2 = 88.00%) The patient snored with Soft snoring volume. No cardiac abnormalities were noted during this study. Moderate periodic limb movements of sleep occurred during the study. Associated arousals were significant.  DIAGNOSIS  Elevated PLMSI. Consider RLS.  RECOMMENDATIONS  No evidence for significant OSA based on this study. Clinical correlation regarding possible RLS. PLMSI was elevated at 28. Avoid alcohol, sedatives and other CNS depressants that may worsen sleep apnea and disrupt normal sleep architecture. Sleep hygiene should be reviewed to assess factors that may improve sleep quality. Weight management and regular exercise should be initiated or continued if appropriate.  Monica Becton, MD 10/18/2015, 2:42 PM [facility] Pager [phone] After 3 pm or if no answer, call [phone]

## 2015-10-18 NOTE — Telephone Encounter (Signed)
   Cynthia Duran ; pls tell pt the lab sleep study did NOT show OSA. It showed that she was moving her legs often and she may have restless legs syndrome.  I can discuss treatment for that on f/u if her legs are bothering her.  Thanks.   AD

## 2015-10-18 NOTE — Telephone Encounter (Signed)
Patient's ultrasound needed to be rescheduled- was scheduled too close to previous u/s. Called patient and informed her. Ultrasound was rescheduled for 7/10. Patient had no questions

## 2015-10-19 NOTE — Telephone Encounter (Signed)
Pt given results and recommendations. Nothing further needed.

## 2015-10-22 ENCOUNTER — Ambulatory Visit (HOSPITAL_COMMUNITY): Payer: Medicaid Other

## 2015-11-14 ENCOUNTER — Ambulatory Visit (HOSPITAL_COMMUNITY): Payer: Medicaid Other

## 2015-11-19 ENCOUNTER — Ambulatory Visit (HOSPITAL_COMMUNITY)
Admission: RE | Admit: 2015-11-19 | Discharge: 2015-11-19 | Disposition: A | Discharge status: Home / Self Care | Payer: Medicaid Other | Source: Ambulatory Visit | Attending: Obstetrics and Gynecology | Admitting: Obstetrics and Gynecology

## 2015-11-19 DIAGNOSIS — N950 Postmenopausal bleeding: Secondary | ICD-10-CM | POA: Insufficient documentation

## 2015-11-20 ENCOUNTER — Telehealth: Payer: Self-pay | Admitting: *Deleted

## 2015-11-20 NOTE — Telephone Encounter (Signed)
-----   Message from [facility], MD sent at 11/19/2015  3:54 PM EDT ----- Please notify patient of normal ultrasound - no f/u necessary. Thanks, Olen Cordial  ----- Message -----    From: Rad Results In Interface    Sent: 11/19/2015  10:32 AM      To: Gwynne Edinger, MD

## 2015-11-20 NOTE — Telephone Encounter (Signed)
Called pt and informed her of [place] result normal and no follow up is necessary.  Pt voiced understanding.

## 2015-12-24 ENCOUNTER — Ambulatory Visit: Payer: Medicaid Other | Admitting: Pulmonary Disease

## 2016-03-14 ENCOUNTER — Ambulatory Visit: Payer: Self-pay | Attending: Family Medicine | Admitting: Family Medicine

## 2016-03-14 ENCOUNTER — Encounter: Payer: Self-pay | Admitting: Family Medicine

## 2016-03-14 VITALS — BP 116/76 | HR 83 | Temp 98.1°F | Ht 60.0 in | Wt 100.6 lb

## 2016-03-14 DIAGNOSIS — J449 Chronic obstructive pulmonary disease, unspecified: Secondary | ICD-10-CM

## 2016-03-14 DIAGNOSIS — F17210 Nicotine dependence, cigarettes, uncomplicated: Secondary | ICD-10-CM | POA: Insufficient documentation

## 2016-03-14 DIAGNOSIS — B9689 Other specified bacterial agents as the cause of diseases classified elsewhere: Secondary | ICD-10-CM

## 2016-03-14 DIAGNOSIS — J069 Acute upper respiratory infection, unspecified: Secondary | ICD-10-CM | POA: Insufficient documentation

## 2016-03-14 DIAGNOSIS — N898 Other specified noninflammatory disorders of vagina: Secondary | ICD-10-CM

## 2016-03-14 DIAGNOSIS — N760 Acute vaginitis: Secondary | ICD-10-CM

## 2016-03-14 LAB — POCT URINALYSIS DIPSTICK
BILIRUBIN UA: NEGATIVE
Glucose, UA: NEGATIVE
KETONES UA: NEGATIVE
Leukocytes, UA: NEGATIVE
NITRITE UA: NEGATIVE
PH UA: 6.5
Protein, UA: NEGATIVE
RBC UA: NEGATIVE
Urobilinogen, UA: 0.2

## 2016-03-14 MED ORDER — DOXYCYCLINE HYCLATE 100 MG PO TABS: 100.0000 mg | ORAL_TABLET | Freq: Two times a day (BID) | ORAL | 0 refills | Status: DC

## 2016-03-14 MED ORDER — BUDESONIDE-FORMOTEROL FUMARATE 160-4.5 MCG/ACT IN AERO: 2.0000 | INHALATION_SPRAY | Freq: Two times a day (BID) | RESPIRATORY_TRACT | 6 refills | Status: DC

## 2016-03-14 NOTE — Progress Notes (Signed)
Subjective:  Patient ID: Cynthia Duran, female    DOB: 1966-01-12  Age: 50 y.o. MRN: [id number]  CC: URI   HPI KELTIE SOLOMONSON presents for    1.  Cold symptoms: congestion, green mucus, productive cough. Present for past 2 months.   2. Vaginal discharge: vaginal discharge. No odor. This has been ongoing for a few months. She was treated you flagyl on 02/25/16. She is not sexually active. She had sex on 12/31/2015. She used a condom. She is having pelvic cramping.   Social History  Substance Use Topics  . Smoking status: Current Every Day Smoker    Packs/day: 1.50    Years: 30.00  . Smokeless tobacco: Never Used  . Alcohol use Yes     Comment: socially     Outpatient Medications Prior to Visit  Medication Sig Dispense Refill  . budesonide-formoterol (SYMBICORT) 160-4.5 MCG/ACT inhaler Inhale 2 puffs into the lungs 2 (two) times daily. 1 Inhaler 6  . ketorolac (TORADOL) 10 MG tablet Take 1 tablet (10 mg total) by mouth every 8 (eight) hours as needed. (Patient not taking: Reported on 03/14/2016) 10 tablet 0  . metroNIDAZOLE (FLAGYL) 500 MG tablet Take 1 tablet (500 mg total) by mouth 2 (two) times daily. (Patient not taking: Reported on 03/14/2016) 14 tablet 0  . PROAIR HFA 108 (90 Base) MCG/ACT inhaler Inhale 1 puff into the lungs. Reported on 10/01/2015  0   No facility-administered medications prior to visit.     ROS Review of Systems  Constitutional: Negative for chills and fever.  HENT: Positive for congestion.   Eyes: Negative for visual disturbance.  Respiratory: Positive for cough. Negative for shortness of breath.   Cardiovascular: Negative for chest pain.  Gastrointestinal: Negative for abdominal pain and blood in stool.  Genitourinary: Positive for vaginal discharge.  Musculoskeletal: Negative for arthralgias and back pain.  Skin: Negative for rash.  Allergic/Immunologic: Negative for immunocompromised state.  Hematological: Negative for adenopathy. Does not  bruise/bleed easily.  Psychiatric/Behavioral: Negative for dysphoric mood and suicidal ideas.    Objective:  BP 116/76 (BP Location: Left Arm, Patient Position: Sitting, Cuff Size: Small)   Pulse 83   Temp 98.1 F (36.7 C) (Oral)   Ht 5' (1.524 m)   Wt 100 lb 9.6 oz (45.6 kg)   LMP 09/27/2007 (Exact Date)   BMI 19.65 kg/m   BP/Weight 03/14/2016 10/15/2015 99991111  Systolic BP 99991111 - [id number]  Diastolic BP 76 - 68  Wt. (Lbs) 100.6 103 101.8  BMI 19.65 20.12 19.88    Physical Exam  Constitutional: She is oriented to person, place, and time. She appears well-developed and well-nourished. No distress.  HENT:  Head: Normocephalic and atraumatic.  Right Ear: Tympanic membrane and external ear normal.  Left Ear: Tympanic membrane, external ear and ear canal normal.  Nose: Nose normal.  Mouth/Throat: Oropharynx is clear and moist.  Cardiovascular: Normal rate, regular rhythm, normal heart sounds and intact distal pulses.   Pulmonary/Chest: Effort normal and breath sounds normal.  Genitourinary: Uterus normal. Pelvic exam was performed with patient prone. There is no rash, tenderness or lesion on the right labia. There is no rash, tenderness or lesion on the left labia. Cervix exhibits no motion tenderness, no discharge and no friability. Vaginal discharge (thin white vaginal discharge ) found.  Musculoskeletal: She exhibits no edema.  Lymphadenopathy:       Right: No inguinal adenopathy present.       Left: No inguinal adenopathy present.  Neurological: She is alert and oriented to person, place, and time.  Skin: Skin is warm and dry. No rash noted.  Psychiatric: She has a normal mood and affect.     Assessment & Plan:  Honesty was seen today for uri.  Diagnoses and all orders for this visit:  Vaginal discharge -     Cervicovaginal ancillary only -     POCT urinalysis dipstick -     Urine cytology ancillary only  Upper respiratory tract infection, unspecified type -      doxycycline (VIBRA-TABS) 100 MG tablet; Take 1 tablet (100 mg total) by mouth 2 (two) times daily.  Chronic obstructive pulmonary disease, unspecified COPD type (HCC) -     budesonide-formoterol (SYMBICORT) 160-4.5 MCG/ACT inhaler; Inhale 2 puffs into the lungs 2 (two) times daily.   There are no diagnoses linked to this encounter.  No orders of the defined types were placed in this encounter.   Follow-up: Return in about 3 weeks (around 04/04/2016) for URI .   Boykin Nearing MD

## 2016-03-14 NOTE — Assessment & Plan Note (Signed)
A: URI x 2 months P: Doxycycline course

## 2016-03-14 NOTE — Patient Instructions (Addendum)
Cynthia Duran was seen today for uri.  Diagnoses and all orders for this visit:  Vaginal discharge -     Cervicovaginal ancillary only -     POCT urinalysis dipstick -     Urine cytology ancillary only  Upper respiratory tract infection, unspecified type -     doxycycline (VIBRA-TABS) 100 MG tablet; Take 1 tablet (100 mg total) by mouth 2 (two) times daily.  Chronic obstructive pulmonary disease, unspecified COPD type (HCC) -     budesonide-formoterol (SYMBICORT) 160-4.5 MCG/ACT inhaler; Inhale 2 puffs into the lungs 2 (two) times daily.   You will be called with results if BV again, I will treat and start suppressive therapy to prevent recurrence   F/u in 3 weeks for f/u upper respiratory infection   Dr. Adrian Blackwater

## 2016-03-14 NOTE — Assessment & Plan Note (Signed)
Recent BV with persistent discharge Repeat wet prep Urine cytology Plan for suppressive therapy if + for BV

## 2016-03-14 NOTE — Progress Notes (Signed)
Pt is here today for cold symptom: runny nose, cough, sneezing, when breathing back hurts, low grade fevers.

## 2016-03-17 LAB — URINE CYTOLOGY ANCILLARY ONLY
CHLAMYDIA, DNA PROBE: NEGATIVE
Neisseria Gonorrhea: NEGATIVE
TRICH (WINDOWPATH): NEGATIVE

## 2016-03-18 LAB — CERVICOVAGINAL ANCILLARY ONLY: WET PREP (BD AFFIRM): POSITIVE — AB

## 2016-03-18 MED ORDER — METRONIDAZOLE 0.75 % VA GEL: 1.0000 | Freq: Two times a day (BID) | VAGINAL | 5 refills | Status: DC

## 2016-03-18 NOTE — Addendum Note (Signed)
Addended by: Boykin Nearing on: 03/18/2016 05:02 PM   Modules accepted: Orders

## 2016-04-01 ENCOUNTER — Ambulatory Visit (HOSPITAL_COMMUNITY)
Admission: RE | Admit: 2016-04-01 | Discharge: 2016-04-01 | Disposition: A | Discharge status: Home / Self Care | Payer: Self-pay | Source: Ambulatory Visit | Attending: Family Medicine | Admitting: Family Medicine

## 2016-04-01 ENCOUNTER — Ambulatory Visit: Payer: Self-pay | Attending: Family Medicine | Admitting: Family Medicine

## 2016-04-01 VITALS — BP 107/70 | HR 79 | Temp 98.8°F | Resp 20 | Wt 99.4 lb

## 2016-04-01 DIAGNOSIS — J441 Chronic obstructive pulmonary disease with (acute) exacerbation: Secondary | ICD-10-CM | POA: Insufficient documentation

## 2016-04-01 DIAGNOSIS — N760 Acute vaginitis: Secondary | ICD-10-CM

## 2016-04-01 DIAGNOSIS — B9689 Other specified bacterial agents as the cause of diseases classified elsewhere: Secondary | ICD-10-CM

## 2016-04-01 DIAGNOSIS — F17200 Nicotine dependence, unspecified, uncomplicated: Secondary | ICD-10-CM

## 2016-04-01 DIAGNOSIS — R0609 Other forms of dyspnea: Secondary | ICD-10-CM

## 2016-04-01 DIAGNOSIS — Z8249 Family history of ischemic heart disease and other diseases of the circulatory system: Secondary | ICD-10-CM

## 2016-04-01 DIAGNOSIS — R05 Cough: Secondary | ICD-10-CM | POA: Insufficient documentation

## 2016-04-01 DIAGNOSIS — F17210 Nicotine dependence, cigarettes, uncomplicated: Secondary | ICD-10-CM | POA: Insufficient documentation

## 2016-04-01 DIAGNOSIS — J069 Acute upper respiratory infection, unspecified: Secondary | ICD-10-CM | POA: Insufficient documentation

## 2016-04-01 DIAGNOSIS — J449 Chronic obstructive pulmonary disease, unspecified: Secondary | ICD-10-CM

## 2016-04-01 LAB — LIPID PANEL
Cholesterol: 173 mg/dL (ref <200)
HDL: 53 mg/dL (ref >50)
LDL Cholesterol: 100 mg/dL — ABNORMAL HIGH (ref <100)
Total CHOL/HDL Ratio: 3.3 Ratio (ref <5.0)
Triglycerides: 102 mg/dL (ref <150)
VLDL: 20 mg/dL (ref <30)

## 2016-04-01 MED ORDER — ASPIRIN EC 81 MG PO TBEC: 81.0000 mg | DELAYED_RELEASE_TABLET | Freq: Every day | ORAL | 2 refills | Status: DC

## 2016-04-01 MED ORDER — LEVOFLOXACIN 500 MG PO TABS: 500.0000 mg | ORAL_TABLET | Freq: Every day | ORAL | 0 refills | Status: DC

## 2016-04-01 MED ORDER — METRONIDAZOLE 0.75 % VA GEL: 1.0000 | Freq: Two times a day (BID) | VAGINAL | 5 refills | Status: DC

## 2016-04-01 MED ORDER — METHYLPREDNISOLONE 4 MG PO TBPK: ORAL_TABLET | ORAL | 0 refills | Status: DC

## 2016-04-01 NOTE — Progress Notes (Signed)
Subjective:  Patient ID: Cynthia Duran, female    DOB: 12/09/1965  Age: 50 y.o. MRN: [id number]  CC: Cough and URI   HPI Cynthia Duran has severe COPD she presents for   1. COPD exacerbation: she continues to have dyspnea on exertion, chest pains, productive cough and congestion. She reports symptomatic improved with doxycycline that was short term. She continues to smoke. She is compliant with Symbicort.   2. Recurrent BV: still has vaginal discharge. No odor. She is not taking twice weekly metrogel. She is not currently sexually active.   Social History  Substance Use Topics  . Smoking status: Current Every Day Smoker    Packs/day: 1.50    Years: 30.00  . Smokeless tobacco: Never Used  . Alcohol use Yes     Comment: socially    Outpatient Medications Prior to Visit  Medication Sig Dispense Refill  . budesonide-formoterol (SYMBICORT) 160-4.5 MCG/ACT inhaler Inhale 2 puffs into the lungs 2 (two) times daily. 1 Inhaler 6  . PROAIR HFA 108 (90 Base) MCG/ACT inhaler Inhale 1 puff into the lungs. Reported on 10/01/2015  0  . doxycycline (VIBRA-TABS) 100 MG tablet Take 1 tablet (100 mg total) by mouth 2 (two) times daily. (Patient not taking: Reported on 04/01/2016) 20 tablet 0  . metroNIDAZOLE (METROGEL VAGINAL) 0.75 % vaginal gel Place 1 Applicatorful vaginally 2 (two) times daily. Use once daily for 5 days, then twice weekly to prevent BV (Patient not taking: Reported on 04/01/2016) 70 g 5   No facility-administered medications prior to visit.     ROS Review of Systems  Constitutional: Positive for fatigue. Negative for chills and fever.  HENT: Positive for congestion, sinus pain, sinus pressure, sneezing and sore throat.   Eyes: Negative for visual disturbance.  Respiratory: Positive for cough and shortness of breath.   Cardiovascular: Positive for chest pain.  Gastrointestinal: Negative for abdominal pain and blood in stool.  Genitourinary: Positive for vaginal  discharge.  Musculoskeletal: Positive for back pain and neck pain. Negative for arthralgias.  Skin: Negative for rash.  Allergic/Immunologic: Negative for immunocompromised state.  Hematological: Negative for adenopathy. Does not bruise/bleed easily.  Psychiatric/Behavioral: Negative for dysphoric mood and suicidal ideas.    Objective:  BP 107/70 (BP Location: Right Arm, Patient Position: Sitting, Cuff Size: Normal)   Pulse 79   Temp 98.8 F (37.1 C) (Oral)   Resp 20   Wt 99 lb 6.4 oz (45.1 kg)   LMP 09/27/2007 (Exact Date)   SpO2 97%   BMI 19.41 kg/m   BP/Weight 04/01/2016 0000000 [id number]  Systolic BP [id number] -  Diastolic BP 70 76 -  Wt. (Lbs) 99.4 100.6 103  BMI 19.41 19.65 20.12   Physical Exam  Constitutional: She is oriented to person, place, and time. She appears well-developed and well-nourished. No distress.  Thin white female   HENT:  Head: Normocephalic and atraumatic.  Cardiovascular: Normal rate, regular rhythm, normal heart sounds and intact distal pulses.   Pulmonary/Chest: Effort normal and breath sounds normal.  Musculoskeletal: She exhibits no edema.  Neurological: She is alert and oriented to person, place, and time.  Skin: Skin is warm and dry. No rash noted.  Psychiatric: She has a normal mood and affect.   DG chest: no infiltrate  Assessment & Plan:  Cynthia Duran was seen today for cough and uri.  Diagnoses and all orders for this visit:  Chronic obstructive pulmonary disease, unspecified COPD type ([place])  BV (bacterial vaginosis) -  metroNIDAZOLE (METROGEL VAGINAL) 0.75 % vaginal gel; Place 1 Applicatorful vaginally 2 (two) times daily. Use once daily for 5 days, then twice weekly to prevent BV  Family history of early CAD -     aspirin EC 81 MG tablet; Take 1 tablet (81 mg total) by mouth daily. -     Lipid Panel  COPD exacerbation ([place]) -     DG Chest 2 View; Future -     levofloxacin (LEVAQUIN) 500 MG tablet; Take 1 tablet (500 mg  total) by mouth daily. -     methylPREDNISolone (MEDROL DOSEPAK) 4 MG TBPK tablet; Taper per packet insert  Smoker  Exertional dyspnea -     Ambulatory referral to Cardiology   There are no diagnoses linked to this encounter.  No orders of the defined types were placed in this encounter.   Follow-up: Return in about 3 weeks (around 04/22/2016) for COPD exacerbation .   Boykin Nearing MD

## 2016-04-01 NOTE — Patient Instructions (Addendum)
Cynthia Duran was seen today for cough and uri.  Diagnoses and all orders for this visit:  Chronic obstructive pulmonary disease, unspecified COPD type ([place])  BV (bacterial vaginosis) -     metroNIDAZOLE (METROGEL VAGINAL) 0.75 % vaginal gel; Place 1 Applicatorful vaginally 2 (two) times daily. Use once daily for 5 days, then twice weekly to prevent BV  Family history of early CAD -     aspirin EC 81 MG tablet; Take 1 tablet (81 mg total) by mouth daily. -     Lipid Panel  COPD exacerbation ([place]) -     DG Chest 2 View; Future -     levofloxacin (LEVAQUIN) 500 MG tablet; Take 1 tablet (500 mg total) by mouth daily. -     methylPREDNISolone (MEDROL DOSEPAK) 4 MG TBPK tablet; Taper per packet insert  Smoker  Exertional dyspnea -     Ambulatory referral to Cardiology   Add daily aspirin Continue to work towards smoking cessation, this is very important. Call the quit now line to inqiure about patches.   Cardiology referral placed  Smoking cessation support: smoking cessation hotline: 1-800-QUIT-NOW.  Smoking cessation classes are available through [facility]. Call [phone] or visit our website at [email or web address]  F/u in 3 weeks for COPD exacerabation  Dr. Adrian Blackwater   Chronic Obstructive Pulmonary Disease Exacerbation Chronic obstructive pulmonary disease (COPD) is a common lung problem. In COPD, the flow of air from the lungs is limited. COPD exacerbations are times that breathing gets worse and you need extra treatment. Without treatment they can be life threatening. If they happen often, your lungs can become more damaged. If your COPD gets worse, your doctor may treat you with:  Medicines.  Oxygen.  Different ways to clear your airway, such as using a mask. Follow these instructions at home:  Do not smoke.  Avoid tobacco smoke and other things that bother your lungs.  If given, take your antibiotic medicine as told. Finish the medicine  even if you start to feel better.  Only take medicines as told by your doctor.  Drink enough fluids to keep your pee (urine) clear or pale yellow (unless your doctor has told you not to).  Use a cool mist machine (vaporizer).  If you use oxygen or a machine that turns liquid medicine into a mist (nebulizer), continue to use them as told.  Keep up with shots (vaccinations) as told by your doctor.  Exercise regularly.  Eat healthy foods.  Keep all doctor visits as told. Get help right away if:  You are very short of breath and it gets worse.  You have trouble talking.  You have bad chest pain.  You have blood in your spit (sputum).  You have a fever.  You keep throwing up (vomiting).  You feel weak, or you pass out (faint).  You feel confused.  You keep getting worse. This information is not intended to replace advice given to you by your health care provider. Make sure you discuss any questions you have with your health care provider. Document Released: 04/17/2011 Document Revised: 10/04/2015 Document Reviewed: 12/31/2012 Elsevier Interactive Patient Education  2017 [employer].

## 2016-04-01 NOTE — Progress Notes (Signed)
Pt c/o nasal congestion, productive cough, Upper respiratory sx's, and diarrhea. She has noticed green phlegm with cough, unsure if she has a fever but feels like she has one at times. She also states sx's from [place] has not cleared up. " feels the same" Took all of her medication but sx'scame back.

## 2016-04-02 ENCOUNTER — Encounter: Payer: Self-pay | Admitting: Family Medicine

## 2016-04-02 DIAGNOSIS — N760 Acute vaginitis: Secondary | ICD-10-CM

## 2016-04-02 DIAGNOSIS — Z8249 Family history of ischemic heart disease and other diseases of the circulatory system: Secondary | ICD-10-CM | POA: Insufficient documentation

## 2016-04-02 DIAGNOSIS — J441 Chronic obstructive pulmonary disease with (acute) exacerbation: Secondary | ICD-10-CM | POA: Insufficient documentation

## 2016-04-02 DIAGNOSIS — B9689 Other specified bacterial agents as the cause of diseases classified elsewhere: Secondary | ICD-10-CM | POA: Insufficient documentation

## 2016-04-02 NOTE — Assessment & Plan Note (Signed)
COPD exacerbation Treat with levaquin Medrol dosepak CXR

## 2016-04-02 NOTE — Assessment & Plan Note (Signed)
Severe COPD with exertional dyspnea and chest pains Strong fam hx of CAD  Treated current exacerbation Add daily aspirin Referral to cardiology for stress testing

## 2016-04-02 NOTE — Assessment & Plan Note (Signed)
Recurrent BV Twice weekly Metrogel

## 2016-05-09 ENCOUNTER — Telehealth: Payer: Self-pay | Admitting: Family Medicine

## 2016-05-09 DIAGNOSIS — J069 Acute upper respiratory infection, unspecified: Secondary | ICD-10-CM

## 2016-05-09 MED ORDER — CLINDAMYCIN HCL 300 MG PO CAPS: 300.0000 mg | ORAL_CAPSULE | Freq: Three times a day (TID) | ORAL | 0 refills | Status: DC

## 2016-05-09 NOTE — Telephone Encounter (Signed)
Pt calling back following up on when she will be contacted from nurse to discuss her symptoms

## 2016-05-09 NOTE — Telephone Encounter (Signed)
Called patient Verified name and DOB She is having the above listed symptoms along with GI upset  Suspect viral URI and GI She has hx of ruptured ear drum from otitis media and is very concerned about ear pain  Plan: Clindamycin Fluids Rest  OTC analgesics  She agrees with plan and voices understanding

## 2016-05-09 NOTE — Telephone Encounter (Signed)
Ears are hurting and closing off, hurting all over, headache and face pressure. Sx's for 3 days. Blood tinged mucos from nose. Has taken Tylenol sinus. She has not taken temperature but has chills. Pt has COPD.

## 2016-05-09 NOTE — Telephone Encounter (Signed)
Pt calling stating her ears are stopped up and are painful, feels like an ear infection. Pt stated this started about two days ago Pt also states there is blood when she blows her nose

## 2016-09-24 ENCOUNTER — Encounter: Payer: Self-pay | Admitting: Family Medicine

## 2016-10-04 ENCOUNTER — Encounter (HOSPITAL_COMMUNITY): Payer: Self-pay | Admitting: Emergency Medicine

## 2016-10-04 ENCOUNTER — Emergency Department (HOSPITAL_COMMUNITY)
Admission: EM | Admit: 2016-10-04 | Discharge: 2016-10-05 | Disposition: A | Discharge status: Home / Self Care | Payer: Self-pay | Attending: Emergency Medicine | Admitting: Emergency Medicine

## 2016-10-04 DIAGNOSIS — R51 Headache: Secondary | ICD-10-CM | POA: Insufficient documentation

## 2016-10-04 DIAGNOSIS — N9489 Other specified conditions associated with female genital organs and menstrual cycle: Secondary | ICD-10-CM | POA: Insufficient documentation

## 2016-10-04 DIAGNOSIS — F17210 Nicotine dependence, cigarettes, uncomplicated: Secondary | ICD-10-CM | POA: Insufficient documentation

## 2016-10-04 DIAGNOSIS — K5289 Other specified noninfective gastroenteritis and colitis: Secondary | ICD-10-CM | POA: Insufficient documentation

## 2016-10-04 DIAGNOSIS — K529 Noninfective gastroenteritis and colitis, unspecified: Secondary | ICD-10-CM

## 2016-10-04 DIAGNOSIS — J449 Chronic obstructive pulmonary disease, unspecified: Secondary | ICD-10-CM | POA: Insufficient documentation

## 2016-10-04 NOTE — ED Triage Notes (Signed)
Pt c/o chills, headache,  Pink color to urine after putting volataren 1% gel in vagina at 0300 this morning. She thought it was her medicine for a yeast infection. Pt wiped it out with a rag but did not rinse it out.

## 2016-10-04 NOTE — ED Notes (Signed)
Bed: WA08 Expected date:  Expected time:  Means of arrival:  Comments: 

## 2016-10-05 ENCOUNTER — Emergency Department (HOSPITAL_COMMUNITY): Payer: Self-pay

## 2016-10-05 ENCOUNTER — Encounter (HOSPITAL_COMMUNITY): Payer: Self-pay | Admitting: Radiology

## 2016-10-05 LAB — COMPREHENSIVE METABOLIC PANEL
ALT: 11 U/L — ABNORMAL LOW (ref 14–54)
AST: 19 U/L (ref 15–41)
Albumin: 3.8 g/dL (ref 3.5–5.0)
Alkaline Phosphatase: 55 U/L (ref 38–126)
Anion gap: 8 (ref 5–15)
BUN: 5 mg/dL — ABNORMAL LOW (ref 6–20)
CO2: 22 mmol/L (ref 22–32)
Calcium: 8.2 mg/dL — ABNORMAL LOW (ref 8.9–10.3)
Chloride: 111 mmol/L (ref 101–111)
Creatinine, Ser: 0.7 mg/dL (ref 0.44–1.00)
GFR calc Af Amer: >60 mL/min (ref >60)
GFR calc non Af Amer: >60 mL/min (ref >60)
Glucose, Bld: 90 mg/dL (ref 65–99)
Potassium: 3.8 mmol/L (ref 3.5–5.1)
Sodium: 141 mmol/L (ref 135–145)
Total Bilirubin: 0.8 mg/dL (ref 0.3–1.2)
Total Protein: 6.8 g/dL (ref 6.5–8.1)

## 2016-10-05 LAB — CBC WITH DIFFERENTIAL/PLATELET
Basophils Absolute: 0.1 10*3/uL (ref 0.0–0.1)
Basophils Relative: 0 %
Eosinophils Absolute: 0.1 10*3/uL (ref 0.0–0.7)
Eosinophils Relative: 0 %
HCT: 39.8 % (ref 36.0–46.0)
Hemoglobin: 13.5 g/dL (ref 12.0–15.0)
Lymphocytes Relative: 17 %
Lymphs Abs: 3.5 10*3/uL (ref 0.7–4.0)
MCH: 31.2 pg (ref 26.0–34.0)
MCHC: 33.9 g/dL (ref 30.0–36.0)
MCV: 91.9 fL (ref 78.0–100.0)
Monocytes Absolute: 1.5 10*3/uL — ABNORMAL HIGH (ref 0.1–1.0)
Monocytes Relative: 8 %
Neutro Abs: 15.1 10*3/uL — ABNORMAL HIGH (ref 1.7–7.7)
Neutrophils Relative %: 75 %
Platelets: 203 10*3/uL (ref 150–400)
RBC: 4.33 MIL/uL (ref 3.87–5.11)
RDW: 13.4 % (ref 11.5–15.5)
WBC: 20.2 10*3/uL — ABNORMAL HIGH (ref 4.0–10.5)

## 2016-10-05 LAB — URINALYSIS, ROUTINE W REFLEX MICROSCOPIC
Bilirubin Urine: NEGATIVE
Glucose, UA: NEGATIVE mg/dL
Ketones, ur: NEGATIVE mg/dL
Nitrite: NEGATIVE
Protein, ur: NEGATIVE mg/dL
Specific Gravity, Urine: 1.003 — ABNORMAL LOW (ref 1.005–1.030)
pH: 7 (ref 5.0–8.0)

## 2016-10-05 LAB — WET PREP, GENITAL
Clue Cells Wet Prep HPF POC: NONE SEEN
Sperm: NONE SEEN
Trich, Wet Prep: NONE SEEN
Yeast Wet Prep HPF POC: NONE SEEN

## 2016-10-05 MED ORDER — MORPHINE SULFATE (PF) 2 MG/ML IV SOLN
4.0000 mg | Freq: Once | INTRAVENOUS | Status: AC
Start: 2016-10-05 — End: 2016-10-05
  Administered 2016-10-05: 4 mg via INTRAVENOUS
  Filled 2016-10-05: qty 2

## 2016-10-05 MED ORDER — IOPAMIDOL (ISOVUE-300) INJECTION 61%
INTRAVENOUS | Status: AC
  Filled 2016-10-05: qty 100

## 2016-10-05 MED ORDER — IOPAMIDOL (ISOVUE-300) INJECTION 61%
100.0000 mL | Freq: Once | INTRAVENOUS | Status: AC | PRN
  Administered 2016-10-05: 100 mL via INTRAVENOUS

## 2016-10-05 MED ORDER — NAPROXEN 500 MG PO TABS: 500.0000 mg | ORAL_TABLET | Freq: Two times a day (BID) | ORAL | 0 refills | Status: DC

## 2016-10-05 MED ORDER — METRONIDAZOLE 500 MG PO TABS: 500.0000 mg | ORAL_TABLET | Freq: Two times a day (BID) | ORAL | 0 refills | Status: DC

## 2016-10-05 MED ORDER — ONDANSETRON HCL 4 MG PO TABS: 4.0000 mg | ORAL_TABLET | Freq: Four times a day (QID) | ORAL | 0 refills | Status: DC

## 2016-10-05 MED ORDER — SODIUM CHLORIDE 0.9 % IV BOLUS (SEPSIS)
1000.0000 mL | Freq: Once | INTRAVENOUS | Status: AC
  Administered 2016-10-05: 1000 mL via INTRAVENOUS

## 2016-10-05 MED ORDER — DOXYCYCLINE HYCLATE 100 MG PO CAPS: 100.0000 mg | ORAL_CAPSULE | Freq: Two times a day (BID) | ORAL | 0 refills | Status: DC

## 2016-10-05 MED ORDER — OXYCODONE-ACETAMINOPHEN 5-325 MG PO TABS: 1.0000 | ORAL_TABLET | Freq: Four times a day (QID) | ORAL | 0 refills | Status: DC | PRN

## 2016-10-05 NOTE — ED Provider Notes (Signed)
[place] DEPT Provider Note   CSN: [id number] Arrival date & time: 10/04/16  2221     History   Chief Complaint Chief Complaint  Patient presents with  . Vaginal Pain  . Headache    HPI Cynthia Duran is a 51 y.o. female history of chronic vaginal candidiasis, bacterial vaginosis, COPD who presents with a one-day history of vaginal and lower abdominal pain. Patient reports that 2 days ago she began feeling a vaginal irritation with discharge and she accidentally used Voltaren gel instead of her vaginal candidiasis medicine. Since this time, she has felt vaginal pain and irritation, small amount of vaginal bleeding, subjective fevers and chills, lower abdominal pain, nausea, vomiting, and headache. She has had associated fatigue. Patient has not taken any medications at home for her symptoms. Patient called her OB/GYN and poison control who encouraged her to come for evaluation. Patient states she has no concern for sexually transmitted disease. She denies any urinary symptoms. She denies any chest pain, shortness of breath.  HPI  Past Medical History:  Diagnosis Date  . Arthritis Dx 2006  . Frequent UTI   . Knee joint disorder   . Pneumonia   . UTI (lower urinary tract infection)   . Yeast infection     Patient Active Problem List   Diagnosis Date Noted  . BV (bacterial vaginosis) 04/02/2016  . COPD exacerbation ([place]) 04/02/2016  . Family history of early CAD 04/02/2016  . Postmenopausal bleeding 10/01/2015  . Complex ovarian cyst 10/01/2015  . Hypersomnia 09/14/2015  . Exertional dyspnea 09/14/2015  . COPD (chronic obstructive pulmonary disease) ([place]) 09/13/2015  . Fatigue 09/13/2015  . Chronic fatigue 05/24/2015  . Benign mole 05/24/2015  . Osteoarthritis 09/19/2014  . Intermittent abdominal pain 04/18/2014  . Smoker 03/27/2014  . Low back pain 03/27/2014  . Early menopause 03/27/2014    Past Surgical History:  Procedure Laterality Date  . cyst removed  Right 2009    elbow, Dr. Silvio Pate   . LAPAROSCOPIC ABDOMINAL EXPLORATION      OB History    Gravida Para Term Preterm AB Living   4 3 3   1 3    SAB TAB Ectopic Multiple Live Births   1               Home Medications    Prior to Admission medications   Medication Sig Start Date End Date Taking? Authorizing Provider  aspirin EC 81 MG tablet Take 1 tablet (81 mg total) by mouth daily. Patient not taking: Reported on 10/05/2016 04/01/16   Boykin Nearing, MD  budesonide-formoterol (SYMBICORT) 160-4.5 MCG/ACT inhaler Inhale 2 puffs into the lungs 2 (two) times daily. Patient not taking: Reported on 10/05/2016 03/14/16   Boykin Nearing, MD  clindamycin (CLEOCIN) 300 MG capsule Take 1 capsule (300 mg total) by mouth 3 (three) times daily. Patient not taking: Reported on 10/05/2016 05/09/16   Boykin Nearing, MD  doxycycline (VIBRAMYCIN) 100 MG capsule Take 1 capsule (100 mg total) by mouth 2 (two) times daily. 10/05/16   Merve Hotard, Bea Graff, PA-C  methylPREDNISolone (MEDROL DOSEPAK) 4 MG TBPK tablet Taper per packet insert Patient not taking: Reported on 10/05/2016 04/01/16   Boykin Nearing, MD  metroNIDAZOLE (FLAGYL) 500 MG tablet Take 1 tablet (500 mg total) by mouth 2 (two) times daily. 10/05/16   Jahzion Brogden, Bea Graff, PA-C  metroNIDAZOLE (METROGEL VAGINAL) 0.75 % vaginal gel Place 1 Applicatorful vaginally 2 (two) times daily. Use once daily for 5 days, then twice weekly to  prevent BV Patient not taking: Reported on 10/05/2016 04/01/16   Boykin Nearing, MD  naproxen (NAPROSYN) 500 MG tablet Take 1 tablet (500 mg total) by mouth 2 (two) times daily. 10/05/16   Avary Eichenberger, Bea Graff, PA-C  ondansetron (ZOFRAN) 4 MG tablet Take 1 tablet (4 mg total) by mouth every 6 (six) hours. 10/05/16   Mychelle Kendra, Bea Graff, PA-C  oxyCODONE-acetaminophen (PERCOCET/ROXICET) 5-325 MG tablet Take 1-2 tablets by mouth every 6 (six) hours as needed for severe pain. 10/05/16   Frederica Kuster, PA-C    Family History Family  History  Problem Relation Age of Onset  . Arthritis Mother        Rhematoid  . COPD Mother   . Heart disease Father   . Hypertension Father   . Diabetes Maternal Grandmother   . Thyroid disease Maternal Grandmother   . Cancer Maternal Aunt        breast     Social History Social History  Substance Use Topics  . Smoking status: Current Every Day Smoker    Packs/day: 1.50    Years: 30.00  . Smokeless tobacco: Never Used  . Alcohol use Yes     Comment: socially      Allergies   Prednisone and Penicillins   Review of Systems Review of Systems  Constitutional: Positive for chills and fever.  HENT: Negative for facial swelling and sore throat.   Respiratory: Negative for shortness of breath.   Cardiovascular: Negative for chest pain.  Gastrointestinal: Positive for abdominal pain, nausea and vomiting. Negative for blood in stool and diarrhea.  Genitourinary: Positive for pelvic pain, vaginal bleeding, vaginal discharge and vaginal pain. Negative for dysuria and frequency.  Musculoskeletal: Negative for back pain.  Skin: Negative for rash and wound.  Neurological: Negative for headaches.  Psychiatric/Behavioral: The patient is not nervous/anxious.      Physical Exam Updated Vital Signs BP 93/64 (BP Location: Left Arm)   Pulse 77   Temp 98.9 F (37.2 C) (Oral)   Resp 18   Wt 46.7 kg (103 lb)   LMP 09/27/2007 (Exact Date)   SpO2 100%   BMI 20.12 kg/m   Physical Exam  Constitutional: She appears well-developed and well-nourished. No distress.  HENT:  Head: Normocephalic and atraumatic.  Mouth/Throat: Oropharynx is clear and moist. No oropharyngeal exudate.  Eyes: Conjunctivae are normal. Pupils are equal, round, and reactive to light. Right eye exhibits no discharge. Left eye exhibits no discharge. No scleral icterus.  Neck: Normal range of motion. Neck supple. No thyromegaly present.  Cardiovascular: Normal rate, regular rhythm, normal heart sounds and intact  distal pulses.  Exam reveals no gallop and no friction rub.   No murmur heard. Pulmonary/Chest: Effort normal and breath sounds normal. No stridor. No respiratory distress. She has no wheezes. She has no rales.  Abdominal: Soft. Bowel sounds are normal. She exhibits no distension. There is tenderness in the right lower quadrant, suprapubic area and left lower quadrant. There is no rebound, no guarding and no CVA tenderness.  Genitourinary: There is no rash or tenderness on the right labia. There is no rash or tenderness on the left labia. Uterus is tender. Cervix exhibits motion tenderness and discharge (yellow/green). Right adnexum displays tenderness. Right adnexum displays no mass. Left adnexum displays tenderness. Left adnexum displays no mass. There is erythema in the vagina. There are signs of injury (Few areas of mucosal irritation and controlled bleeding on the vaginal walls) in the vagina. Vaginal discharge (yellow/green) found.  Musculoskeletal: She exhibits no edema.  Lymphadenopathy:    She has no cervical adenopathy.  Neurological: She is alert. Coordination normal.  Skin: Skin is warm and dry. No rash noted. She is not diaphoretic. No pallor.  Psychiatric: She has a normal mood and affect.  Nursing note and vitals reviewed.    ED Treatments / Results  Labs (all labs ordered are listed, but only abnormal results are displayed) Labs Reviewed  WET PREP, GENITAL - Abnormal; Notable for the following:       Result Value   WBC, Wet Prep HPF POC MODERATE (*)    All other components within normal limits  CBC WITH DIFFERENTIAL/PLATELET - Abnormal; Notable for the following:    WBC 20.2 (*)    Neutro Abs 15.1 (*)    Monocytes Absolute 1.5 (*)    All other components within normal limits  COMPREHENSIVE METABOLIC PANEL - Abnormal; Notable for the following:    BUN 5 (*)    Calcium 8.2 (*)    ALT 11 (*)    All other components within normal limits  URINALYSIS, ROUTINE W REFLEX  MICROSCOPIC - Abnormal; Notable for the following:    Color, Urine STRAW (*)    Specific Gravity, Urine 1.003 (*)    Hgb urine dipstick SMALL (*)    Leukocytes, UA MODERATE (*)    Bacteria, UA RARE (*)    Squamous Epithelial / LPF 0-5 (*)    All other components within normal limits  GC/CHLAMYDIA PROBE AMP () NOT AT [facility]    EKG  EKG Interpretation None       Radiology Ct Abdomen Pelvis W Contrast  Result Date: 10/05/2016 CLINICAL DATA:  Lower quadrant pain. Frequent urinary tract infections. EXAM: CT ABDOMEN AND PELVIS WITH CONTRAST TECHNIQUE: Multidetector CT imaging of the abdomen and pelvis was performed using the standard protocol following bolus administration of intravenous contrast. CONTRAST:  153mL ISOVUE-300 IOPAMIDOL (ISOVUE-300) INJECTION 61% COMPARISON:  None. FINDINGS: LOWER CHEST: Lung bases are clear. Included heart size is normal. No pericardial effusion. HEPATOBILIARY: Liver and gallbladder are normal. PANCREAS: Normal. SPLEEN: Normal. ADRENALS/URINARY TRACT: Kidneys are orthotopic, demonstrating symmetric enhancement. 2 mm RIGHT interpolar nephrolithiasis No hydronephrosis or solid renal masses. The unopacified ureters are normal in course and caliber. Delayed imaging through the kidneys demonstrates symmetric prompt contrast excretion within the proximal urinary collecting system. Urinary bladder is partially distended and unremarkable. Normal adrenal glands. STOMACH/BOWEL: The stomach, small and large bowel are normal in course and caliber without inflammatory changes. Small and large bowel air-fluid levels. Normal appendix. VASCULAR/LYMPHATIC: Aortoiliac vessels are normal in course and caliber, mild calcific atherosclerosis. No lymphadenopathy by CT size criteria. Prominent pelvic veins and ovarian veins. REPRODUCTIVE: Normal. OTHER: No intraperitoneal free fluid or free air. MUSCULOSKELETAL: Nonacute. Mild bilateral hip osteoarthrosis. Small fat containing  umbilical hernia. IMPRESSION: Air-fluid levels within nondistended bowel associated with enteritis. 2 mm nonobstructing RIGHT nephrolithiasis. CT findings of pelvic congestion syndrome. Electronically Signed   By: Elon Alas M.D.   On: 10/05/2016 06:36    Procedures Procedures (including critical care time)  Medications Ordered in ED Medications  sodium chloride 0.9 % bolus 1,000 mL (0 mLs Intravenous Stopped 10/05/16 0435)  morphine 2 MG/ML injection 4 mg (4 mg Intravenous Given 10/05/16 0335)  iopamidol (ISOVUE-300) 61 % injection 100 mL (100 mLs Intravenous Contrast Given 10/05/16 0601)     Initial Impression / Assessment and Plan / ED Course  I have reviewed the triage vital signs and the  nursing notes.  Pertinent labs & imaging results that were available during my care of the patient were reviewed by me and considered in my medical decision making (see chart for details).     I spoke with Patty at poison control who believes this is probably medical versus medication reaction. She is going to speak with toxicologist and call me back.  I spoke with Andee Poles at [place] control who confirmed that the Voltaren gel should not be causing the patient's symptoms and that a medical cause should be evaluated.  CBC shows WBC 20.2. CMP unremarkable. Wet prep shows moderate WBCs. CT abdomen and pelvis shows enteritis and pelvic congestion syndrome, also 2 mm nonobstructing right nephrolithiasis. Patient discharged with doxycycline, Flagyl to cover for PID, however pain most likely related to pelvic congestion syndrome. We'll discharge home with Naprosyn, Zofran for symptomatic treatment. Short prescription Percocet provided for breakthrough pain. I reviewed the [place] narcotic database and found no discrepancies. Patient follow-up with OB/GYN for further evaluation. Strict return precautions discussed. Patient understands and agrees with plan. Patient also evaluated by Dr. Kathrynn Humble who guided the  patient's management and agrees with plan.  Final Clinical Impressions(s) / ED Diagnoses   Final diagnoses:  Pelvic congestion syndrome  Enteritis    New Prescriptions Discharge Medication List as of 10/05/2016  7:34 AM    START taking these medications   Details  doxycycline (VIBRAMYCIN) 100 MG capsule Take 1 capsule (100 mg total) by mouth 2 (two) times daily., Starting Sun 10/05/2016, Print    metroNIDAZOLE (FLAGYL) 500 MG tablet Take 1 tablet (500 mg total) by mouth 2 (two) times daily., Starting Sun 10/05/2016, Print    naproxen (NAPROSYN) 500 MG tablet Take 1 tablet (500 mg total) by mouth 2 (two) times daily., Starting Sun 10/05/2016, Print    ondansetron (ZOFRAN) 4 MG tablet Take 1 tablet (4 mg total) by mouth every 6 (six) hours., Starting Sun 10/05/2016, Print    oxyCODONE-acetaminophen (PERCOCET/ROXICET) 5-325 MG tablet Take 1-2 tablets by mouth every 6 (six) hours as needed for severe pain., Starting Sun 10/05/2016, Print         Cathleen Yagi, [place], PA-C 10/06/16 [place], Ankit, MD 10/07/16 2219

## 2016-10-05 NOTE — ED Notes (Signed)
Bed: WA21 Expected date:  Expected time:  Means of arrival:  Comments: Room 8

## 2016-10-05 NOTE — Discharge Instructions (Signed)
Medications: doxycycline, Flagyl, naprosyn, Percocet  Treatment: Take doxycycline and Flagyl twice daily for 10 days to treat possible infection. Take naprosyn twice daily for your pain. You can take Percocet every 4-6 hours for any break through pain.  Follow-up: Please see your gynecologist as soon as possible for further evaluation and treatment of your symptoms. Please return to emergency department if you develop any new or worsening symptoms.

## 2016-10-07 LAB — GC/CHLAMYDIA PROBE AMP (~~LOC~~) NOT AT ARMC
Chlamydia: NEGATIVE
Neisseria Gonorrhea: NEGATIVE

## 2016-10-13 ENCOUNTER — Encounter: Payer: Self-pay | Admitting: Family Medicine

## 2016-10-13 ENCOUNTER — Ambulatory Visit: Payer: Self-pay | Attending: Family Medicine

## 2016-10-13 ENCOUNTER — Ambulatory Visit (INDEPENDENT_AMBULATORY_CARE_PROVIDER_SITE_OTHER): Payer: Self-pay | Admitting: Family Medicine

## 2016-10-13 VITALS — BP 120/84 | HR 94 | Wt 100.5 lb

## 2016-10-13 DIAGNOSIS — R1030 Lower abdominal pain, unspecified: Secondary | ICD-10-CM

## 2016-10-13 DIAGNOSIS — Z720 Tobacco use: Secondary | ICD-10-CM

## 2016-10-13 DIAGNOSIS — N950 Postmenopausal bleeding: Secondary | ICD-10-CM

## 2016-10-13 NOTE — Progress Notes (Signed)
   Subjective:    Patient ID: Cynthia Duran is a 51 y.o. female presenting with Gynecologic Exam  on 10/13/2016  HPI: Here for f/u following Voltaren gel being placed in her vagina. She had a CT which showed enteritis and kidney stone and ? Pelvic congestion syndrome. As a young woman had pain and placed on OC's and had laparoscopy done.  Normal per her report. No improvement with IUD or pregnancy. Has slow transit constipation. Reports feeling bloated through the day, worse as the day goes on. Notes no blood in her stool. Has bowel movement every 2-4 days. Pain is unrelated to bowel movement. Notes increasing gas without change in diet.   Review of Systems  Constitutional: Negative for chills and fever.  Respiratory: Negative for shortness of breath.   Cardiovascular: Negative for chest pain.  Gastrointestinal: Negative for abdominal pain, nausea and vomiting.  Genitourinary: Negative for dysuria.  Skin: Negative for rash.      Objective:    BP 120/84   Pulse 94   Wt 100 lb 8 oz (45.6 kg)   LMP 09/27/2007 (Exact Date)   BMI 19.63 kg/m  Physical Exam  Constitutional: She is oriented to person, place, and time. She appears well-developed and well-nourished. No distress.  HENT:  Head: Normocephalic and atraumatic.  Eyes: No scleral icterus.  Neck: Neck supple.  Cardiovascular: Normal rate.   Pulmonary/Chest: Effort normal.  Abdominal: Soft.  Genitourinary:  Genitourinary Comments: BUS normal, vagina is pale and atrophic, cervix is small without lesion, uterus is small and anteverted, no adnexal mass or tenderness.   Neurological: She is alert and oriented to person, place, and time.  Skin: Skin is warm and dry.  Psychiatric: She has a normal mood and affect.        Assessment & Plan:   Problem List Items Addressed This Visit      Unprioritized   Abdominal pain - Primary    Has pelvic congestion and enteritis on CT--given her postmenopausal state will start with GI  referral. If no diagnosis or no relief will refer to IR for possible treatment of pelvic congestion.      Relevant Orders   Ambulatory referral to Gastroenterology   Postmenopausal bleeding    If further bleeding, return for further work-up      Tobacco use    Smoking cessation counseling done.         Total face-to-face time with patient: 30 minutes. Over 50% of encounter was spent on counseling and coordination of care. Return if symptoms worsen or fail to improve.  Donnamae Jude 10/13/2016 3:11 PM

## 2016-10-13 NOTE — Patient Instructions (Signed)
Steps to Quit Smoking Smoking tobacco can be harmful to your health and can affect almost every organ in your body. Smoking puts you, and those around you, at risk for developing many serious chronic diseases. Quitting smoking is difficult, but it is one of the best things that you can do for your health. It is never too late to quit. What are the benefits of quitting smoking? When you quit smoking, you lower your risk of developing serious diseases and conditions, such as:  Lung cancer or lung disease, such as COPD.  Heart disease.  Stroke.  Heart attack.  Infertility.  Osteoporosis and bone fractures.  Additionally, symptoms such as coughing, wheezing, and shortness of breath may get better when you quit. You may also find that you get sick less often because your body is stronger at fighting off colds and infections. If you are pregnant, quitting smoking can help to reduce your chances of having a baby of low birth weight. How do I get ready to quit? When you decide to quit smoking, create a plan to make sure that you are successful. Before you quit:  Pick a date to quit. Set a date within the next two weeks to give you time to prepare.  Write down the reasons why you are quitting. Keep this list in places where you will see it often, such as on your bathroom mirror or in your car or wallet.  Identify the people, places, things, and activities that make you want to smoke (triggers) and avoid them. Make sure to take these actions: ? Throw away all cigarettes at home, at work, and in your car. ? Throw away smoking accessories, such as ashtrays and lighters. ? Clean your car and make sure to empty the ashtray. ? Clean your home, including curtains and carpets.  Tell your family, friends, and coworkers that you are quitting. Support from your loved ones can make quitting easier.  Talk with your health care provider about your options for quitting smoking.  Find out what treatment  options are covered by your health insurance.  What strategies can I use to quit smoking? Talk with your healthcare provider about different strategies to quit smoking. Some strategies include:  Quitting smoking altogether instead of gradually lessening how much you smoke over a period of time. Research shows that quitting "cold turkey" is more successful than gradually quitting.  Attending in-person counseling to help you build problem-solving skills. You are more likely to have success in quitting if you attend several counseling sessions. Even short sessions of 10 minutes can be effective.  Finding resources and support systems that can help you to quit smoking and remain smoke-free after you quit. These resources are most helpful when you use them often. They can include: ? Online chats with a counselor. ? Telephone quitlines. ? Printed self-help materials. ? Support groups or group counseling. ? Text messaging programs. ? Mobile phone applications.  Taking medicines to help you quit smoking. (If you are pregnant or breastfeeding, talk with your health care provider first.) Some medicines contain nicotine and some do not. Both types of medicines help with cravings, but the medicines that include nicotine help to relieve withdrawal symptoms. Your health care provider may recommend: ? Nicotine patches, gum, or lozenges. ? Nicotine inhalers or sprays. ? Non-nicotine medicine that is taken by mouth.  Talk with your health care provider about combining strategies, such as taking medicines while you are also receiving in-person counseling. Using these two strategies together   makes you more likely to succeed in quitting than if you used either strategy on its own. If you are pregnant or breastfeeding, talk with your health care provider about finding counseling or other support strategies to quit smoking. Do not take medicine to help you quit smoking unless told to do so by your health care  provider. What things can I do to make it easier to quit? Quitting smoking might feel overwhelming at first, but there is a lot that you can do to make it easier. Take these important actions:  Reach out to your family and friends and ask that they support and encourage you during this time. Call telephone quitlines, reach out to support groups, or work with a counselor for support.  Ask people who smoke to avoid smoking around you.  Avoid places that trigger you to smoke, such as bars, parties, or smoke-break areas at work.  Spend time around people who do not smoke.  Lessen stress in your life, because stress can be a smoking trigger for some people. To lessen stress, try: ? Exercising regularly. ? Deep-breathing exercises. ? Yoga. ? Meditating. ? Performing a body scan. This involves closing your eyes, scanning your body from head to toe, and noticing which parts of your body are particularly tense. Purposefully relax the muscles in those areas.  Download or purchase mobile phone or tablet apps (applications) that can help you stick to your quit plan by providing reminders, tips, and encouragement. There are many free apps, such as QuitGuide from the CDC (Centers for Disease Control and Prevention). You can find other support for quitting smoking (smoking cessation) through smokefree.gov and other websites.  How will I feel when I quit smoking? Within the first 24 hours of quitting smoking, you may start to feel some withdrawal symptoms. These symptoms are usually most noticeable 2-3 days after quitting, but they usually do not last beyond 2-3 weeks. Changes or symptoms that you might experience include:  Mood swings.  Restlessness, anxiety, or irritation.  Difficulty concentrating.  Dizziness.  Strong cravings for sugary foods in addition to nicotine.  Mild weight gain.  Constipation.  Nausea.  Coughing or a sore throat.  Changes in how your medicines work in your  body.  A depressed mood.  Difficulty sleeping (insomnia).  After the first 2-3 weeks of quitting, you may start to notice more positive results, such as:  Improved sense of smell and taste.  Decreased coughing and sore throat.  Slower heart rate.  Lower blood pressure.  Clearer skin.  The ability to breathe more easily.  Fewer sick days.  Quitting smoking is very challenging for most people. Do not get discouraged if you are not successful the first time. Some people need to make many attempts to quit before they achieve long-term success. Do your best to stick to your quit plan, and talk with your health care provider if you have any questions or concerns. This information is not intended to replace advice given to you by your health care provider. Make sure you discuss any questions you have with your health care provider. Document Released: 04/22/2001 Document Revised: 12/25/2015 Document Reviewed: 09/12/2014 Elsevier Interactive Patient Education  2017 Elsevier Inc.  

## 2016-10-13 NOTE — Assessment & Plan Note (Signed)
If further bleeding, return for further work-up

## 2016-10-13 NOTE — Assessment & Plan Note (Signed)
Smoking cessation counseling done. 

## 2016-10-13 NOTE — Assessment & Plan Note (Signed)
Has pelvic congestion and enteritis on CT--given her postmenopausal state will start with GI referral. If no diagnosis or no relief will refer to IR for possible treatment of pelvic congestion.

## 2016-10-15 NOTE — Addendum Note (Signed)
Addended by: Riccardo Dubin on: 10/15/2016 01:22 PM   Modules accepted: Orders

## 2017-01-03 IMAGING — US US PELVIS COMPLETE
1 series · 15 of 25 positions shown · non-contrast
Comparison: Abdominal ultrasound 05/08/2014

CLINICAL DATA: Patient with history of postmenopausal bleeding.



[Series 1: us pelvis complete · 15 of 67 slices shown]
[im 1/67]
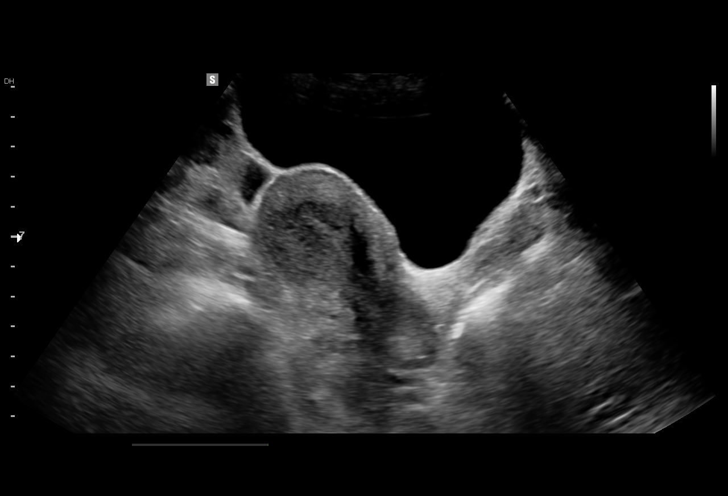
[im 6/67]
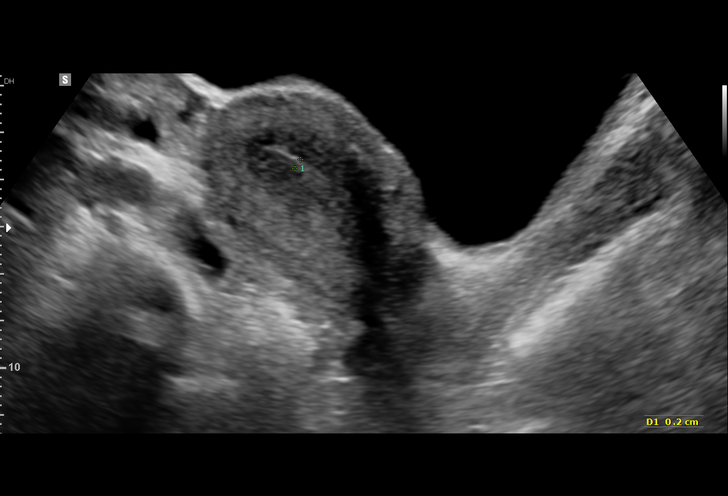
[im 12/67]
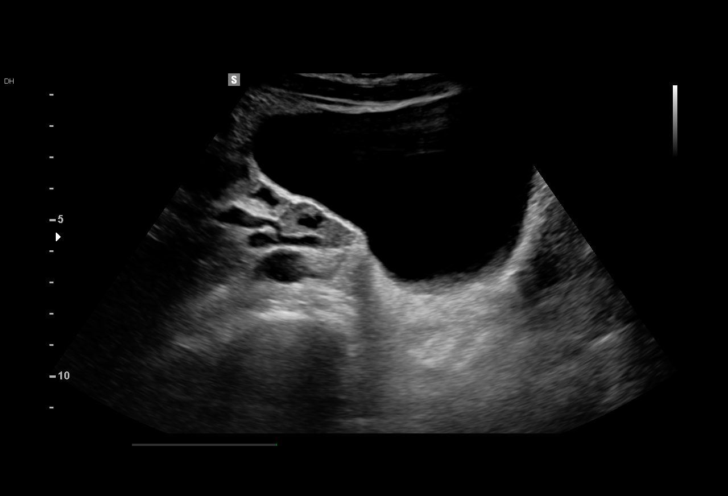
[im 14/67]
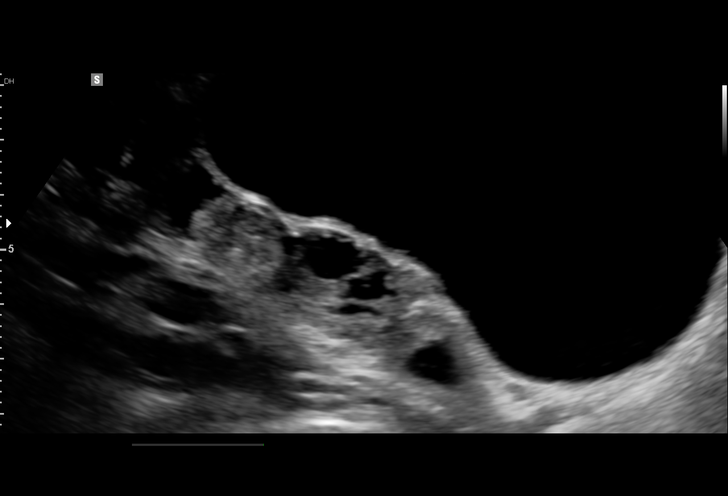
[im 20/67]
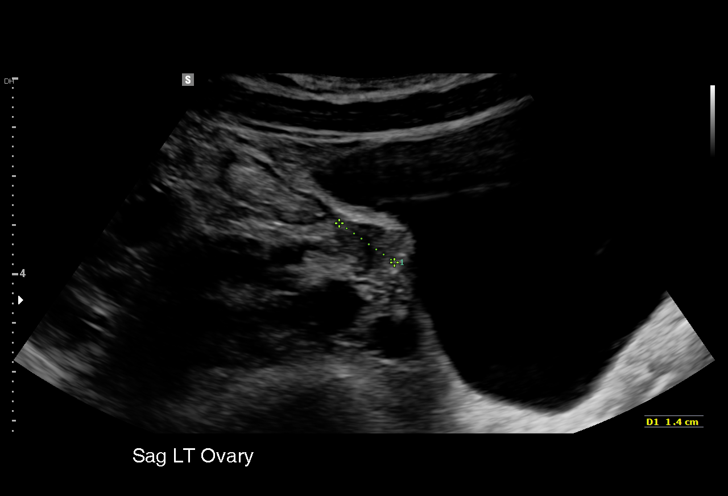
[im 25/67]
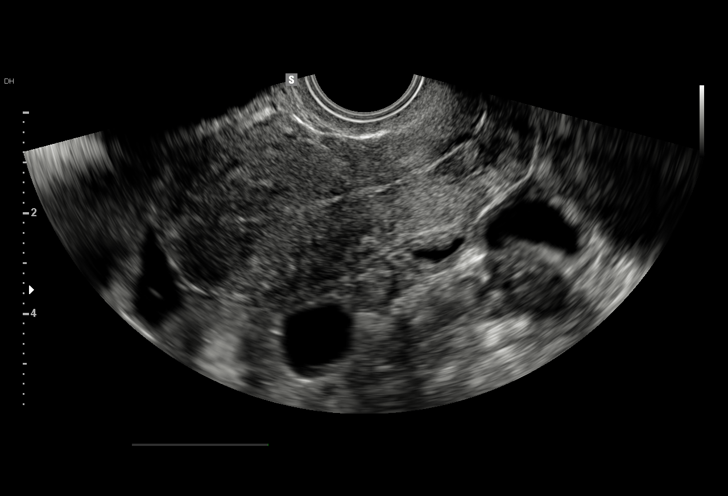
[im 28/67]
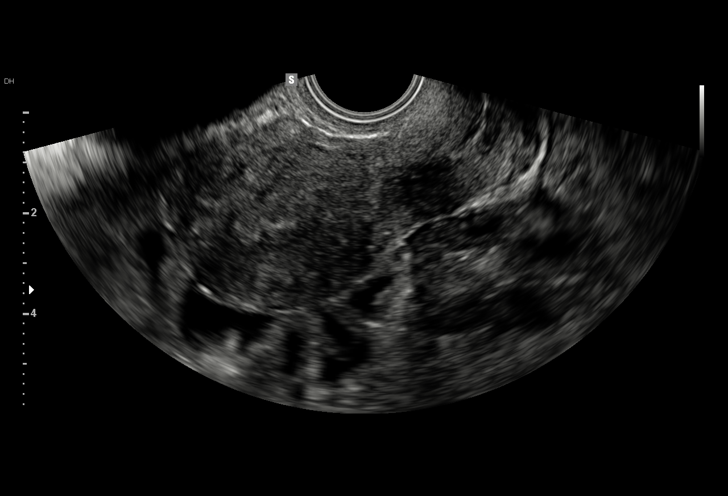
[im 34/67]
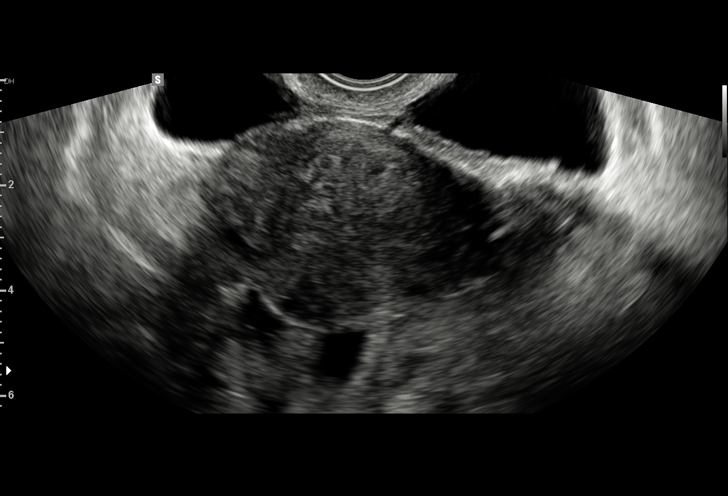
[im 39/67]
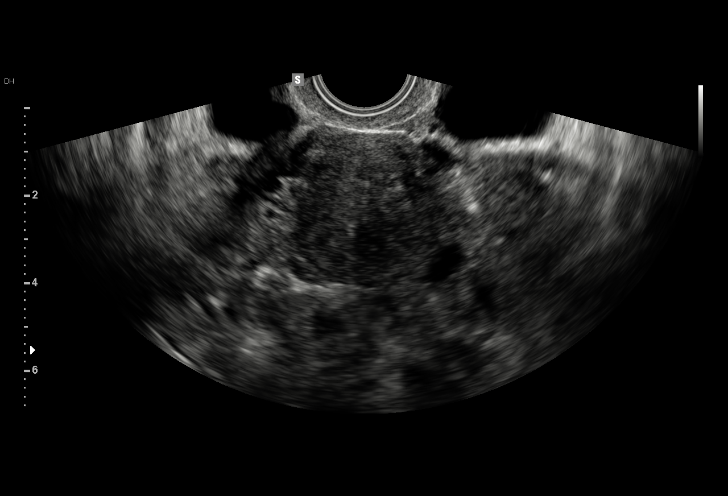
[im 42/67]
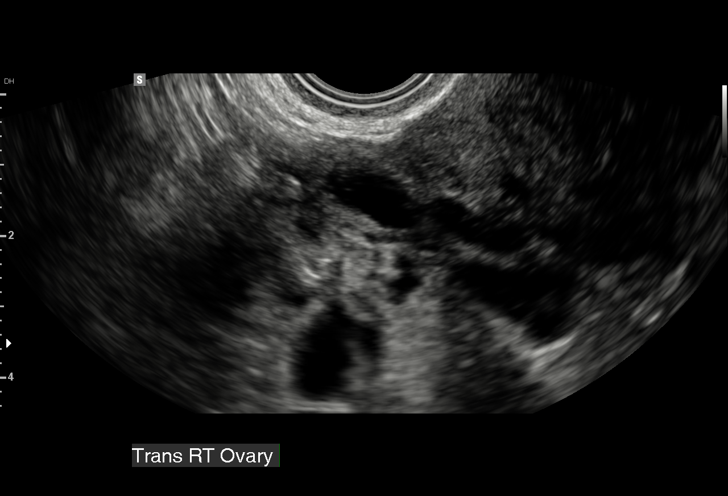
[im 47/67]
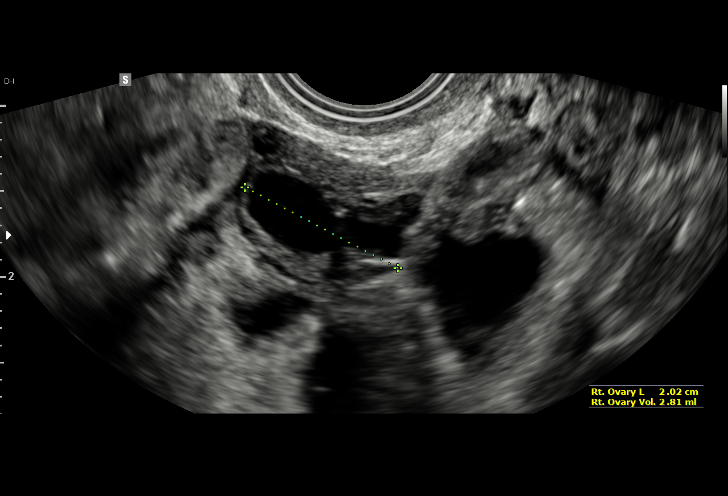
[im 53/67]
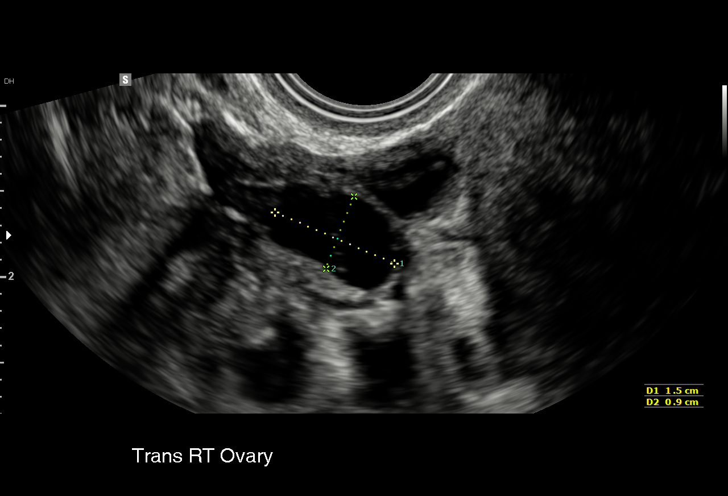
[im 56/67]
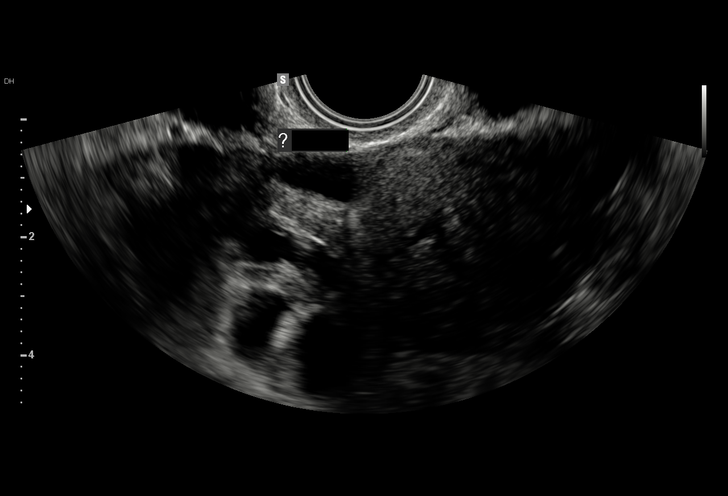
[im 61/67]
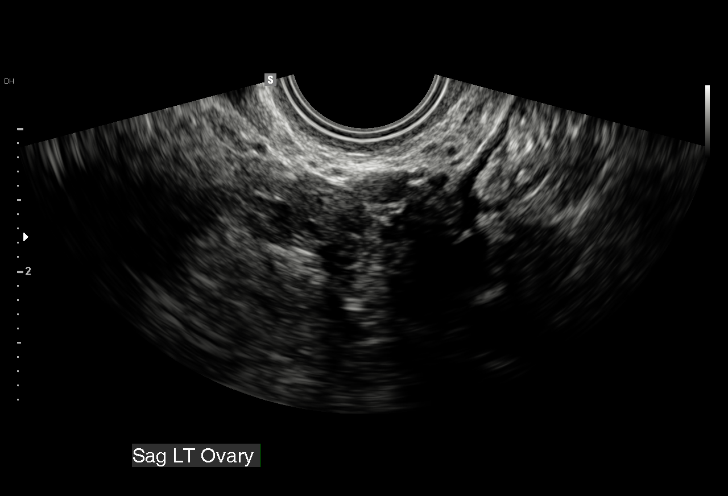
[im 67/67]
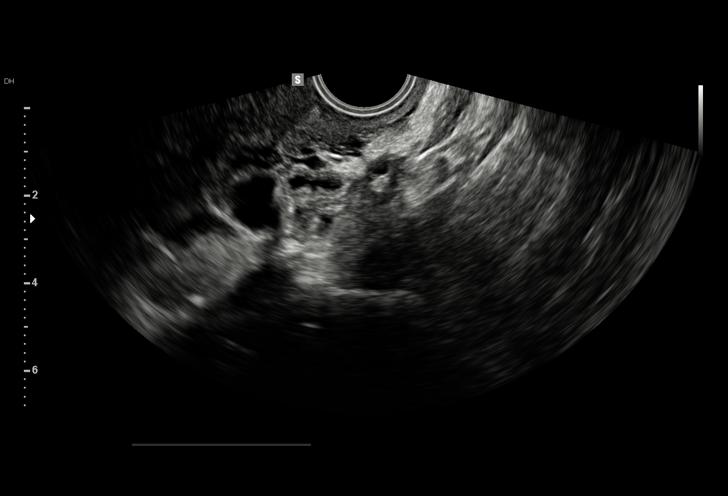

[15 of 25 positions shown; findings below may reference images not displayed]

FINDINGS: Uterus

Measurements: 7.4 x 3.8 x 4.0 cm. No fibroids or other mass
visualized.

Endometrium

Thickness: 4 mm.  No focal abnormality visualized.

Right ovary

Measurements: 2.0 x 1.3 x 2.1 cm. There is a 1.1 x 1.5 x 0.9 cm
right ovarian cyst. Suggestion of an internal septation. There is a
single focus of color within the septation on color Doppler imaging.
This may represent artifact or possibly small amount of vascular
flow.

Left ovary

Measurements: 1.3 x 1.4 x 0.9 cm. Normal appearance/no adnexal mass.

Other findings

No abnormal free fluid.
IMPRESSION: Endometrium measures 4 mm. In the setting of post-menopausal
bleeding, this is consistent with a benign etiology such as
endometrial atrophy. If bleeding remains unresponsive to hormonal or
medical therapy, sonohysterogram should be considered for focal
lesion work-up. (Ref: Radiological Reasoning: Algorithmic Workup of
Abnormal Vaginal Bleeding with Endovaginal Sonography and
Sonohysterography. AJR [id number]; 191:S68-73)

Within the right ovary there is a 1.5 cm cyst with an internal
septation. Possible small amount of internal flow within the
septation versus artifact. Given the overall sonographic findings,
recommend initial evaluation with repeat ultrasound of the pelvis in
6- 8 weeks to assess for interval change/resolution.

## 2017-02-09 ENCOUNTER — Ambulatory Visit: Payer: Self-pay | Attending: Internal Medicine | Admitting: Internal Medicine

## 2017-02-09 ENCOUNTER — Encounter: Payer: Self-pay | Admitting: Internal Medicine

## 2017-02-09 VITALS — BP 113/74 | HR 78 | Temp 98.4°F | Resp 16 | Wt 97.6 lb

## 2017-02-09 DIAGNOSIS — F17210 Nicotine dependence, cigarettes, uncomplicated: Secondary | ICD-10-CM | POA: Insufficient documentation

## 2017-02-09 DIAGNOSIS — N898 Other specified noninflammatory disorders of vagina: Secondary | ICD-10-CM | POA: Insufficient documentation

## 2017-02-09 DIAGNOSIS — Z7951 Long term (current) use of inhaled steroids: Secondary | ICD-10-CM | POA: Insufficient documentation

## 2017-02-09 DIAGNOSIS — A049 Bacterial intestinal infection, unspecified: Secondary | ICD-10-CM | POA: Insufficient documentation

## 2017-02-09 DIAGNOSIS — Z2821 Immunization not carried out because of patient refusal: Secondary | ICD-10-CM

## 2017-02-09 DIAGNOSIS — D229 Melanocytic nevi, unspecified: Secondary | ICD-10-CM

## 2017-02-09 DIAGNOSIS — Z8719 Personal history of other diseases of the digestive system: Secondary | ICD-10-CM

## 2017-02-09 DIAGNOSIS — F17200 Nicotine dependence, unspecified, uncomplicated: Secondary | ICD-10-CM

## 2017-02-09 DIAGNOSIS — J449 Chronic obstructive pulmonary disease, unspecified: Secondary | ICD-10-CM | POA: Insufficient documentation

## 2017-02-09 DIAGNOSIS — Z7982 Long term (current) use of aspirin: Secondary | ICD-10-CM | POA: Insufficient documentation

## 2017-02-09 DIAGNOSIS — D289 Benign neoplasm of female genital organ, unspecified: Secondary | ICD-10-CM | POA: Insufficient documentation

## 2017-02-09 DIAGNOSIS — Z880 Allergy status to penicillin: Secondary | ICD-10-CM | POA: Insufficient documentation

## 2017-02-09 DIAGNOSIS — Z8619 Personal history of other infectious and parasitic diseases: Secondary | ICD-10-CM

## 2017-02-09 MED ORDER — FLUCONAZOLE 150 MG PO TABS: 150.0000 mg | ORAL_TABLET | Freq: Once | ORAL | 0 refills | Status: AC

## 2017-02-09 MED ORDER — ALBUTEROL SULFATE HFA 108 (90 BASE) MCG/ACT IN AERS: 2.0000 | INHALATION_SPRAY | Freq: Four times a day (QID) | RESPIRATORY_TRACT | 6 refills | Status: DC | PRN

## 2017-02-09 MED ORDER — BUDESONIDE-FORMOTEROL FUMARATE 160-4.5 MCG/ACT IN AERO: 2.0000 | INHALATION_SPRAY | Freq: Two times a day (BID) | RESPIRATORY_TRACT | 6 refills | Status: DC

## 2017-02-09 MED FILL — SYMBICORT 160-4.5 MCG INH: 160-4.5 | 30 days supply | Qty: 10 | Fill #0

## 2017-02-09 NOTE — Progress Notes (Signed)
Patient ID: ROCHELL PUETT, female    DOB: Apr 02, 1966  MRN: [id number]  CC: re-establish; Sinus Problem; and Vaginal Discharge   Subjective: Cynthia Duran is a 51 y.o. female who presents for chronic ds management Her concerns today include:  hx of COPD, tob  1. COPD -Still smokes but down to a little over 1 pk from a 1 1/4 pk/day.  Started smoking since age 82 yrs. "I enjoy it." Nonetheless she feels the need to quit for the betterment of her health -started using nicotine patch today 21 mg -out of Symbicort and Albuterol for a while -endorses cough sometimes with color.  Last PFT 09/2015 -had a cold x 1 wk. Had some pain behind eye 1 wk ago Low grade fever. No sneezing Does not want flu shot  2. Feels she has vaginal infection  -endorses creamy disch and some itching. Not sexually active in past 9 yrs -mole on RT labia for a few years. Thinks it is getting bigger and more irregular shape  3. Referred to GI for enteritis by GYN in 10/2016. Pt was called but did not have insurance at the time. Now has [facility] discount and would like to move foreward with that. -no abdominal pains at this time  Patient Active Problem List   Diagnosis Date Noted  . Tobacco use 10/13/2016  . Family history of early CAD 04/02/2016  . Postmenopausal bleeding 10/01/2015  . Complex ovarian cyst 10/01/2015  . Hypersomnia 09/14/2015  . Exertional dyspnea 09/14/2015  . COPD (chronic obstructive pulmonary disease) ([place]) 09/13/2015  . Chronic fatigue 05/24/2015  . Benign mole 05/24/2015  . Osteoarthritis 09/19/2014  . Abdominal pain 04/18/2014  . Smoker 03/27/2014  . Low back pain 03/27/2014  . Early menopause 03/27/2014     Current Outpatient Prescriptions on File Prior to Visit  Medication Sig Dispense Refill  . aspirin EC 81 MG tablet Take 1 tablet (81 mg total) by mouth daily. (Patient not taking: Reported on 10/13/2016) 30 tablet 2   No current facility-administered medications on file  prior to visit.     Allergies  Allergen Reactions  . Prednisone Other (See Comments)    Pt reports "homicidal thoughts"  . Penicillins Rash    Has patient had a PCN reaction causing immediate rash, facial/tongue/throat swelling, SOB or lightheadedness with hypotension: Yes Has patient had a PCN reaction causing severe rash involving mucus membranes or skin necrosis: No Has patient had a PCN reaction that required hospitalization No Has patient had a PCN reaction occurring within the last 10 years: No If all of the above answers are "NO", then may proceed with Cephalosporin use.     Social History   Social History  . Marital status: Single    Spouse name: N/A  . Number of children: 3   . Years of education: GED    Occupational History  . Unemployed     Social History Main Topics  . Smoking status: Current Every Day Smoker    Packs/day: 1.50    Years: 30.00  . Smokeless tobacco: Never Used  . Alcohol use Yes     Comment: socially   . Drug use: No  . Sexual activity: Not Currently    Birth control/ protection: Post-menopausal   Other Topics Concern  . Not on file   Social History Narrative   3 children-   Age 78, 43, 37.       Has 49, 56 yo grandchild.    Mother passed away  in 08/2013.           Family History  Problem Relation Age of Onset  . Arthritis Mother        Rhematoid  . COPD Mother   . Heart disease Father   . Hypertension Father   . Diabetes Maternal Grandmother   . Thyroid disease Maternal Grandmother   . Cancer Maternal Aunt        breast     Past Surgical History:  Procedure Laterality Date  . cyst removed Right 2009    elbow, Dr. Silvio Pate   . LAPAROSCOPIC ABDOMINAL EXPLORATION      ROS: Review of Systems Negative except as stated above PHYSICAL EXAM: BP 113/74   Pulse 78   Temp 98.4 F (36.9 C) (Oral)   Resp 16   Wt 97 lb 9.6 oz (44.3 kg)   LMP 09/27/2007 (Exact Date)   SpO2 97%   BMI 19.06 kg/m   Physical Exam General  appearance - alert, well appearing, and in no distress Mental status - alert, oriented to person, place, and time, normal mood, behavior, speech, dress, motor activity, and thought processes Neck - supple, no significant adenopathy Chest - breath sounds slightly decreased bilaterally but without wheezes, crackles, or rhonchi  Heart - normal rate, regular rhythm, normal S1, S2, no murmurs, rubs, clicks or gallops Pelvic -CMA [place] present: flat hyperpigemented 1/2-1 cm size mole RT lower labia Extremities - peripheral pulses normal, no pedal edema, no clubbing or cyanosis   ASSESSMENT AND PLAN: 1. Chronic obstructive pulmonary disease, unspecified COPD type ([place]) encourage smoking cessation - budesonide-formoterol (SYMBICORT) 160-4.5 MCG/ACT inhaler; Inhale 2 puffs into the lungs 2 (two) times daily.  Dispense: 1 Inhaler; Refill: 6 - albuterol (PROVENTIL HFA;VENTOLIN HFA) 108 (90 Base) MCG/ACT inhaler; Inhale 2 puffs into the lungs every 6 (six) hours as needed for wheezing or shortness of breath.  Dispense: 1 Inhaler; Refill: 6  2. Influenza vaccination declined   3. Tobacco dependence Patient advised to quit smoking. Discussed health risks associated with smoking including lung and other types of cancers, chronic lung diseases and CV risks.. Pt trying to quit. Just started using nicotine replacement therapy. I recommend that she continue with the 21 mg patches for 1-2 months then try stepping down to the 14 mg patches for 1-2 months then the 7 mg.  Less than 5 minutes spent on counseling  4. Vaginal discharge Will treat empirically for yeast with Diflucan - Urine cytology ancillary only - fluconazole (DIFLUCAN) 150 MG tablet; Take 1 tablet (150 mg total) by mouth once.  Dispense: 1 tablet; Refill: 0  5. Change in mole in genital area - Ambulatory referral to Gynecology  6. Hx of bacterial enteritis -I sent message to referral coordinator about the GI referral that was submitted in  June.   Patient was given the opportunity to ask questions.  Patient verbalized understanding of the plan and was able to repeat key elements of the plan.   Orders Placed This Encounter  Procedures  . Ambulatory referral to Gynecology     Requested Prescriptions   Signed Prescriptions Disp Refills  . budesonide-formoterol (SYMBICORT) 160-4.5 MCG/ACT inhaler 1 Inhaler 6    Sig: Inhale 2 puffs into the lungs 2 (two) times daily.  Marland Kitchen albuterol (PROVENTIL HFA;VENTOLIN HFA) 108 (90 Base) MCG/ACT inhaler 1 Inhaler 6    Sig: Inhale 2 puffs into the lungs every 6 (six) hours as needed for wheezing or shortness of breath.  . fluconazole (DIFLUCAN) 150  MG tablet 1 tablet 0    Sig: Take 1 tablet (150 mg total) by mouth once.    Return in about 4 months (around 06/12/2017).  Karle Plumber, MD, FACP

## 2017-02-09 NOTE — Patient Instructions (Signed)
Continue to work on trying to quit smoking.  You can do the 21 mg patches daily for 2 months then 14 mg  For two months then 7 mg for two months.  I have refilled your inhalers.

## 2017-02-12 ENCOUNTER — Other Ambulatory Visit: Payer: Self-pay | Admitting: Internal Medicine

## 2017-02-12 LAB — URINE CYTOLOGY ANCILLARY ONLY
BACTERIAL VAGINITIS: POSITIVE — AB
Candida vaginitis: NEGATIVE

## 2017-02-12 MED ORDER — METRONIDAZOLE 500 MG PO TABS: 500.0000 mg | ORAL_TABLET | Freq: Two times a day (BID) | ORAL | 0 refills | Status: DC

## 2017-02-16 ENCOUNTER — Telehealth: Payer: Self-pay | Admitting: Internal Medicine

## 2017-02-16 DIAGNOSIS — K529 Noninfective gastroenteritis and colitis, unspecified: Secondary | ICD-10-CM

## 2017-02-16 NOTE — Telephone Encounter (Signed)
Will forward to pcp

## 2017-02-16 NOTE — Telephone Encounter (Signed)
Pt. Called requesting to know the status of her referral for the GI. Spoke with Cynthia Duran and she stated that her PCP has to put a new referral for this month due to [facility] only accepting referral for the current month. Please f/u

## 2017-02-26 ENCOUNTER — Encounter: Payer: Self-pay | Admitting: Physician Assistant

## 2017-03-09 ENCOUNTER — Other Ambulatory Visit (INDEPENDENT_AMBULATORY_CARE_PROVIDER_SITE_OTHER): Payer: Self-pay

## 2017-03-09 ENCOUNTER — Encounter: Payer: Self-pay | Admitting: Physician Assistant

## 2017-03-09 ENCOUNTER — Ambulatory Visit (INDEPENDENT_AMBULATORY_CARE_PROVIDER_SITE_OTHER): Payer: Self-pay | Admitting: Physician Assistant

## 2017-03-09 VITALS — BP 110/70 | HR 77 | Ht 60.0 in | Wt 98.0 lb

## 2017-03-09 DIAGNOSIS — K5909 Other constipation: Secondary | ICD-10-CM

## 2017-03-09 DIAGNOSIS — R140 Abdominal distension (gaseous): Secondary | ICD-10-CM

## 2017-03-09 DIAGNOSIS — R1084 Generalized abdominal pain: Secondary | ICD-10-CM

## 2017-03-09 LAB — COMPREHENSIVE METABOLIC PANEL
ALBUMIN: 4.5 g/dL (ref 3.5–5.2)
ALT: 9 U/L (ref 0–35)
AST: 16 U/L (ref 0–37)
Alkaline Phosphatase: 66 U/L (ref 39–117)
BILIRUBIN TOTAL: 0.5 mg/dL (ref 0.2–1.2)
BUN: 7 mg/dL (ref 6–23)
CALCIUM: 9.5 mg/dL (ref 8.4–10.5)
CHLORIDE: 103 meq/L (ref 96–112)
CO2: 30 mEq/L (ref 19–32)
CREATININE: 0.61 mg/dL (ref 0.40–1.20)
GFR: 110.79 mL/min (ref >60.00)
Glucose, Bld: 91 mg/dL (ref 70–99)
Potassium: 4.2 mEq/L (ref 3.5–5.1)
Sodium: 141 mEq/L (ref 135–145)
Total Protein: 7.1 g/dL (ref 6.0–8.3)

## 2017-03-09 LAB — CBC WITH DIFFERENTIAL/PLATELET
BASOS PCT: 1.3 % (ref 0.0–3.0)
Basophils Absolute: 0.1 10*3/uL (ref 0.0–0.1)
EOS ABS: 0.2 10*3/uL (ref 0.0–0.7)
EOS PCT: 2.4 % (ref 0.0–5.0)
HCT: 44.5 % (ref 36.0–46.0)
HEMOGLOBIN: 15.1 g/dL — AB (ref 12.0–15.0)
LYMPHS PCT: 40.7 % (ref 12.0–46.0)
Lymphs Abs: 3.1 10*3/uL (ref 0.7–4.0)
MCHC: 33.9 g/dL (ref 30.0–36.0)
MCV: 93.6 fl (ref 78.0–100.0)
MONOS PCT: 9.5 % (ref 3.0–12.0)
Monocytes Absolute: 0.7 10*3/uL (ref 0.1–1.0)
Neutro Abs: 3.6 10*3/uL (ref 1.4–7.7)
Neutrophils Relative %: 46.1 % (ref 43.0–77.0)
Platelets: 201 10*3/uL (ref 150.0–400.0)
RBC: 4.75 Mil/uL (ref 3.87–5.11)
RDW: 13.3 % (ref 11.5–15.5)
WBC: 7.7 10*3/uL (ref 4.0–10.5)

## 2017-03-09 LAB — SEDIMENTATION RATE: Sed Rate: 4 mm/hr (ref 0–20)

## 2017-03-09 LAB — HIGH SENSITIVITY CRP: CRP HIGH SENSITIVITY: 0.67 mg/L (ref 0.000–5.000)

## 2017-03-09 LAB — IGA: IgA: 119 mg/dL (ref 68–378)

## 2017-03-09 NOTE — Progress Notes (Signed)
Subjective:    Patient ID: Cynthia Duran, female    DOB: [birth year], 51 y.o.   MRN: [id number]  HPI Cynthia Duran is a 51 year old white female, new to GI today referred by Dr. Neoma Laming Johnson/[facility] for evaluation of abdominal pain bloating and distention. Patient has not had any prior GI evaluation. She is a smoker, has history of mild COPD, prior complex ovarian cyst and chronic fatigue. She says she has been having her current symptoms for several years, but symptoms have progressed over the past several months. She describes mid abdominal pain radiating to the lower abdomen. She says she is miserable anytime she eats and "hates to eat". Says pre-much anything she eats causes bloating and distention and generalized discomfort for after meals she also has a more constant lower abdominal discomfort that never goes away. She says she feels okay early in the a.m. until after she eats. She also generally will feel better after a bowel movement. She has not noted any melena or hematochezia. Her appetite has been fair, weight stable. She is unaware of any specific food triggers other than lactulose which she tries to avoid. She's not using any artificial sweeteners. No regular aspirin or NSAIDs. Family history is pertinent for a couple of cousins and a paternal uncle with Crohn's disease. There is no family history of celiac disease that she is aware of. Patient had a CT scan done of the abdomen and pelvis in May 2018 when she went to the emergency room with acute pelvic/vaginal pain. 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 Aortoiliac vessels were read as normal in course and caliber with mild atherosclerosis.  Review of Systems Pertinent positive and negative review of systems were noted in the above HPI section.  All other  review of systems was otherwise negative.  Outpatient Encounter Prescriptions as of 03/09/2017  Medication Sig  . albuterol (PROVENTIL HFA;VENTOLIN HFA) 108 (90 Base) MCG/ACT inhaler Inhale 2 puffs into the lungs every 6 (six) hours as needed for wheezing or shortness of breath.  Marland Kitchen aspirin EC 81 MG tablet Take 1 tablet (81 mg total) by mouth daily.  . budesonide-formoterol (SYMBICORT) 160-4.5 MCG/ACT inhaler Inhale 2 puffs into the lungs 2 (two) times daily.  . [DISCONTINUED] metroNIDAZOLE (FLAGYL) 500 MG tablet Take 1 tablet (500 mg total) by mouth 2 (two) times daily.   No facility-administered encounter medications on file as of 03/09/2017.    Allergies  Allergen Reactions  . Prednisone Other (See Comments)    Pt reports "homicidal thoughts"  . Penicillins Rash    Has patient had a PCN reaction causing immediate rash, facial/tongue/throat swelling, SOB or lightheadedness with hypotension: Yes Has patient had a PCN reaction causing severe rash involving mucus membranes or skin necrosis: No Has patient had a PCN reaction that required hospitalization No Has patient had a PCN reaction occurring within the last 10 years: No If all of the above answers are "NO", then may proceed with Cephalosporin use.    Patient Active Problem List   Diagnosis Date Noted  . Tobacco use 10/13/2016  . Family history of early CAD 04/02/2016  . Complex ovarian cyst 10/01/2015  . Hypersomnia 09/14/2015  . COPD (chronic obstructive pulmonary disease) ([place]) 09/13/2015  . Chronic fatigue 05/24/2015  . Benign mole 05/24/2015  . Osteoarthritis 09/19/2014  . Smoker 03/27/2014  .  Low back pain 03/27/2014  . Early menopause 03/27/2014   Social History   Social History  . Marital status: Single    Spouse name: N/A  . Number of children: 3   . Years of education: GED    Occupational History  . Unemployed     Social History Main Topics  . Smoking status: Current Every Day Smoker    Packs/day: 1.50     Years: 30.00  . Smokeless tobacco: Never Used  . Alcohol use Yes     Comment: socially   . Drug use: No  . Sexual activity: Not Currently    Birth control/ protection: Post-menopausal   Other Topics Concern  . Not on file   Social History Narrative   3 children-   Age 72, 67, 72.       Has 11, 30 yo grandchild.    Mother passed away in 11-Sep-2013.           Cynthia Duran family history includes Arthritis in her mother; COPD in her mother; Cancer in her maternal aunt; Diabetes in her maternal grandmother; Heart disease in her father; Hypertension in her father; Thyroid disease in her maternal grandmother.      Objective:    Vitals:   03/09/17 1036  BP: 110/70  Pulse: 77    Physical Exam well-developed small thin white female in no acute distress, pleasant blood pressure 110/70 pulse 77, Height 5 foot, weight 98, BMI 19.1. HEENT; nontraumatic normocephalic EOMI PERRLA sclera anicteric, Cardiovascular; regular rate and rhythm with S1-S2 no murmur rub or gallop, Pulmonary; clear bilaterally, Abdomen; flat,soft, she some mild bilateral lower quadrant tenderness no guarding or rebound no palpable mass or hepatosplenomegaly bowel sounds are present, Rectal; exam not done, Extremities ;no clubbing cyanosis or edema skin warm and dry, Neuropsych ;mood and affect appropriate       Assessment & Plan:   #55 51 year old white female with several year history of postprandial abdominal pain bloating and distention. Patient generally feels best early in the morning before she eats anything and symptoms progress throughout the day. Rule out IBD, rule out IBS, rule out celiac disease. 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 Fuller Plan. Procedure discussed in detail with the patient including risks and benefits and she is agreeable to proceed. We'll check sedimentation rate, CRP, CBC with differential, TTG and IgA Start MiraLAX 17 g in 8 ounces of water daily to see if increasing bowel motility will improve symptoms Patient has GYN appointment scheduled and advised to follow through. Lactose avoidance Further plans pending results of above.   Amy S Esterwood PA-C 03/09/2017   Cc: Ladell Pier, MD

## 2017-03-09 NOTE — Patient Instructions (Signed)
Please go to the basement level to have your labs drawn.  Take Miralax, 17 grams in 8 oz of water daily.  You have been scheduled for a colonoscopy. Please follow written instructions given to you at your visit today.  We have provided you with a prep sample for the colonoscopy. If you use inhalers (even only as needed), please bring them with you on the day of your procedure. Your physician has requested that you go to www.startemmi.com and enter the access code given to you at your visit today. This web site gives a general overview about your procedure. However, you should still follow specific instructions given to you by our office regarding your preparation for the procedure.

## 2017-03-09 NOTE — Progress Notes (Signed)
Reviewed and agree with management plan.  Wilbon Obenchain T. Oluwatosin Higginson, MD FACG 

## 2017-03-11 ENCOUNTER — Ambulatory Visit (AMBULATORY_SURGERY_CENTER): Payer: Self-pay | Admitting: Gastroenterology

## 2017-03-11 ENCOUNTER — Encounter: Payer: Self-pay | Admitting: Gastroenterology

## 2017-03-11 VITALS — BP 106/68 | HR 74 | Temp 98.7°F | Resp 13 | Ht 60.0 in | Wt 95.0 lb

## 2017-03-11 DIAGNOSIS — K635 Polyp of colon: Secondary | ICD-10-CM

## 2017-03-11 DIAGNOSIS — R1084 Generalized abdominal pain: Secondary | ICD-10-CM

## 2017-03-11 DIAGNOSIS — K5900 Constipation, unspecified: Secondary | ICD-10-CM

## 2017-03-11 DIAGNOSIS — D124 Benign neoplasm of descending colon: Secondary | ICD-10-CM

## 2017-03-11 MED ORDER — SODIUM CHLORIDE 0.9 % IV SOLN: 500.0000 mL | INTRAVENOUS | Status: DC

## 2017-03-11 NOTE — Progress Notes (Signed)
Called to room to assist during endoscopic procedure.  Patient ID and intended procedure confirmed with present staff. Received instructions for my participation in the procedure from the performing physician. maw 

## 2017-03-11 NOTE — Progress Notes (Signed)
Report given to PACU, vss 

## 2017-03-11 NOTE — Patient Instructions (Signed)
YOU HAD AN ENDOSCOPIC PROCEDURE TODAY AT [facility]:   Refer to the procedure report that was given to you for any specific questions about what was found during the examination.  If the procedure report does not answer your questions, please call your gastroenterologist to clarify.  If you requested that your care partner not be given the details of your procedure findings, then the procedure report has been included in a sealed envelope for you to review at your convenience later.  YOU SHOULD EXPECT: Some feelings of bloating in the abdomen. Passage of more gas than usual.  Walking can help get rid of the air that was put into your GI tract during the procedure and reduce the bloating. If you had a lower endoscopy (such as a colonoscopy or flexible sigmoidoscopy) you may notice spotting of blood in your stool or on the toilet paper. If you underwent a bowel prep for your procedure, you may not have a normal bowel movement for a few days.  Please Note:  You might notice some irritation and congestion in your nose or some drainage.  This is from the oxygen used during your procedure.  There is no need for concern and it should clear up in a day or so.  SYMPTOMS TO REPORT IMMEDIATELY:   Following lower endoscopy (colonoscopy or flexible sigmoidoscopy):  Excessive amounts of blood in the stool  Significant tenderness or worsening of abdominal pains  Swelling of the abdomen that is new, acute  Fever of 100F or higher    For urgent or emergent issues, a gastroenterologist can be reached at any hour by calling [phone].   DIET:  We do recommend a small meal at first, but then you may proceed to your regular diet.  Drink plenty of fluids but you should avoid alcoholic beverages for 24 hours.  ACTIVITY:  You should plan to take it easy for the rest of today and you should NOT DRIVE or use heavy machinery until tomorrow (because of the sedation medicines used during the test).     FOLLOW UP: Our staff will call the number listed on your records the next business day following your procedure to check on you and address any questions or concerns that you may have regarding the information given to you following your procedure. If we do not reach you, we will leave a message.  However, if you are feeling well and you are not experiencing any problems, there is no need to return our call.  We will assume that you have returned to your regular daily activities without incident.  If any biopsies were taken you will be contacted by phone or by letter within the next 1-3 weeks.  Please call us at [phone] if you have not heard about the biopsies in 3 weeks.    SIGNATURES/CONFIDENTIALITY: You and/or your care partner have signed paperwork which will be entered into your electronic medical record.  These signatures attest to the fact that that the information above on your After Visit Summary has been reviewed and is understood.  Full responsibility of the confidentiality of this discharge information lies with you and/or your care-partner.  NO aspirin,ibuprofen,naproxen,or other non-steroidal anti-inflammatory for 2 weeks but resume remainder of medications.Information given on polyps.

## 2017-03-11 NOTE — Op Note (Signed)
[facility] Patient Name: Cynthia Duran Procedure Date: 03/11/2017 2:41 PM MRN: [id number] Endoscopist: Ladene Artist , MD Age: 51 Referring MD:  Date of Birth: [birth year] Gender: Female Account #: [id number] Procedure:                Colonoscopy Indications:              Generalized abdominal pain, Constipation Medicines:                Monitored Anesthesia Care Procedure:                Pre-Anesthesia Assessment:                           - Prior to the procedure, a History and Physical                            was performed, and patient medications and                            allergies were reviewed. The patient's tolerance of                            previous anesthesia was also reviewed. The risks                            and benefits of the procedure and the sedation                            options and risks were discussed with the patient.                            All questions were answered, and informed consent                            was obtained. Prior Anticoagulants: The patient has                            taken no previous anticoagulant or antiplatelet                            agents. ASA Grade Assessment: II - A patient with                            mild systemic disease. After reviewing the risks                            and benefits, the patient was deemed in                            satisfactory condition to undergo the procedure.                           After obtaining informed consent, the colonoscope  was passed under direct vision. Throughout the                            procedure, the patient's blood pressure, pulse, and                            oxygen saturations were monitored continuously. The                            Colonoscope was introduced through the anus and                            advanced to the the cecum, identified by                            appendiceal orifice and  ileocecal valve. The                            terminal ileum, ileocecal valve, appendiceal                            orifice, and rectum were photographed. The quality                            of the bowel preparation was adequate after                            extensive lavage and suctioning. The patient                            tolerated the procedure well. The colonoscopy was                            somewhat difficult due to spasm. Successful                            completion of the procedure was aided by lavage. Scope In: 2:50:18 PM Scope Out: 3:08:40 PM Scope Withdrawal Time: 0 hours 15 minutes 45 seconds  Total Procedure Duration: 0 hours 18 minutes 22 seconds  Findings:                 The perianal and digital rectal examinations were                            normal.                           A 12 mm polyp was found in the descending colon.                            The polyp was sessile. The polyp was removed with a                            hot snare. Resection and retrieval were complete.  The terminal ileum appeared normal.                           The exam was otherwise without abnormality on                            direct and retroflexion views. Complications:            No immediate complications. Estimated blood loss:                            None. Estimated Blood Loss:     Estimated blood loss: none. Impression:               - One 12 mm polyp in the descending colon, removed                            with a hot snare. Resected and retrieved.                           - Normal appearing terminal ileum.                           - The examination was otherwise normal on direct                            and retroflexion views. Recommendation:           - Repeat colonoscopy in 3 years for surveillance if                            polyp is precancerous, otherwise 10 years, with a                            more extensive  bowel prep.                           - Patient has a contact number available for                            emergencies. The signs and symptoms of potential                            delayed complications were discussed with the                            patient. Return to normal activities tomorrow.                            Written discharge instructions were provided to the                            patient.                           - Resume previous diet.                           -  Continue present medications.                           - Await pathology results.                           - No aspirin, ibuprofen, naproxen, or other                            non-steroidal anti-inflammatory drugs for 2 weeks                            after polyp removal. Ladene Artist, MD 03/11/2017 3:14:12 PM This report has been signed electronically.

## 2017-03-12 ENCOUNTER — Telehealth: Payer: Self-pay

## 2017-03-12 LAB — TISSUE TRANSGLUTAMINASE, IGG: (tTG) Ab, IgG: 1 U/mL

## 2017-03-12 NOTE — Telephone Encounter (Signed)
  Follow up Call-  Call back number 03/11/2017  Post procedure Call Back phone  # [phone]  Permission to leave phone message Yes  Some recent data might be hidden     Patient questions:  Do you have a fever, pain , or abdominal swelling? No. Pain Score  0 *  Have you tolerated food without any problems? Yes.    Have you been able to return to your normal activities? Yes.    Do you have any questions about your discharge instructions: Diet   No. Medications  No. Follow up visit  No.  Do you have questions or concerns about your Care? No.  Actions: * If pain score is 4 or above: No action needed, pain <4.  No problems noted per pt. maw

## 2017-03-19 ENCOUNTER — Other Ambulatory Visit (HOSPITAL_COMMUNITY)
Admission: RE | Admit: 2017-03-19 | Discharge: 2017-03-19 | Disposition: A | Discharge status: Home / Self Care | Payer: Self-pay | Source: Ambulatory Visit | Attending: Family Medicine | Admitting: Family Medicine

## 2017-03-19 ENCOUNTER — Encounter: Payer: Self-pay | Admitting: Family Medicine

## 2017-03-19 ENCOUNTER — Ambulatory Visit (INDEPENDENT_AMBULATORY_CARE_PROVIDER_SITE_OTHER): Payer: Self-pay | Admitting: Family Medicine

## 2017-03-19 VITALS — BP 149/79 | HR 73 | Wt 98.5 lb

## 2017-03-19 DIAGNOSIS — N9089 Other specified noninflammatory disorders of vulva and perineum: Secondary | ICD-10-CM

## 2017-03-19 DIAGNOSIS — Z124 Encounter for screening for malignant neoplasm of cervix: Secondary | ICD-10-CM

## 2017-03-19 DIAGNOSIS — L819 Disorder of pigmentation, unspecified: Secondary | ICD-10-CM

## 2017-03-19 DIAGNOSIS — N898 Other specified noninflammatory disorders of vagina: Secondary | ICD-10-CM

## 2017-03-19 DIAGNOSIS — R140 Abdominal distension (gaseous): Secondary | ICD-10-CM

## 2017-03-19 DIAGNOSIS — A630 Anogenital (venereal) warts: Secondary | ICD-10-CM

## 2017-03-19 DIAGNOSIS — Z1151 Encounter for screening for human papillomavirus (HPV): Secondary | ICD-10-CM

## 2017-03-19 NOTE — Progress Notes (Signed)
Subjective:    Patient ID: Cynthia Duran is a 51 y.o. female presenting with vaginal issue  on 03/19/2017  HPI: Has had a GI referral with normal colonoscopy. She has some pelvic congestion syndrome diagnosed on CT. She is reporting bloating and other GI symptoms which get worse with food intake. She is menopausal. Most patients with this experience pelvic discomfort which is worse with exercise or prolonged standing, painful intercourse. It is hard to make this be the problem with her symptoms. Reports mole on the outside which needs to be looked at. She reports changes in size and color. Notes some spots on the inside as well. Needs pap. Reports some vaginal discharge--h/o recurrent BV.  Review of Systems  Constitutional: Negative for chills and fever.  Respiratory: Negative for shortness of breath.   Cardiovascular: Negative for chest pain.  Gastrointestinal: Positive for abdominal distention and abdominal pain. Negative for nausea and vomiting.  Genitourinary: Positive for vaginal discharge. Negative for dysuria.  Skin: Negative for rash.      Objective:    BP (!) 149/79   Pulse 73   Wt 98 lb 8 oz (44.7 kg)   LMP 09/27/2007 (Exact Date)   BMI 19.24 kg/m  Physical Exam  Constitutional: She is oriented to person, place, and time. She appears well-developed and well-nourished. No distress.  HENT:  Head: Normocephalic and atraumatic.  Eyes: No scleral icterus.  Neck: Neck supple.  Cardiovascular: Normal rate.  Pulmonary/Chest: Effort normal.  Abdominal: Soft.  Genitourinary:     Genitourinary Comments: There is some atrophy, but otherwise normal external genitalia.  Neurological: She is alert and oriented to person, place, and time.  Skin: Skin is warm and dry.  Psychiatric: She has a normal mood and affect.   Procedure: Patient identified, informed consent signed, copy in chart, time out performed.    Area cleansed with Alcohol.  Injected with 1% Lidocaine with  Epi.  2 mL. Area cleaned with Betadine and 3 mm punch biopsy performed without difficulty.  Hemostasis obtained with Silver Nitrate.  Patient tolerated procedure well.      Assessment & Plan:   Problem List Items Addressed This Visit      Unprioritized   Abdominal bloating    And continued pain. Does have appearance of PCS on CT--but I do not think this is causing her pain, especially since she is menopausal and usually symptoms improve after menopause. Also, this is a common finding on CT and not diagnostic of PCS. The treatment options for this include medical treatment with progesterones and IR. With no insurance IR is not going to be an option. She is not a good candidate for hormones with her smoking, age, FH of CAD.       Other Visit Diagnoses    Screening for cervical cancer    -  Primary   Relevant Orders   Cytology - PAP   Vaginal discharge       check wet prep   Relevant Orders   Cytology - PAP   Vulvar lesion       She does tan and I suspect this is damage related to that. Await biopsy results.   Hyperpigmented skin lesion       Relevant Orders   Surgical pathology     Mammogram scholarship info given.  Total face-to-face time with patient: 15 minutes. Over 50% of encounter was spent on counseling and coordination of care. Return if symptoms worsen or fail to improve.  Lavella Lemons  Virginia Crews 03/19/2017 3:36 PM

## 2017-03-20 ENCOUNTER — Encounter: Payer: Self-pay | Admitting: Gastroenterology

## 2017-03-20 ENCOUNTER — Encounter: Payer: Self-pay | Admitting: Family Medicine

## 2017-03-20 DIAGNOSIS — R140 Abdominal distension (gaseous): Secondary | ICD-10-CM | POA: Insufficient documentation

## 2017-03-20 LAB — CERVICOVAGINAL ANCILLARY ONLY
BACTERIAL VAGINITIS: POSITIVE — AB
Candida vaginitis: NEGATIVE
TRICH (WINDOWPATH): NEGATIVE

## 2017-03-20 NOTE — Assessment & Plan Note (Addendum)
And continued pain. Does have appearance of PCS on CT--but I do not think this is causing her pain, especially since she is menopausal and usually symptoms improve after menopause. Also, this is a common finding on CT and not diagnostic of PCS. The treatment options for this include medical treatment with progesterones and IR. With no insurance IR is not going to be an option. She is not a good candidate for hormones with her smoking, age, FH of CAD.

## 2017-03-23 ENCOUNTER — Encounter: Payer: Self-pay | Admitting: Family Medicine

## 2017-03-23 ENCOUNTER — Other Ambulatory Visit: Payer: Self-pay | Admitting: Family Medicine

## 2017-03-23 ENCOUNTER — Ambulatory Visit (INDEPENDENT_AMBULATORY_CARE_PROVIDER_SITE_OTHER): Payer: Self-pay | Admitting: Family Medicine

## 2017-03-23 VITALS — BP 133/78 | HR 83 | Ht 60.0 in | Wt 98.0 lb

## 2017-03-23 DIAGNOSIS — Z5189 Encounter for other specified aftercare: Secondary | ICD-10-CM

## 2017-03-23 DIAGNOSIS — R102 Pelvic and perineal pain: Secondary | ICD-10-CM

## 2017-03-23 DIAGNOSIS — N760 Acute vaginitis: Principal | ICD-10-CM

## 2017-03-23 DIAGNOSIS — B9689 Other specified bacterial agents as the cause of diseases classified elsewhere: Secondary | ICD-10-CM

## 2017-03-23 MED ORDER — METRONIDAZOLE 500 MG PO TABS: 500.0000 mg | ORAL_TABLET | Freq: Two times a day (BID) | ORAL | 0 refills | Status: DC

## 2017-03-23 NOTE — Progress Notes (Signed)
   Subjective:    Patient ID: Cynthia Duran is a 51 y.o. female presenting with Vaginal Pain (possible infection from recent biopsy)  on 03/23/2017  HPI: Here for f/u. S/p 73mm punch biopsy of Vulva on Thursday. Notes swelling and tenderness of area.  Review of Systems  Constitutional: Negative for chills and fever.  Gastrointestinal: Negative for abdominal pain, nausea and vomiting.  Skin: Positive for wound.      Objective:    BP 133/78   Pulse 83   Ht 5' (1.524 m)   Wt 98 lb (44.5 kg)   LMP 09/27/2007 (Exact Date)   BMI 19.14 kg/m  Physical Exam  Constitutional: She appears well-developed and well-nourished.  Pulmonary/Chest: Effort normal.  Abdominal: Soft.  Genitourinary:  Genitourinary Comments: Small amount of induration, normal grayish color following AgNO3. No purulence. No erythema.        Assessment & Plan:  Visit for wound check - likely normal healing. sitz baths, neosporin to area. Call with erythema or fever.   Total face-to-face time with patient: 10 minutes. Over 50% of encounter was spent on counseling and coordination of care. Return if symptoms worsen or fail to improve.  Donnamae Jude 03/23/2017 12:53 PM

## 2017-03-24 LAB — CYTOLOGY - PAP
DIAGNOSIS: NEGATIVE
HPV (WINDOPATH): NOT DETECTED

## 2017-03-31 ENCOUNTER — Other Ambulatory Visit: Payer: Self-pay | Admitting: Family Medicine

## 2017-03-31 DIAGNOSIS — Z1231 Encounter for screening mammogram for malignant neoplasm of breast: Secondary | ICD-10-CM

## 2017-04-21 ENCOUNTER — Ambulatory Visit (HOSPITAL_COMMUNITY)
Admission: EM | Admit: 2017-04-21 | Discharge: 2017-04-21 | Disposition: A | Discharge status: Home / Self Care | Payer: Self-pay | Attending: Physician Assistant | Admitting: Physician Assistant

## 2017-04-21 ENCOUNTER — Encounter (HOSPITAL_COMMUNITY): Payer: Self-pay | Admitting: Emergency Medicine

## 2017-04-21 DIAGNOSIS — H6991 Unspecified Eustachian tube disorder, right ear: Secondary | ICD-10-CM

## 2017-04-21 DIAGNOSIS — H6981 Other specified disorders of Eustachian tube, right ear: Secondary | ICD-10-CM

## 2017-04-21 DIAGNOSIS — J441 Chronic obstructive pulmonary disease with (acute) exacerbation: Secondary | ICD-10-CM

## 2017-04-21 MED ORDER — CETIRIZINE-PSEUDOEPHEDRINE ER 5-120 MG PO TB12: 1.0000 | ORAL_TABLET | Freq: Two times a day (BID) | ORAL | 0 refills | Status: AC

## 2017-04-21 MED ORDER — DOXYCYCLINE HYCLATE 100 MG PO CAPS: 100.0000 mg | ORAL_CAPSULE | Freq: Two times a day (BID) | ORAL | 0 refills | Status: AC

## 2017-04-21 NOTE — ED Provider Notes (Signed)
04/21/2017 6:10 PM   DOB: April 17, 1966 / MRN: [id number]  SUBJECTIVE:  Cynthia Duran is a 51 y.o. female presenting for three weeks of cough and sore throat.  Ear pain with pressure sensation of the right side. Lots of sneezing and nasal congestion. She is sleeping well. She is a smoker of about 30 years at roughly one pack daily. She has been diagnosed with COPD.   She is allergic to prednisone and penicillins.   She  has a past medical history of Arthritis (Dx 2006), Frequent UTI, Knee joint disorder, Pneumonia, UTI (lower urinary tract infection), and Yeast infection.    She  reports that she has been smoking.  She has a 45.00 pack-year smoking history. she has never used smokeless tobacco. She reports that she drinks alcohol. She reports that she does not use drugs. She  reports that she does not currently engage in sexual activity. She reports using the following method of birth control/protection: Post-menopausal. The patient  has a past surgical history that includes cyst removed (Right, 2009 ) and Laparoscopic abdominal exploration.  Her family history includes Arthritis in her mother; COPD in her mother; Cancer in her maternal aunt; Diabetes in her maternal grandmother; Heart disease in her father; Hypertension in her father; Thyroid disease in her maternal grandmother.  Review of Systems  Constitutional: Negative for chills and fever.  HENT: Positive for congestion, ear pain, sinus pain and sore throat. Negative for ear discharge and nosebleeds.   Respiratory: Positive for cough.   Cardiovascular: Negative for chest pain.  Gastrointestinal: Negative for nausea.  Skin: Negative for rash.    OBJECTIVE:  BP 109/77   Pulse 89   Temp 98.6 F (37 C) (Temporal)   Resp 16   Ht 5' (1.524 m)   Wt 98 lb (44.5 kg)   LMP 09/27/2007 (Exact Date)   SpO2 99%   BMI 19.14 kg/m   Physical Exam  Constitutional: She appears well-developed and well-nourished. She is active.  Non-toxic  appearance.  HENT:  Right Ear: Hearing, tympanic membrane, external ear and ear canal normal.  Left Ear: Hearing, tympanic membrane, external ear and ear canal normal.  Nose: Nose normal. Right sinus exhibits no maxillary sinus tenderness and no frontal sinus tenderness. Left sinus exhibits no maxillary sinus tenderness and no frontal sinus tenderness.  Mouth/Throat: Uvula is midline, oropharynx is clear and moist and mucous membranes are normal. Mucous membranes are not dry. No oropharyngeal exudate, posterior oropharyngeal edema or tonsillar abscesses.  Cardiovascular: Normal rate, regular rhythm, S1 normal, S2 normal, normal heart sounds and intact distal pulses. Exam reveals no gallop, no friction rub and no decreased pulses.  No murmur heard. Pulmonary/Chest: Effort normal and breath sounds normal. No stridor. No tachypnea. No respiratory distress. She has no wheezes. She has no rales. She exhibits no tenderness.  Abdominal: She exhibits no distension.  Musculoskeletal: She exhibits no edema.  Lymphadenopathy:       Head (right side): No submandibular and no tonsillar adenopathy present.       Head (left side): No submandibular and no tonsillar adenopathy present.    She has no cervical adenopathy.  Neurological: She is alert.  Skin: Skin is warm and dry. She is not diaphoretic. No pallor.    No results found for this or any previous visit (from the past 72 hour(s)).  No results found.  ASSESSMENT AND PLAN:  The primary encounter diagnosis was COPD exacerbation ([place]). A diagnosis of Dysfunction of right eustachian tube was  also pertinent to this visit. She tells me she can't take pred because it makes her "crazy."  Will have her start her symbicort and schedule her albuterol.  Staring zyrtec-D for ETD.     The patient is advised to call or return to clinic if she does not see an improvement in symptoms, or to seek the care of the closest emergency department if she worsens with the  above plan.   Philis Fendt, MHS, PA-C 04/21/2017 6:10 PM    Tereasa Coop, PA-C 04/21/17 1814

## 2017-04-21 NOTE — ED Triage Notes (Signed)
PT reports cough, congestion, ringing in ears, sore throat, diarrhea for 2 weeks.

## 2017-04-21 NOTE — Discharge Instructions (Signed)
Take antibiotic as prescribed.  Start your symbicort tonight and please take as prescribed. Take one puff of your rescue inhaler every 6 hours for the next 3 days and okay to take another puff every 6 as needed.  Start taking zyrtec/pseudoephedrine as prescribed to help with your ear.

## 2017-07-06 ENCOUNTER — Ambulatory Visit: Payer: Self-pay | Attending: Internal Medicine | Admitting: Family Medicine

## 2017-07-06 VITALS — BP 133/79 | HR 75 | Temp 98.3°F | Resp 16 | Ht 60.0 in | Wt 99.0 lb

## 2017-07-06 DIAGNOSIS — Z888 Allergy status to other drugs, medicaments and biological substances status: Secondary | ICD-10-CM | POA: Insufficient documentation

## 2017-07-06 DIAGNOSIS — N898 Other specified noninflammatory disorders of vagina: Secondary | ICD-10-CM

## 2017-07-06 DIAGNOSIS — Z880 Allergy status to penicillin: Secondary | ICD-10-CM | POA: Insufficient documentation

## 2017-07-06 DIAGNOSIS — J449 Chronic obstructive pulmonary disease, unspecified: Secondary | ICD-10-CM | POA: Insufficient documentation

## 2017-07-06 NOTE — Patient Instructions (Signed)

## 2017-07-06 NOTE — Progress Notes (Signed)
White discharge and burning in vaginal area Noticed intermittently since taking out IUD 5 yrs ago.

## 2017-07-06 NOTE — Progress Notes (Signed)
Subjective:  Patient ID: Cynthia Duran, female    DOB: 07/26/1965  Age: 52 y.o. MRN: [id number]  CC: Vaginal Discharge   HPI Cynthia Duran is a 52 year old female with a history of COPD here scheduled for a nurse visit initially but then complains of intermittent vaginal itching and burning for the last 5 years ever since she had her IUD removed. Discharge is whitish and malodorous and she sometimes has abdominal cramping but denies urinary symptoms and other times has shooting pains in vagina. Has not been sexually active in years. Had a pelvic exam and PAP smear by GYN in 03/2017 which came back normal.  Past Medical History:  Diagnosis Date  . Arthritis Dx 2006  . Frequent UTI   . Knee joint disorder   . Pneumonia   . UTI (lower urinary tract infection)   . Yeast infection     Past Surgical History:  Procedure Laterality Date  . cyst removed Right 2009    elbow, Dr. Silvio Pate   . LAPAROSCOPIC ABDOMINAL EXPLORATION      Allergies  Allergen Reactions  . Prednisone Other (See Comments)    Pt reports "homicidal thoughts"  . Penicillins Rash    Has patient had a PCN reaction causing immediate rash, facial/tongue/throat swelling, SOB or lightheadedness with hypotension: Yes Has patient had a PCN reaction causing severe rash involving mucus membranes or skin necrosis: No Has patient had a PCN reaction that required hospitalization No Has patient had a PCN reaction occurring within the last 10 years: No If all of the above answers are "NO", then may proceed with Cephalosporin use.       Outpatient Medications Prior to Visit  Medication Sig Dispense Refill  . albuterol (PROVENTIL HFA;VENTOLIN HFA) 108 (90 Base) MCG/ACT inhaler Inhale 2 puffs into the lungs every 6 (six) hours as needed for wheezing or shortness of breath. 1 Inhaler 6  . budesonide-formoterol (SYMBICORT) 160-4.5 MCG/ACT inhaler Inhale 2 puffs into the lungs 2 (two) times daily. 1 Inhaler 6   No  facility-administered medications prior to visit.     ROS Review of Systems  Constitutional: Negative for activity change, appetite change and fatigue.  HENT: Negative for congestion, sinus pressure and sore throat.   Eyes: Negative for visual disturbance.  Respiratory: Negative for cough, chest tightness, shortness of breath and wheezing.   Cardiovascular: Negative for chest pain and palpitations.  Gastrointestinal: Negative for abdominal distention, abdominal pain and constipation.  Endocrine: Negative for polydipsia.  Genitourinary: Positive for vaginal pain. Negative for dysuria and frequency.  Musculoskeletal: Negative for arthralgias and back pain.  Skin: Negative for rash.  Neurological: Negative for tremors, light-headedness and numbness.  Hematological: Does not bruise/bleed easily.  Psychiatric/Behavioral: Negative for agitation and behavioral problems.    Objective:  BP 133/79 (BP Location: Left Arm, Patient Position: Sitting, Cuff Size: Normal)   Pulse 75   Temp 98.3 F (36.8 C) (Oral)   Resp 16   Ht 5' (1.524 m)   Wt 99 lb (44.9 kg)   LMP 09/27/2007 (Exact Date)   SpO2 99%   BMI 19.33 kg/m   BP/Weight 07/06/2017 04/21/2017 67/61/9509  Systolic BP 326 712 458  Diastolic BP 79 77 78  Wt. (Lbs) 99 98 98  BMI 19.33 19.14 19.14      Physical Exam  Constitutional: She is oriented to person, place, and time. She appears well-developed and well-nourished.  Cardiovascular: Normal rate, normal heart sounds and intact distal pulses.  No  murmur heard. Pulmonary/Chest: Effort normal and breath sounds normal. She has no wheezes. She has no rales. She exhibits no tenderness.  Abdominal: Soft. Bowel sounds are normal. She exhibits no distension and no mass. There is no tenderness.  Musculoskeletal: Normal range of motion.  Neurological: She is alert and oriented to person, place, and time.  Skin: Skin is warm and dry.  Psychiatric: She has a normal mood and affect.      Assessment & Plan:   1. Vaginal discharge Advised to avoid douching Will send of vaginal cultures and treat accordingly. - Cervicovaginal ancillary only   No orders of the defined types were placed in this encounter.   Follow-up: Return in about 1 month (around 08/03/2017) for Follow-up of chronic medical conditions with PCP- Dr Wynetta Emery.   Charlott Rakes MD

## 2017-07-08 LAB — CERVICOVAGINAL ANCILLARY ONLY
BACTERIAL VAGINITIS: POSITIVE — AB
Candida vaginitis: NEGATIVE
Chlamydia: NEGATIVE
Neisseria Gonorrhea: NEGATIVE
Trichomonas: NEGATIVE

## 2017-07-09 ENCOUNTER — Telehealth: Payer: Self-pay | Admitting: Internal Medicine

## 2017-07-09 ENCOUNTER — Other Ambulatory Visit: Payer: Self-pay | Admitting: Family Medicine

## 2017-07-09 MED ORDER — METRONIDAZOLE 0.75 % VA GEL: 1.0000 | Freq: Every day | VAGINAL | 0 refills | Status: DC

## 2017-07-09 NOTE — Telephone Encounter (Signed)
Pt called to request her lab results,she was told it takes about 1 week to receive them,but if they get back sooner please follow up.

## 2017-07-10 MED FILL — VANDAZOLE VAGINAL 0.75% GEL: 0.75 | 5 days supply | Qty: 70 | Fill #0

## 2017-07-10 NOTE — Telephone Encounter (Signed)
Contacted pt and went over results and informed her of the rx that was sent to the pharmacy. Pt is requesting something other than the gel. Pt states she put herself in the hospital before on accident because she used voltran gel in vagina. Pt states she got it confused between the metrogel and voltran gel. Pt states she doesn't want the metrogel. Pt is requesting pill form for BV.

## 2017-07-11 MED ORDER — METRONIDAZOLE 500 MG PO TABS: 500.0000 mg | ORAL_TABLET | Freq: Two times a day (BID) | ORAL | 0 refills | Status: DC

## 2017-07-12 IMAGING — DX DG CHEST 2V
2 series · 2 of 2 positions shown · non-contrast
Comparison: 02/26/2014

CLINICAL DATA: Severe COPD, increased coughing and congestion,
low-grade fever

EXAM:
CHEST  2 VIEW

[chest pa]
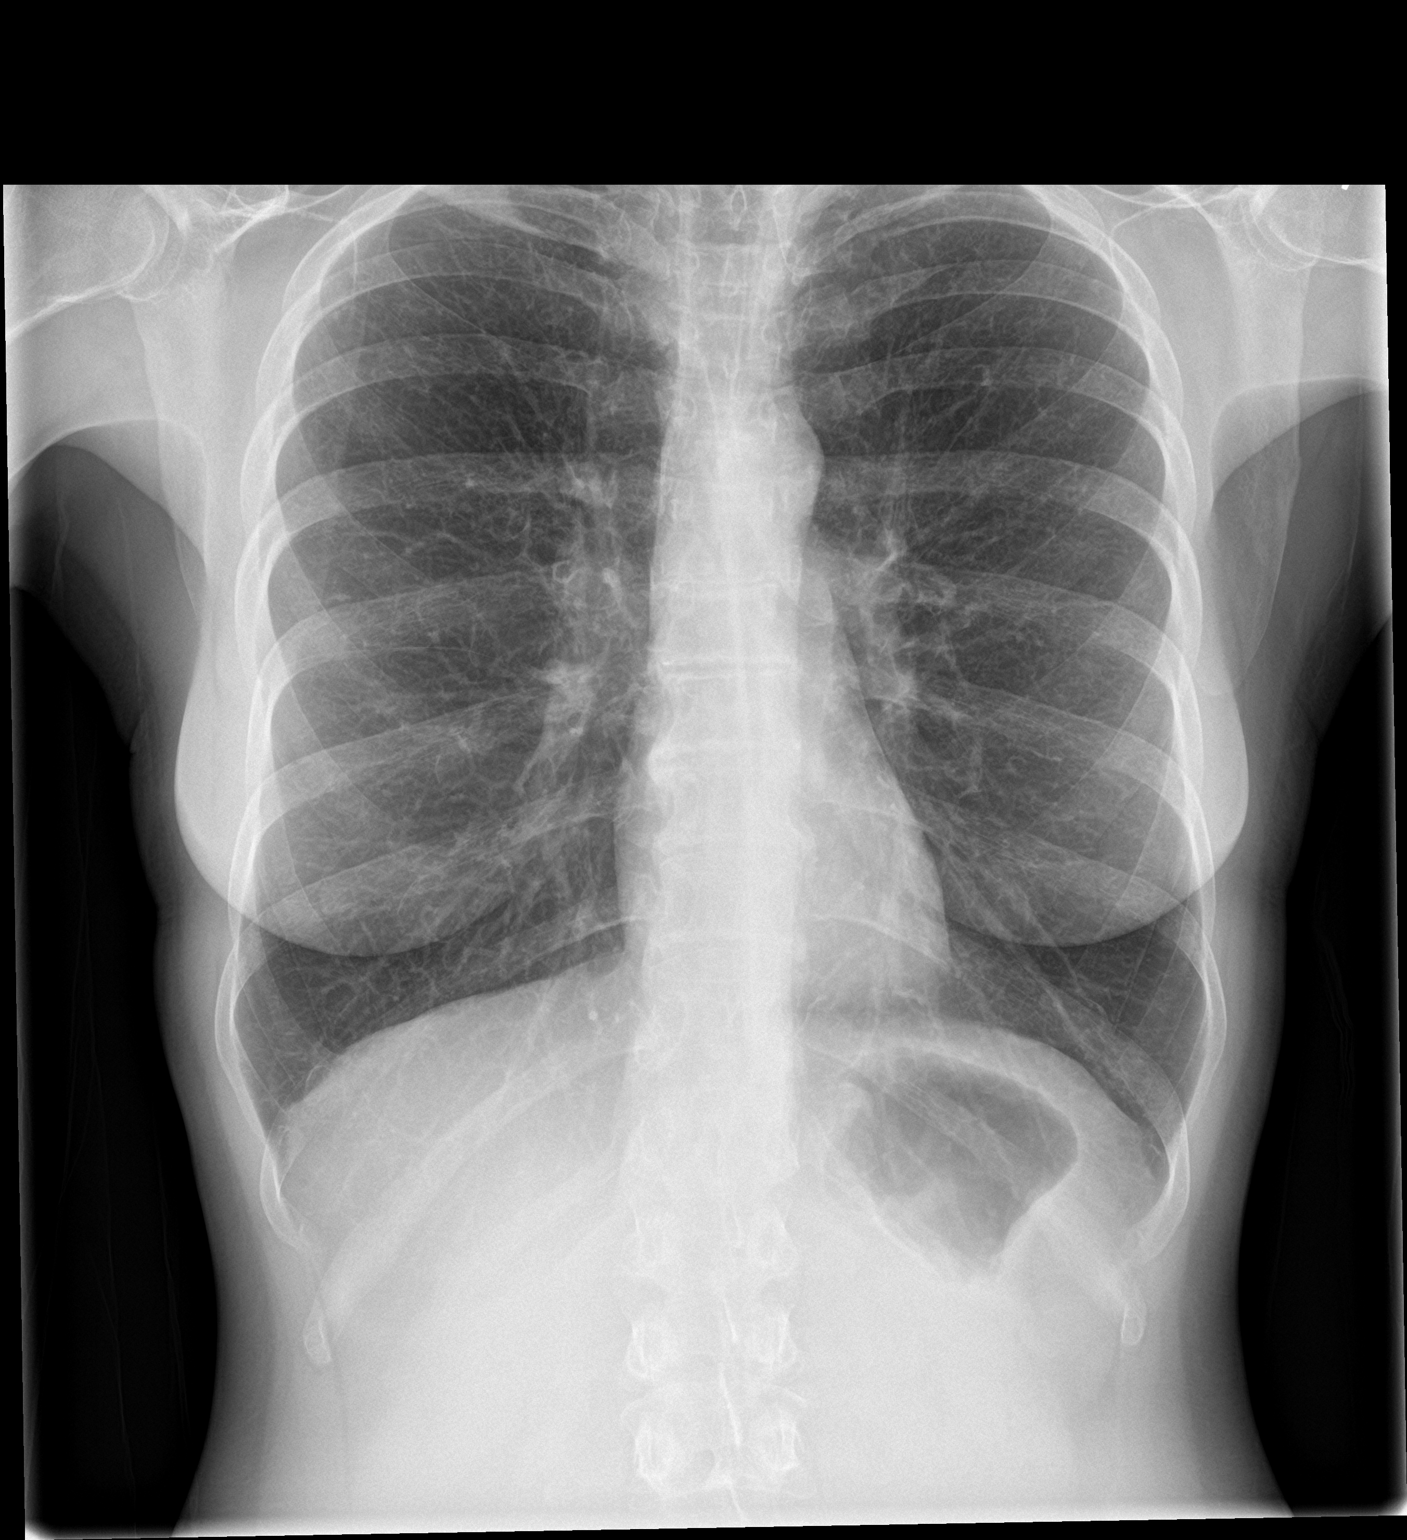

[chest lat]
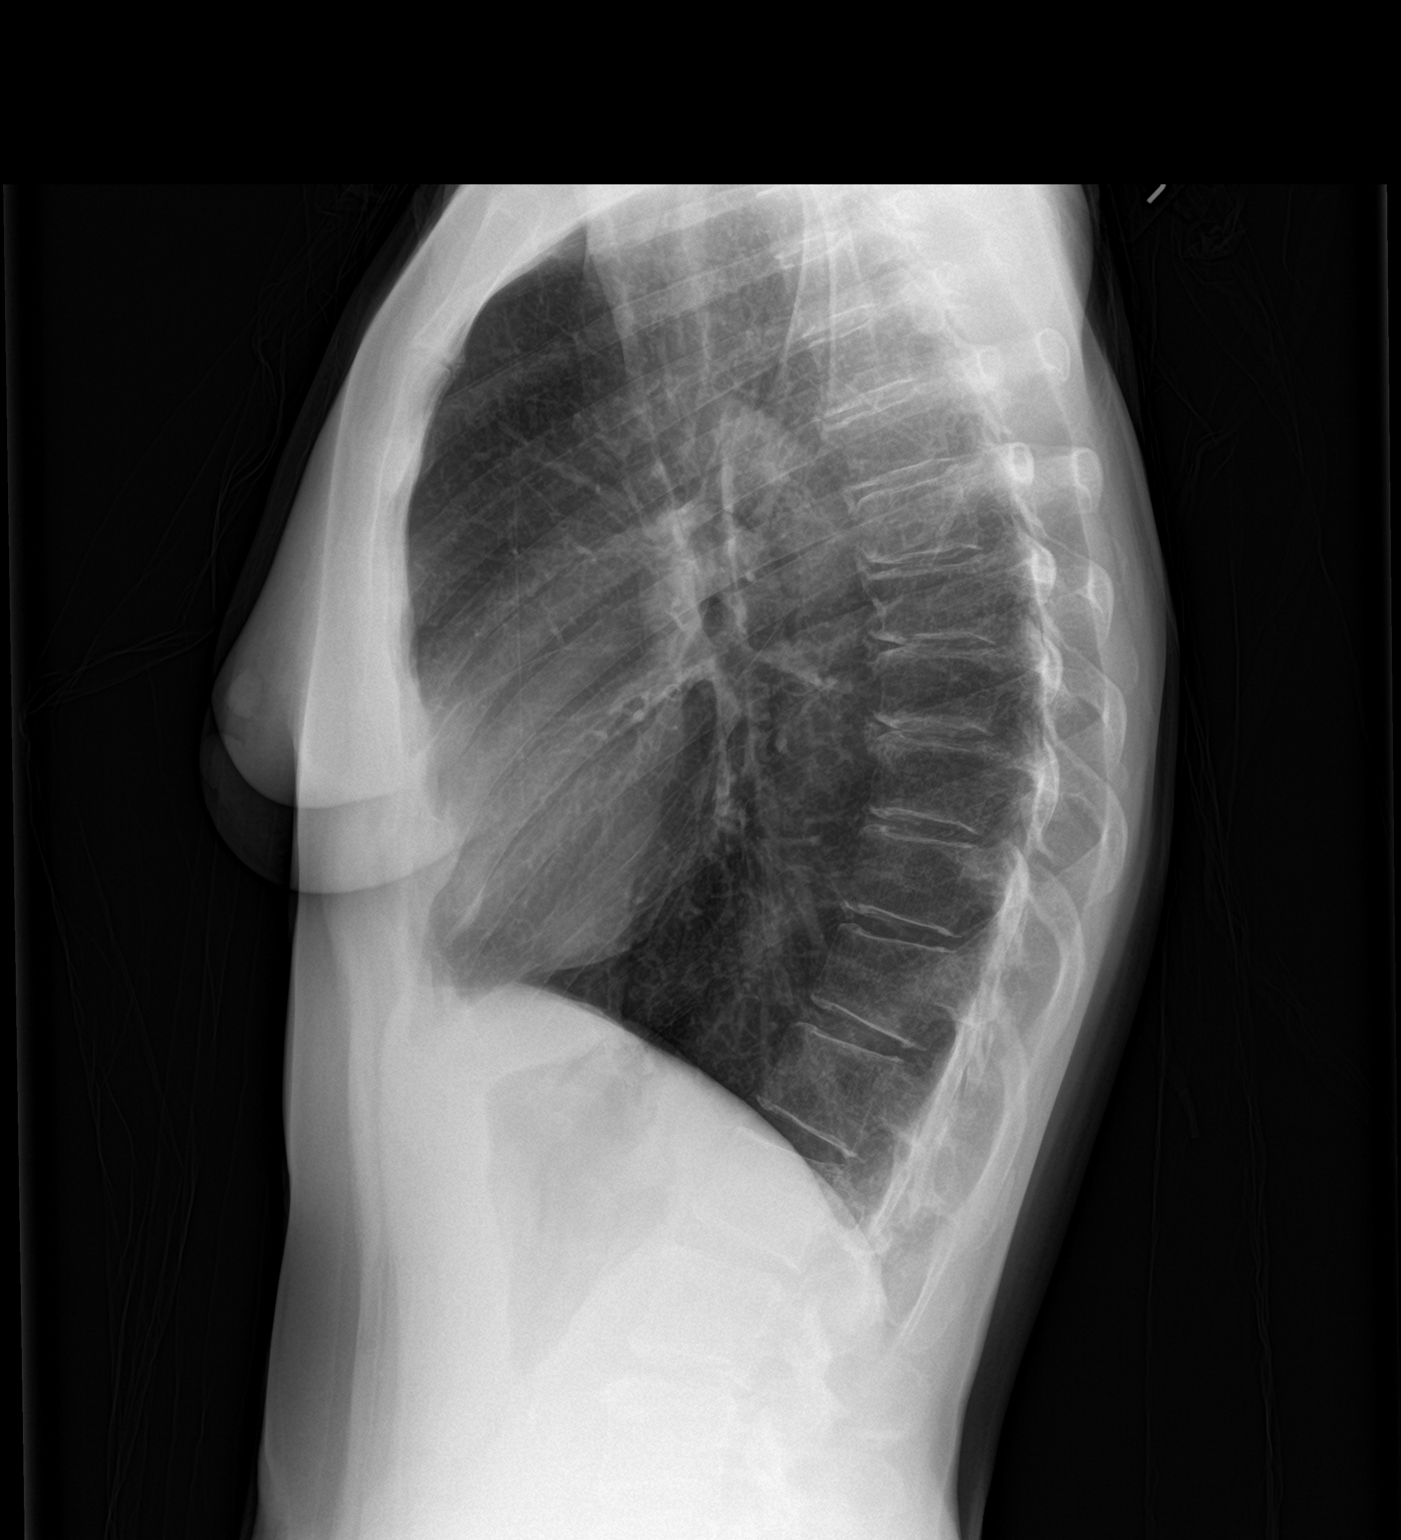

[2 of 2 positions shown; findings below may reference images not displayed]

FINDINGS: Stable hyperinflation without focal pneumonia, collapse or
consolidation. Normal heart size and vascularity. Negative for
edema, effusion or pneumothorax. Trachea is midline. Minor mid
thoracic spondylosis.
IMPRESSION: Stable exam.  No superimposed acute process.

## 2017-07-13 MED FILL — metroNIDAZOLE 500 MG TABS: 500 | 7 days supply | Qty: 14 | Fill #0

## 2017-08-10 ENCOUNTER — Ambulatory Visit: Payer: Self-pay | Attending: Internal Medicine | Admitting: Internal Medicine

## 2017-08-10 ENCOUNTER — Encounter: Payer: Self-pay | Admitting: Internal Medicine

## 2017-08-10 VITALS — BP 118/75 | HR 67 | Temp 98.5°F | Resp 16 | Wt 100.6 lb

## 2017-08-10 DIAGNOSIS — Z888 Allergy status to other drugs, medicaments and biological substances status: Secondary | ICD-10-CM | POA: Insufficient documentation

## 2017-08-10 DIAGNOSIS — J449 Chronic obstructive pulmonary disease, unspecified: Secondary | ICD-10-CM | POA: Insufficient documentation

## 2017-08-10 DIAGNOSIS — N898 Other specified noninflammatory disorders of vagina: Secondary | ICD-10-CM | POA: Insufficient documentation

## 2017-08-10 DIAGNOSIS — Z880 Allergy status to penicillin: Secondary | ICD-10-CM | POA: Insufficient documentation

## 2017-08-10 DIAGNOSIS — F17210 Nicotine dependence, cigarettes, uncomplicated: Secondary | ICD-10-CM | POA: Insufficient documentation

## 2017-08-10 DIAGNOSIS — Z79899 Other long term (current) drug therapy: Secondary | ICD-10-CM | POA: Insufficient documentation

## 2017-08-10 DIAGNOSIS — R1030 Lower abdominal pain, unspecified: Secondary | ICD-10-CM | POA: Insufficient documentation

## 2017-08-10 DIAGNOSIS — R1013 Epigastric pain: Secondary | ICD-10-CM | POA: Insufficient documentation

## 2017-08-10 NOTE — Progress Notes (Signed)
Patient ID: Cynthia Duran, female    DOB: 17-Dec-1965  MRN: [id number]  CC: Follow-up and Vaginal Discharge   Subjective: Cynthia Duran is a 52 y.o. female who presents for chronic ds management. Her concerns today include:  hx of COPD, tob  1.  Recurrent BV:  treated for same x 2 in the past several months.  Last seen for same in February of this year.  Chlamydia and gonorrhea cultures negative. -Today she complains of the same persistent white dischg -not sexually active in past 3 yrs.  2. Had c-scope. 1 non-cancerous polyp removed.  Repeat needed in 10 yrs  3.  Pain across lower abdomen all the time.  This was the reason she had seen gastroenterologist for colonoscopy..  Reports + nausea, bloating and gas with meals x yrs.  No particular foods cause symptoms.  CT of the abdomen done last year revealed no gallstones Treated for H.pylori yrs ago.  She feels symptoms are similar and wonders whether she has a repeat infection.  She is not on any NSAIDs Patient Active Problem List   Diagnosis Date Noted  . Abdominal bloating 03/20/2017  . Family history of early CAD 04/02/2016  . Hypersomnia 09/14/2015  . COPD (chronic obstructive pulmonary disease) ([place]) 09/13/2015  . Chronic fatigue 05/24/2015  . Benign mole 05/24/2015  . Osteoarthritis 09/19/2014  . Smoker 03/27/2014  . Low back pain 03/27/2014  . Early menopause 03/27/2014     Current Outpatient Medications on File Prior to Visit  Medication Sig Dispense Refill  . albuterol (PROVENTIL HFA;VENTOLIN HFA) 108 (90 Base) MCG/ACT inhaler Inhale 2 puffs into the lungs every 6 (six) hours as needed for wheezing or shortness of breath. 1 Inhaler 6  . budesonide-formoterol (SYMBICORT) 160-4.5 MCG/ACT inhaler Inhale 2 puffs into the lungs 2 (two) times daily. 1 Inhaler 6  . metroNIDAZOLE (FLAGYL) 500 MG tablet Take 1 tablet (500 mg total) by mouth 2 (two) times daily. (Patient not taking: Reported on 08/10/2017) 14 tablet 0   No  current facility-administered medications on file prior to visit.     Allergies  Allergen Reactions  . Prednisone Other (See Comments)    Pt reports "homicidal thoughts"  . Penicillins Rash    Has patient had a PCN reaction causing immediate rash, facial/tongue/throat swelling, SOB or lightheadedness with hypotension: Yes Has patient had a PCN reaction causing severe rash involving mucus membranes or skin necrosis: No Has patient had a PCN reaction that required hospitalization No Has patient had a PCN reaction occurring within the last 10 years: No If all of the above answers are "NO", then may proceed with Cephalosporin use.     Social History   Socioeconomic History  . Marital status: Single    Spouse name: Not on file  . Number of children: 3   . Years of education: GED   . Highest education level: Not on file  Occupational History  . Occupation: Unemployed   Social Needs  . Financial resource strain: Not on file  . Food insecurity:    Worry: Not on file    Inability: Not on file  . Transportation needs:    Medical: Not on file    Non-medical: Not on file  Tobacco Use  . Smoking status: Current Every Day Smoker    Packs/day: 1.50    Years: 30.00    Pack years: 45.00  . Smokeless tobacco: Never Used  Substance and Sexual Activity  . Alcohol use: Yes  Comment: socially   . Drug use: No  . Sexual activity: Not Currently    Birth control/protection: Post-menopausal  Lifestyle  . Physical activity:    Days per week: Not on file    Minutes per session: Not on file  . Stress: Not on file  Relationships  . Social connections:    Talks on phone: Not on file    Gets together: Not on file    Attends religious service: Not on file    Active member of club or organization: Not on file    Attends meetings of clubs or organizations: Not on file    Relationship status: Not on file  . Intimate partner violence:    Fear of current or ex partner: Not on file     Emotionally abused: Not on file    Physically abused: Not on file    Forced sexual activity: Not on file  Other Topics Concern  . Not on file  Social History Narrative   3 children-   Age 41, 88, 64.       Has 19, 85 yo grandchild.    Mother passed away in Sep 21, 2013.        Family History  Problem Relation Age of Onset  . Arthritis Mother        Rhematoid  . COPD Mother   . Heart disease Father   . Hypertension Father   . Diabetes Maternal Grandmother   . Thyroid disease Maternal Grandmother   . Cancer Maternal Aunt        breast   . Colon cancer Neg Hx     Past Surgical History:  Procedure Laterality Date  . cyst removed Right 2009    elbow, Dr. Silvio Pate   . LAPAROSCOPIC ABDOMINAL EXPLORATION      ROS: Review of Systems Negative except as above PHYSICAL EXAM: BP 118/75   Pulse 67   Temp 98.5 F (36.9 C) (Oral)   Resp 16   Wt 100 lb 9.6 oz (45.6 kg)   LMP 09/27/2007 (Exact Date)   SpO2 98%   BMI 19.65 kg/m   Physical Exam  General appearance - alert, well appearing, and in no distress Mental status - alert, oriented to person, place, and time, normal mood, behavior, speech, dress, motor activity, and thought processes Abdomen - soft, nontender, nondistended, no masses or organomegaly Pelvic - normal external genitalia, vulva.  Small to moderate amount of white discharge around the cervix.  Cervix is grossly normal.    ASSESSMENT AND PLAN: 1. Dyspepsia We will check for H. pylori and if positive will treat accordingly - H. pylori breath test  2. Vaginal discharge -Recheck for BV.  If positive we will treat for a longer period with Flagyl or consider clindamycin - Cervicovaginal ancillary only  Patient was given the opportunity to ask questions.  Patient verbalized understanding of the plan and was able to repeat key elements of the plan.   Orders Placed This Encounter  Procedures  . H. pylori breath test     Requested Prescriptions    No  prescriptions requested or ordered in this encounter    Return if symptoms worsen or fail to improve.  Karle Plumber, MD, FACP

## 2017-08-11 ENCOUNTER — Other Ambulatory Visit: Payer: Self-pay | Admitting: Internal Medicine

## 2017-08-11 LAB — CERVICOVAGINAL ANCILLARY ONLY
BACTERIAL VAGINITIS: POSITIVE — AB
CANDIDA VAGINITIS: NEGATIVE

## 2017-08-11 MED ORDER — METRONIDAZOLE 500 MG PO TABS: 500.0000 mg | ORAL_TABLET | Freq: Two times a day (BID) | ORAL | 0 refills | Status: DC

## 2017-08-11 MED ORDER — METRONIDAZOLE 0.75 % VA GEL: VAGINAL | 1 refills | Status: DC

## 2017-08-12 ENCOUNTER — Other Ambulatory Visit: Payer: Self-pay | Admitting: Internal Medicine

## 2017-08-12 ENCOUNTER — Encounter: Payer: Self-pay | Admitting: Internal Medicine

## 2017-08-12 LAB — H. PYLORI BREATH TEST: H PYLORI BREATH TEST: NEGATIVE

## 2017-08-12 MED ORDER — OMEPRAZOLE 20 MG PO CPDR: 20.0000 mg | DELAYED_RELEASE_CAPSULE | Freq: Every day | ORAL | 3 refills | Status: DC

## 2017-08-12 MED FILL — VANDAZOLE VAGINAL 0.75% GEL: 0.75 | 21 days supply | Qty: 70 | Fill #0

## 2017-08-12 MED FILL — metroNIDAZOLE 500 MG TABS: 500 | 7 days supply | Qty: 14 | Fill #0

## 2017-08-12 MED FILL — OMEPRAZOLE 20 MG CAP: 20 | 30 days supply | Qty: 30 | Fill #0

## 2017-08-12 NOTE — Progress Notes (Signed)
The following message was sent to pt via Mychart: Your breath test for H.pylori bacteria is negative.  Given your symptoms, I suggest a trail of Omeprazole (Prilosec) once a day.  Prescription sent to our pharmacy for pick up.  If you improvement after 6-8 weeks, we can refer to the gastroenterologist.

## 2017-09-21 ENCOUNTER — Encounter: Payer: Self-pay | Admitting: Family Medicine

## 2017-09-21 ENCOUNTER — Ambulatory Visit: Payer: Self-pay | Attending: Internal Medicine | Admitting: Family Medicine

## 2017-09-21 VITALS — BP 117/74 | HR 72 | Temp 98.1°F | Ht 60.0 in | Wt 99.0 lb

## 2017-09-21 DIAGNOSIS — Z87440 Personal history of urinary (tract) infections: Secondary | ICD-10-CM | POA: Insufficient documentation

## 2017-09-21 DIAGNOSIS — Z9889 Other specified postprocedural states: Secondary | ICD-10-CM | POA: Insufficient documentation

## 2017-09-21 DIAGNOSIS — Z880 Allergy status to penicillin: Secondary | ICD-10-CM | POA: Insufficient documentation

## 2017-09-21 DIAGNOSIS — R110 Nausea: Secondary | ICD-10-CM | POA: Insufficient documentation

## 2017-09-21 DIAGNOSIS — Z79899 Other long term (current) drug therapy: Secondary | ICD-10-CM | POA: Insufficient documentation

## 2017-09-21 DIAGNOSIS — Z888 Allergy status to other drugs, medicaments and biological substances status: Secondary | ICD-10-CM | POA: Insufficient documentation

## 2017-09-21 DIAGNOSIS — R300 Dysuria: Secondary | ICD-10-CM | POA: Insufficient documentation

## 2017-09-21 DIAGNOSIS — J449 Chronic obstructive pulmonary disease, unspecified: Secondary | ICD-10-CM | POA: Insufficient documentation

## 2017-09-21 DIAGNOSIS — M1990 Unspecified osteoarthritis, unspecified site: Secondary | ICD-10-CM | POA: Insufficient documentation

## 2017-09-21 DIAGNOSIS — R197 Diarrhea, unspecified: Secondary | ICD-10-CM | POA: Insufficient documentation

## 2017-09-21 LAB — POCT URINALYSIS DIPSTICK
Bilirubin, UA: NEGATIVE
Blood, UA: NEGATIVE
Glucose, UA: NEGATIVE
KETONES UA: NEGATIVE
LEUKOCYTES UA: NEGATIVE
NITRITE UA: NEGATIVE
PH UA: 7 (ref 5.0–8.0)
PROTEIN UA: NEGATIVE
Spec Grav, UA: <=1.005 — AB (ref 1.010–1.025)
UROBILINOGEN UA: 0.2 U/dL

## 2017-09-21 NOTE — Progress Notes (Signed)
Subjective:  Patient ID: Cynthia Duran, female    DOB: [birth year]  Age: 52 y.o. MRN: [id number]  CC: Urinary Tract Infection   HPI Cynthia Duran is a 52 year old female with a history of COPD, osteoarthritis who presents today for an acute visit complaining of a 4-day history of pelvic pain, urinary frequency, vaginal spotting. She informs me she has chronic BV and her symptoms started ever since she had an IUD inserted which has not been removed. States her last period was in 2008 and she endorses some nausea and diarrhea but no flank pain or fever she has not taken any over-the-counter medications Urinalysis in the clinic is negative for nitrites, leukocyte esterase.  Past Medical History:  Diagnosis Date  . Arthritis Dx 2006  . Frequent UTI   . Knee joint disorder   . Pneumonia   . UTI (lower urinary tract infection)   . Yeast infection     Past Surgical History:  Procedure Laterality Date  . cyst removed Right 2009    elbow, Dr. Silvio Pate   . LAPAROSCOPIC ABDOMINAL EXPLORATION      Allergies  Allergen Reactions  . Prednisone Other (See Comments)    Pt reports "homicidal thoughts"  . Penicillins Rash    Has patient had a PCN reaction causing immediate rash, facial/tongue/throat swelling, SOB or lightheadedness with hypotension: Yes Has patient had a PCN reaction causing severe rash involving mucus membranes or skin necrosis: No Has patient had a PCN reaction that required hospitalization No Has patient had a PCN reaction occurring within the last 10 years: No If all of the above answers are "NO", then may proceed with Cephalosporin use.       Outpatient Medications Prior to Visit  Medication Sig Dispense Refill  . albuterol (PROVENTIL HFA;VENTOLIN HFA) 108 (90 Base) MCG/ACT inhaler Inhale 2 puffs into the lungs every 6 (six) hours as needed for wheezing or shortness of breath. 1 Inhaler 6  . budesonide-formoterol (SYMBICORT) 160-4.5 MCG/ACT inhaler Inhale 2  puffs into the lungs 2 (two) times daily. 1 Inhaler 6  . omeprazole (PRILOSEC) 20 MG capsule Take 1 capsule (20 mg total) by mouth daily. 30 capsule 3  . metroNIDAZOLE (FLAGYL) 500 MG tablet Take 1 tablet (500 mg total) by mouth 2 (two) times daily. (Patient not taking: Reported on 09/21/2017) 14 tablet 0  . metroNIDAZOLE (METROGEL VAGINAL) 0.75 % vaginal gel 1 applicator full intravaginally twice a wk. (Patient not taking: Reported on 09/21/2017) 70 g 1   No facility-administered medications prior to visit.     ROS Review of Systems  Constitutional: Negative for activity change, appetite change and fatigue.  HENT: Negative for congestion, sinus pressure and sore throat.   Eyes: Negative for visual disturbance.  Respiratory: Negative for cough, chest tightness, shortness of breath and wheezing.   Cardiovascular: Negative for chest pain and palpitations.  Gastrointestinal: Negative for abdominal distention, abdominal pain and constipation.  Endocrine: Negative for polydipsia.  Genitourinary: Positive for pelvic pain. Negative for dysuria and frequency.  Musculoskeletal: Negative for arthralgias and back pain.  Skin: Negative for rash.  Neurological: Negative for tremors, light-headedness and numbness.  Hematological: Does not bruise/bleed easily.  Psychiatric/Behavioral: Negative for agitation and behavioral problems.    Objective:  BP 117/74   Pulse 72   Temp 98.1 F (36.7 C) (Oral)   Ht 5' (1.524 m)   Wt 99 lb (44.9 kg)   LMP 09/27/2007 (Exact Date)   SpO2 99%   BMI 19.33 kg/m  BP/Weight 09/21/2017 08/10/2017 8/67/6720  Systolic BP 947 096 283  Diastolic BP 74 75 79  Wt. (Lbs) 99 100.6 99  BMI 19.33 19.65 19.33      Physical Exam  Constitutional: She is oriented to person, place, and time. She appears well-developed and well-nourished.  Cardiovascular: Normal rate, normal heart sounds and intact distal pulses.  No murmur heard. Pulmonary/Chest: Effort normal and breath  sounds normal. She has no wheezes. She has no rales. She exhibits no tenderness.  Abdominal: Soft. Bowel sounds are normal. She exhibits no distension and no mass. There is no tenderness.  Musculoskeletal: Normal range of motion.  No flank pain  Neurological: She is alert and oriented to person, place, and time.   Urinalysis    Component Value Date/Time   COLORURINE STRAW (A) 10/05/2016 0512   APPEARANCEUR CLEAR 10/05/2016 0512   LABSPEC 1.003 (L) 10/05/2016 0512   PHURINE 7.0 10/05/2016 0512   GLUCOSEU NEGATIVE 10/05/2016 0512   HGBUR SMALL (A) 10/05/2016 0512   BILIRUBINUR negative 09/21/2017 1017   KETONESUR NEGATIVE 10/05/2016 0512   PROTEINUR negative 09/21/2017 1017   PROTEINUR NEGATIVE 10/05/2016 0512   UROBILINOGEN 0.2 09/21/2017 1017   UROBILINOGEN 0.2 02/27/2014 1846   NITRITE negative 09/21/2017 1017   NITRITE NEGATIVE 10/05/2016 0512   LEUKOCYTESUR Negative 09/21/2017 1017       Assessment & Plan:   1. Dysuria Analysis not in keeping with UTI at this time I encouraged to increase fluid intake, cranberry juice We will send out labs for urine culture, urine ancillary test - POCT urinalysis dipstick - Urine cytology ancillary only - Urine Culture   No orders of the defined types were placed in this encounter.   Follow-up: Return for For follow-up of chronic medical conditions, keep previously scheduled appointment.   Charlott Rakes MD

## 2017-09-22 LAB — URINALYSIS
Bilirubin, UA: NEGATIVE
Glucose, UA: NEGATIVE
Ketones, UA: NEGATIVE
LEUKOCYTES UA: NEGATIVE
NITRITE UA: NEGATIVE
Protein, UA: NEGATIVE
RBC UA: NEGATIVE
Specific Gravity, UA: 1.005 (ref 1.005–1.030)
Urobilinogen, Ur: 0.2 mg/dL (ref 0.2–1.0)
pH, UA: 6.5 (ref 5.0–7.5)

## 2017-09-22 LAB — URINE CYTOLOGY ANCILLARY ONLY
Bacterial vaginitis: NEGATIVE
CANDIDA VAGINITIS: NEGATIVE
Chlamydia: NEGATIVE
Neisseria Gonorrhea: NEGATIVE
Trichomonas: NEGATIVE

## 2017-09-23 LAB — URINE CULTURE

## 2017-09-28 ENCOUNTER — Telehealth: Payer: Self-pay | Admitting: Internal Medicine

## 2017-09-28 NOTE — Telephone Encounter (Signed)
Patient called wanting to go over labs from last visit and would like to get a referral. Please fu at  [phone]

## 2017-09-30 NOTE — Telephone Encounter (Signed)
Patient called requesting to speak with her nurse regarding her lab results from her last OV. Patient stated this was her second time calling. Please f/u with patient.

## 2017-10-01 NOTE — Telephone Encounter (Signed)
Returned pt call and informed her that she reviewed her results on my chart

## 2017-12-21 ENCOUNTER — Ambulatory Visit: Payer: Self-pay | Admitting: Internal Medicine

## 2018-01-22 ENCOUNTER — Ambulatory Visit: Payer: Self-pay | Attending: Internal Medicine | Admitting: Internal Medicine

## 2018-01-22 ENCOUNTER — Encounter

## 2018-01-22 VITALS — BP 124/77 | HR 64 | Temp 98.1°F | Ht 60.0 in | Wt 97.4 lb

## 2018-01-22 DIAGNOSIS — N898 Other specified noninflammatory disorders of vagina: Secondary | ICD-10-CM | POA: Insufficient documentation

## 2018-01-22 DIAGNOSIS — Z833 Family history of diabetes mellitus: Secondary | ICD-10-CM | POA: Insufficient documentation

## 2018-01-22 DIAGNOSIS — Z880 Allergy status to penicillin: Secondary | ICD-10-CM | POA: Insufficient documentation

## 2018-01-22 DIAGNOSIS — R102 Pelvic and perineal pain: Secondary | ICD-10-CM | POA: Insufficient documentation

## 2018-01-22 DIAGNOSIS — R143 Flatulence: Secondary | ICD-10-CM | POA: Insufficient documentation

## 2018-01-22 DIAGNOSIS — Z8249 Family history of ischemic heart disease and other diseases of the circulatory system: Secondary | ICD-10-CM | POA: Insufficient documentation

## 2018-01-22 DIAGNOSIS — Z803 Family history of malignant neoplasm of breast: Secondary | ICD-10-CM | POA: Insufficient documentation

## 2018-01-22 DIAGNOSIS — Z888 Allergy status to other drugs, medicaments and biological substances status: Secondary | ICD-10-CM | POA: Insufficient documentation

## 2018-01-22 DIAGNOSIS — F17210 Nicotine dependence, cigarettes, uncomplicated: Secondary | ICD-10-CM | POA: Insufficient documentation

## 2018-01-22 DIAGNOSIS — Z79899 Other long term (current) drug therapy: Secondary | ICD-10-CM | POA: Insufficient documentation

## 2018-01-22 NOTE — Patient Instructions (Signed)
Please sign up for orange card and cone discount.  I will call you with your results next week.

## 2018-01-22 NOTE — Progress Notes (Signed)
Patient ID: Cynthia Duran, female    DOB: [birth year]  MRN: [id number]  CC: Vaginal Pain   Subjective: Cynthia Duran is a 52 y.o. female who presents for UC Her concerns today include:   Patient complains of having possible recurrent BV.  This is been an ongoing issue for her.  Reports white vaginal discharge and that the pantiliner is always wet.  She has not been sexually active in several years.  She also complains of having a "deep hard pain in my vagina" x 4 mths.  She feels like her vagina may be falling out.  Does not recall any strenuous activity around the time that this started.  Her next concern is that she passes very malodorous flatulence and she is wondering if something is wrong.  Denies any bloating.  She does not really associated with any particular types of foods.  She had colonoscopy last year.   Patient Active Problem List   Diagnosis Date Noted  . Abdominal bloating 03/20/2017  . Family history of early CAD 04/02/2016  . Hypersomnia 09/14/2015  . COPD (chronic obstructive pulmonary disease) ([place]) 09/13/2015  . Chronic fatigue 05/24/2015  . Benign mole 05/24/2015  . Osteoarthritis 09/19/2014  . Smoker 03/27/2014  . Low back pain 03/27/2014  . Early menopause 03/27/2014     Current Outpatient Medications on File Prior to Visit  Medication Sig Dispense Refill  . albuterol (PROVENTIL HFA;VENTOLIN HFA) 108 (90 Base) MCG/ACT inhaler Inhale 2 puffs into the lungs every 6 (six) hours as needed for wheezing or shortness of breath. 1 Inhaler 6  . budesonide-formoterol (SYMBICORT) 160-4.5 MCG/ACT inhaler Inhale 2 puffs into the lungs 2 (two) times daily. 1 Inhaler 6  . omeprazole (PRILOSEC) 20 MG capsule Take 1 capsule (20 mg total) by mouth daily. 30 capsule 3  . metroNIDAZOLE (FLAGYL) 500 MG tablet Take 1 tablet (500 mg total) by mouth 2 (two) times daily. (Patient not taking: Reported on 09/21/2017) 14 tablet 0  . metroNIDAZOLE (METROGEL VAGINAL) 0.75 % vaginal  gel 1 applicator full intravaginally twice a wk. (Patient not taking: Reported on 09/21/2017) 70 g 1   No current facility-administered medications on file prior to visit.     Allergies  Allergen Reactions  . Prednisone Other (See Comments)    Pt reports "homicidal thoughts"  . Penicillins Rash    Has patient had a PCN reaction causing immediate rash, facial/tongue/throat swelling, SOB or lightheadedness with hypotension: Yes Has patient had a PCN reaction causing severe rash involving mucus membranes or skin necrosis: No Has patient had a PCN reaction that required hospitalization No Has patient had a PCN reaction occurring within the last 10 years: No If all of the above answers are "NO", then may proceed with Cephalosporin use.     Social History   Socioeconomic History  . Marital status: Single    Spouse name: Not on file  . Number of children: 3   . Years of education: GED   . Highest education level: Not on file  Occupational History  . Occupation: Unemployed   Social Needs  . Financial resource strain: Not on file  . Food insecurity:    Worry: Not on file    Inability: Not on file  . Transportation needs:    Medical: Not on file    Non-medical: Not on file  Tobacco Use  . Smoking status: Current Every Day Smoker    Packs/day: 1.50    Years: 30.00    Pack  years: 45.00  . Smokeless tobacco: Never Used  Substance and Sexual Activity  . Alcohol use: Yes    Comment: socially   . Drug use: No  . Sexual activity: Not Currently    Birth control/protection: Post-menopausal  Lifestyle  . Physical activity:    Days per week: Not on file    Minutes per session: Not on file  . Stress: Not on file  Relationships  . Social connections:    Talks on phone: Not on file    Gets together: Not on file    Attends religious service: Not on file    Active member of club or organization: Not on file    Attends meetings of clubs or organizations: Not on file    Relationship  status: Not on file  . Intimate partner violence:    Fear of current or ex partner: Not on file    Emotionally abused: Not on file    Physically abused: Not on file    Forced sexual activity: Not on file  Other Topics Concern  . Not on file  Social History Narrative   3 children-   Age 25, 39, 58.       Has 53, 64 yo grandchild.    Mother passed away in 09/16/13.        Family History  Problem Relation Age of Onset  . Arthritis Mother        Rhematoid  . COPD Mother   . Heart disease Father   . Hypertension Father   . Diabetes Maternal Grandmother   . Thyroid disease Maternal Grandmother   . Cancer Maternal Aunt        breast   . Colon cancer Neg Hx     Past Surgical History:  Procedure Laterality Date  . cyst removed Right 2009    elbow, Dr. Silvio Pate   . LAPAROSCOPIC ABDOMINAL EXPLORATION      ROS: Review of Systems Negative except as above. PHYSICAL EXAM: BP 124/77   Pulse 64   Temp 98.1 F (36.7 C) (Oral)   Ht 5' (1.524 m)   Wt 97 lb 6.4 oz (44.2 kg)   LMP 09/27/2007 (Exact Date)   SpO2 98%   BMI 19.02 kg/m   Wt Readings from Last 3 Encounters:  01/22/18 97 lb 6.4 oz (44.2 kg)  09/21/17 99 lb (44.9 kg)  08/10/17 100 lb 9.6 oz (45.6 kg)    Physical Exam  General appearance - alert, well appearing, and in no distress Mental status - normal mood, behavior, speech, dress, motor activity, and thought processes Pelvic -CMA Magdalen Spatz present.  Normal external genitalia, vulva, vagina, cervix, uterus and adnexa.  No vaginal or uterine prolapse noted.  ASSESSMENT AND PLAN: 1. Vaginal discharge 2. Pelvic pain -We will check urine for BV.  If present we will refer to GYN as this has been a chronic ongoing issue for her that has not really responded to Flagyl or prolonged course of Clinda.  Pelvic exam negative for vaginal or uterine prolapse.  Patient will apply for the orange card and we will refer to GYN once she is approved. - Urine cytology ancillary  only  3. Flatulence symptom Reassurance given  Patient was given the opportunity to ask questions.  Patient verbalized understanding of the plan and was able to repeat key elements of the plan.   No orders of the defined types were placed in this encounter.    Requested Prescriptions    No prescriptions requested or  ordered in this encounter    No follow-ups on file.  Karle Plumber, MD, FACP

## 2018-01-28 ENCOUNTER — Other Ambulatory Visit: Payer: Self-pay | Admitting: Internal Medicine

## 2018-01-28 LAB — URINE CYTOLOGY ANCILLARY ONLY
BACTERIAL VAGINITIS: POSITIVE — AB
CANDIDA VAGINITIS: NEGATIVE

## 2018-01-28 MED ORDER — METRONIDAZOLE 500 MG PO TABS: 500.0000 mg | ORAL_TABLET | Freq: Two times a day (BID) | ORAL | 0 refills | Status: DC

## 2018-01-28 MED FILL — metroNIDAZOLE 500 MG TABS: 500 | 7 days supply | Qty: 14 | Fill #0

## 2018-02-05 ENCOUNTER — Ambulatory Visit: Payer: Self-pay

## 2018-08-11 ENCOUNTER — Telehealth: Payer: Self-pay | Admitting: Internal Medicine

## 2018-08-11 NOTE — Telephone Encounter (Signed)
-----   Message from Forde Dandy, PharmD sent at 08/11/2018 10:40 AM EDT ----- Regarding: Patient question Hi Dr. Wynetta Emery Sorry to message you during a chaotic time. We have been working on an initiative to de-escalate inhaled corticosteroids for COPD patients. This patient qualifies for our initiative due to the following:  No exacerbations in the past year (last exacerbation December 2018) Last eosinophil count < 300 cells/microL in 2018 No known asthma overlap (FEV1 change post-bronchodilator: 1% seen on PFT in 2017)  If you approve, we can facilitate changing therapy from Symbicort to a long-acting muscarinic antagonist and ensure patient can access the medication affordably. We can also contact patient for baseline symptom assessment and follow up within 1 month and 3 months (re-initiate inhaled corticosteroids if symptoms arise or worsen). If appropriate, we can also help with smoking cessation and immunizations. We will route all notes to you.  Please let me know---we sincerely appreciate your collaboration.  Thank you,  Mannie Stabile Clinical Pharmacist

## 2018-08-11 NOTE — Telephone Encounter (Signed)
I gave the okay for this.

## 2018-08-17 ENCOUNTER — Telehealth: Payer: Self-pay | Admitting: Pharmacist

## 2018-08-17 DIAGNOSIS — J449 Chronic obstructive pulmonary disease, unspecified: Secondary | ICD-10-CM

## 2018-08-17 NOTE — Progress Notes (Addendum)
Cynthia Duran is a 53 y.o. female was contacted for de-escalation of inhaled corticosteroid on behalf of Dr. Wynetta Emery.  Patient's current COPD medication regimen consists of: Symbicort and PRN albuterol. Patient denies COPD related symptoms. Patient denies use of rescue inhaler with an estimated use of < 1 times per week. Patient reports 0 COPD exacerbation(s) in the past year. The last exacerbation was treated in 2018. Patient reports a history of smoking with a current use of 1.75 packs per day. She reports a high level of dependency and requests assistance with cessation, but states she is not ready to quit yet.  Allergies  Allergen Reactions  . Prednisone Other (See Comments)    Pt reports "homicidal thoughts"  . Penicillins Rash    Has patient had a PCN reaction causing immediate rash, facial/tongue/throat swelling, SOB or lightheadedness with hypotension: Yes Has patient had a PCN reaction causing severe rash involving mucus membranes or skin necrosis: No Has patient had a PCN reaction that required hospitalization No Has patient had a PCN reaction occurring within the last 10 years: No If all of the above answers are "NO", then may proceed with Cephalosporin use.    Medication Sig  albuterol (PROVENTIL HFA;VENTOLIN HFA) 108 (90 Base) MCG/ACT inhaler Inhale 2 puffs into the lungs every 6 (six) hours as needed for wheezing or shortness of breath.  metroNIDAZOLE (FLAGYL) 500 MG tablet Take 1 tablet (500 mg total) by mouth 2 (two) times daily.  metroNIDAZOLE (METROGEL VAGINAL) 0.75 % vaginal gel 1 applicator full intravaginally twice a wk. Patient not taking: Reported on 09/21/2017  omeprazole (PRILOSEC) 20 MG capsule Take 1 capsule (20 mg total) by mouth daily.   Past Medical History:  Diagnosis Date  . Arthritis Dx 2006  . Frequent UTI   . Knee joint disorder   . Pneumonia   . UTI (lower urinary tract infection)   . Yeast infection    Social History   Socioeconomic History  .  Marital status: Single    Spouse name: Not on file  . Number of children: 3   . Years of education: GED   . Highest education level: Not on file  Occupational History  . Occupation: Unemployed   Social Needs  . Financial resource strain: Not on file  . Food insecurity:    Worry: Not on file    Inability: Not on file  . Transportation needs:    Medical: Not on file    Non-medical: Not on file  Tobacco Use  . Smoking status: Current Every Day Smoker    Packs/day: 1.50    Years: 30.00    Pack years: 45.00  . Smokeless tobacco: Never Used  Substance and Sexual Activity  . Alcohol use: Yes    Comment: socially   . Drug use: No  . Sexual activity: Not Currently    Birth control/protection: Post-menopausal  Lifestyle  . Physical activity:    Days per week: Not on file    Minutes per session: Not on file  . Stress: Not on file  Relationships  . Social connections:    Talks on phone: Not on file    Gets together: Not on file    Attends religious service: Not on file    Active member of club or organization: Not on file    Attends meetings of clubs or organizations: Not on file    Relationship status: Not on file  Other Topics Concern  . Not on file  Social History Narrative  3 children-   Age 68, 59, 35.       Has 55, 47 yo grandchild.    Mother passed away in 09/19/13.       Family History  Problem Relation Age of Onset  . Arthritis Mother        Rhematoid  . COPD Mother   . Heart disease Father   . Hypertension Father   . Diabetes Maternal Grandmother   . Thyroid disease Maternal Grandmother   . Cancer Maternal Aunt        breast   . Colon cancer Neg Hx    O: Ht Readings from Last 2 Encounters:  01/22/18 5' (1.524 m)  09/21/17 5' (1.524 m)   Wt Readings from Last 2 Encounters:  01/22/18 97 lb 6.4 oz (44.2 kg)  09/21/17 99 lb (44.9 kg)   There is no height or weight on file to calculate BMI.  CAT ASSESSMENT  Rank each of the following items on a scale  of 0 to 5 (with 5 being most severe) Write a # 0-5 in each box  I never cough (0) > I cough all the time (5) 5  I have no phlegm (mucus) in my chest (0) > My chest is completely full of phlegm (mucus) (5) 4  My chest does not feel tight at all (0) > My chest feels very tight (5) 2  When I walk up a hill or one flight of stairs I am not breathless (0) > When I walk up a hill or one flight of stairs I am very breathless (5) 3  I am not limited doing any activities at home (0) > I am very limited doing activities at home (5) 3  I am confident leaving my home despite my lung function (0) > I am not at all confident leaving my home because of my lung condition (5)  0  I sleep soundly (0) > I don't sleep soundly because of my lung condition (5) 0  I have lots of energy (0) > I have no energy at all (5) 5   Total CAT Score: 22  Most recent blood eosinophil count was 200 cells/microL in 2018.   A/P: . Based on a review of the patient's current COPD medications and symptoms, it is appropriate to de-prescribed the inhaled corticosteroid.  . Switch patient from Symbicort to Incruse, will provide a sample inhaler and apply for Incruse patient assistance program (will also apply for Ventolin and provide samples LOT [facility] exp Jan 2021 qty 2; LOT UP1S exp Mar 2021, qty 1). Counseled patient on proper use and possible side effects of the medication.  . Educated patient on proper use of inhalers, importance of medication adherence and identified other risk factors for COPD exacerbation.  . Counseled patient to report any changes in COPD medications, breathing, or shortness of breath.  . Brief counseling was provided and patient was referred to Quitline. Will also provide patient with NRT samples (Lot [id number] A exp 07/2019 qty 1, LOT [id number] F exp 09/2019 qty 1). . Will notify PCP of findings, changes, and plan for follow up.  Follow up phone call due in 1 and 3 months. Patient was advised to contact clinic if  concerns arise. The patient verbalized understanding of information provided by repeating back concepts discussed.

## 2018-08-20 MED ORDER — UMECLIDINIUM BROMIDE 62.5 MCG/INH IN AEPB: 1.0000 | INHALATION_SPRAY | Freq: Every day | RESPIRATORY_TRACT | 11 refills | Status: DC

## 2018-08-20 MED ORDER — ALBUTEROL SULFATE HFA 108 (90 BASE) MCG/ACT IN AERS: 2.0000 | INHALATION_SPRAY | Freq: Four times a day (QID) | RESPIRATORY_TRACT | 6 refills | Status: DC | PRN

## 2018-08-20 NOTE — Addendum Note (Signed)
Addended by: Forde Dandy on: 08/20/2018 01:02 PM   Modules accepted: Orders

## 2018-08-25 ENCOUNTER — Telehealth: Payer: Self-pay | Admitting: Internal Medicine

## 2018-08-25 NOTE — Telephone Encounter (Signed)
-----   Message from Forde Dandy, PharmD sent at 08/25/2018  9:49 AM EDT ----- Regarding: Patient question Hi Dr. Wynetta Emery, Sorry to bother you. Patient is requesting a treatment for BV and wondering if you would be able to prescribe an oral medication (had challenges with the gel in the past). Please let me know if there is anything I can do to help.  Thank you,  Delsa Sale

## 2018-08-25 NOTE — Telephone Encounter (Signed)
Contacted pt to schedule a televisit pt didn't answer lvm asking pt to give a call back to the office to schedule a televisit

## 2018-09-06 ENCOUNTER — Telehealth: Payer: Self-pay | Admitting: Pharmacist

## 2018-09-08 ENCOUNTER — Telehealth: Payer: Self-pay

## 2018-09-08 NOTE — Telephone Encounter (Signed)
Cynthia Duran is a 53 y.o. female reports to clinical pharmacist appointment for COPD medication management. Patient was called to follow up on transition from Symbicort to Incruse    Patient's current COPD medication regimen consists of: Incruse, albuterol   Patient denies COPD related symptoms. Patient does however report a headache.   Patient denies use of rescue inhaler .   Patient denies occurrence of a COPD exacerbation since last appointment.   Patient reports a history of smoking but states she has been able to cut out about 1 cigarette per day after starting to use the nicotine patch.   Allergies  Allergen Reactions  . Prednisone Other (See Comments)    Pt reports "homicidal thoughts"  . Penicillins Rash    Has patient had a PCN reaction causing immediate rash, facial/tongue/throat swelling, SOB or lightheadedness with hypotension: Yes Has patient had a PCN reaction causing severe rash involving mucus membranes or skin necrosis: No Has patient had a PCN reaction that required hospitalization No Has patient had a PCN reaction occurring within the last 10 years: No If all of the above answers are "NO", then may proceed with Cephalosporin use.    Prior to Admission medications   Medication Sig Start Date End Date Taking? Authorizing Provider  albuterol (PROVENTIL HFA;VENTOLIN HFA) 108 (90 Base) MCG/ACT inhaler Inhale 2 puffs into the lungs every 6 (six) hours as needed for wheezing or shortness of breath. 08/20/18   Ladell Pier, MD  metroNIDAZOLE (FLAGYL) 500 MG tablet Take 1 tablet (500 mg total) by mouth 2 (two) times daily. 01/28/18   Ladell Pier, MD  metroNIDAZOLE (METROGEL VAGINAL) 0.75 % vaginal gel 1 applicator full intravaginally twice a wk. Patient not taking: Reported on 09/21/2017 08/11/17   Ladell Pier, MD  omeprazole (PRILOSEC) 20 MG capsule Take 1 capsule (20 mg total) by mouth daily. 08/12/17   Ladell Pier, MD  umeclidinium bromide (INCRUSE  ELLIPTA) 62.5 MCG/INH AEPB Inhale 1 puff into the lungs daily. 08/20/18   Ladell Pier, MD   Past Medical History:  Diagnosis Date  . Arthritis Dx 2006  . Frequent UTI   . Knee joint disorder   . Pneumonia   . UTI (lower urinary tract infection)   . Yeast infection    Social History   Socioeconomic History  . Marital status: Single    Spouse name: Not on file  . Number of children: 3   . Years of education: GED   . Highest education level: Not on file  Occupational History  . Occupation: Unemployed   Social Needs  . Financial resource strain: Not on file  . Food insecurity:    Worry: Not on file    Inability: Not on file  . Transportation needs:    Medical: Not on file    Non-medical: Not on file  Tobacco Use  . Smoking status: Current Every Day Smoker    Packs/day: 1.50    Years: 30.00    Pack years: 45.00  . Smokeless tobacco: Never Used  Substance and Sexual Activity  . Alcohol use: Yes    Comment: socially   . Drug use: No  . Sexual activity: Not Currently    Birth control/protection: Post-menopausal  Lifestyle  . Physical activity:    Days per week: Not on file    Minutes per session: Not on file  . Stress: Not on file  Relationships  . Social connections:    Talks on phone: Not on  file    Gets together: Not on file    Attends religious service: Not on file    Active member of club or organization: Not on file    Attends meetings of clubs or organizations: Not on file    Relationship status: Not on file  Other Topics Concern  . Not on file  Social History Narrative   3 children-   Age 31, 20, 47.       Has 76, 98 yo grandchild.    Mother passed away in Sep 26, 2013.       Family History  Problem Relation Age of Onset  . Arthritis Mother        Rhematoid  . COPD Mother   . Heart disease Father   . Hypertension Father   . Diabetes Maternal Grandmother   . Thyroid disease Maternal Grandmother   . Cancer Maternal Aunt        breast   . Colon  cancer Neg Hx     O: Ht Readings from Last 2 Encounters:  01/22/18 5' (1.524 m)  09/21/17 5' (1.524 m)   Wt Readings from Last 2 Encounters:  01/22/18 97 lb 6.4 oz (44.2 kg)  09/21/17 99 lb (44.9 kg)   There is no height or weight on file to calculate BMI.  CAT ASSESSMENT  Rank each of the following items on a scale of 0 to 5 (with 5 being most severe) Write a # 0-5 in each box  I never cough (0) > I cough all the time (5) 0  I have no phlegm (mucus) in my chest (0) > My chest is completely full of phlegm (mucus) (5) 3  My chest does not feel tight at all (0) > My chest feels very tight (5) 2  When I walk up a hill or one flight of stairs I am not breathless (0) > When I walk up a hill or one flight of stairs I am very breathless (5) 0  I am not limited doing any activities at home (0) > I am very limited doing activities at home (5) 2  I am confident leaving my home despite my lung function (0) > I am not at all confident leaving my home because of my lung condition (5)  0  I sleep soundly (0) > I don't sleep soundly because of my lung condition (5) 0  I have lots of energy (0) > I have no energy at all (5) 4    Total CAT Score: 11 Most recent blood eosinophil count was 0.2 cells/microL taken on 03/09/2017.  A/P: Patient's COPD symptoms seem to be improving with a decreased CAT score today. Will continue patient on Incuse and follow up within the next 90 days.   Gwenlyn Found, Sherian Rein D PGY1 Pharmacy Resident  Phone [phone] 09/08/2018   12:36 PM

## 2018-09-08 NOTE — Telephone Encounter (Signed)
Called for COPD Management. Left a voicemail.

## 2018-09-21 ENCOUNTER — Other Ambulatory Visit: Payer: Self-pay | Admitting: Pharmacist

## 2018-09-21 DIAGNOSIS — N898 Other specified noninflammatory disorders of vagina: Secondary | ICD-10-CM

## 2018-10-28 ENCOUNTER — Telehealth: Payer: Self-pay

## 2018-10-28 NOTE — Telephone Encounter (Signed)
Cynthia Duran is a 53 y.o. female reports to clinical pharmacist appointment for COPD medication management. Patient did not bring medication.   Patient's current COPD medication regimen consists of: Incruse and albuterol , patient correctly reports how to use each inhaler.   Patient denies COPD related symptoms.   Patient denies use of rescue inhaler with an estimated use at about 0 times per week.   Patient denies occurrence of a COPD exacerbation since last appointment.   Patient reports a history of smoking with a current pack-per year of 63.  Allergies  Allergen Reactions  . Prednisone Other (See Comments)    Pt reports "homicidal thoughts"  . Penicillins Rash    Has patient had a PCN reaction causing immediate rash, facial/tongue/throat swelling, SOB or lightheadedness with hypotension: Yes Has patient had a PCN reaction causing severe rash involving mucus membranes or skin necrosis: No Has patient had a PCN reaction that required hospitalization No Has patient had a PCN reaction occurring within the last 10 years: No If all of the above answers are "NO", then may proceed with Cephalosporin use.     Current Outpatient Medications:  .  albuterol (PROVENTIL HFA;VENTOLIN HFA) 108 (90 Base) MCG/ACT inhaler, Inhale 2 puffs into the lungs every 6 (six) hours as needed for wheezing or shortness of breath., Disp: 1 Inhaler, Rfl: 6 .  metroNIDAZOLE (FLAGYL) 500 MG tablet, Take 1 tablet (500 mg total) by mouth 2 (two) times daily., Disp: 14 tablet, Rfl: 0 .  metroNIDAZOLE (METROGEL VAGINAL) 0.75 % vaginal gel, 1 applicator full intravaginally twice a wk. (Patient not taking: Reported on 09/21/2017), Disp: 70 g, Rfl: 1 .  omeprazole (PRILOSEC) 20 MG capsule, Take 1 capsule (20 mg total) by mouth daily., Disp: 30 capsule, Rfl: 3 .  umeclidinium bromide (INCRUSE ELLIPTA) 62.5 MCG/INH AEPB, Inhale 1 puff into the lungs daily., Disp: 30 each, Rfl: 11 Past Medical History:  Diagnosis Date  .  Arthritis Dx 2006  . Frequent UTI   . Knee joint disorder   . Pneumonia   . UTI (lower urinary tract infection)   . Yeast infection    Social History   Socioeconomic History  . Marital status: Single    Spouse name: Not on file  . Number of children: 3   . Years of education: GED   . Highest education level: Not on file  Occupational History  . Occupation: Unemployed   Social Needs  . Financial resource strain: Not on file  . Food insecurity    Worry: Not on file    Inability: Not on file  . Transportation needs    Medical: Not on file    Non-medical: Not on file  Tobacco Use  . Smoking status: Current Every Day Smoker    Packs/day: 1.50    Years: 30.00    Pack years: 45.00  . Smokeless tobacco: Never Used  Substance and Sexual Activity  . Alcohol use: Yes    Comment: socially   . Drug use: No  . Sexual activity: Not Currently    Birth control/protection: Post-menopausal  Lifestyle  . Physical activity    Days per week: Not on file    Minutes per session: Not on file  . Stress: Not on file  Relationships  . Social Herbalist on phone: Not on file    Gets together: Not on file    Attends religious service: Not on file    Active member of club or organization:  Not on file    Attends meetings of clubs or organizations: Not on file    Relationship status: Not on file  Other Topics Concern  . Not on file  Social History Narrative   3 children-   Age 35, 48, 82.       Has 58, 17 yo grandchild.    Mother passed away in 2013/10/02.       Family History  Problem Relation Age of Onset  . Arthritis Mother        Rhematoid  . COPD Mother   . Heart disease Father   . Hypertension Father   . Diabetes Maternal Grandmother   . Thyroid disease Maternal Grandmother   . Cancer Maternal Aunt        breast   . Colon cancer Neg Hx     O: Ht Readings from Last 2 Encounters:  01/22/18 5' (1.524 m)  09/21/17 5' (1.524 m)   Wt Readings from Last 2 Encounters:   01/22/18 97 lb 6.4 oz (44.2 kg)  09/21/17 99 lb (44.9 kg)   There is no height or weight on file to calculate BMI.  CAT ASSESSMENT  Rank each of the following items on a scale of 0 to 5 (with 5 being most severe) Write a # 0-5 in each box  I never cough (0) > I cough all the time (5) 4  I have no phlegm (mucus) in my chest (0) > My chest is completely full of phlegm (mucus) (5) 2  My chest does not feel tight at all (0) > My chest feels very tight (5) 0  When I walk up a hill or one flight of stairs I am not breathless (0) > When I walk up a hill or one flight of stairs I am very breathless (5) 4  I am not limited doing any activities at home (0) > I am very limited doing activities at home (5) 2  I am confident leaving my home despite my lung function (0) > I am not at all confident leaving my home because of my lung condition (5)  0  I sleep soundly (0) > I don't sleep soundly because of my lung condition (5) 0  I have lots of energy (0) > I have no energy at all (5) 1    Total CAT Score: 13 Most recent blood eosinophil count was 200 cells/microL taken on 03/09/2017.  A/P:  Findings/Recommendations:  . Patient's score increased by 2 points since last assessment. Patient said she doesn't want to change therapy because she is doing well on her current inhalers.   Will follow up in one month to reassess.

## 2018-11-19 ENCOUNTER — Telehealth: Payer: Self-pay

## 2018-11-19 NOTE — Telephone Encounter (Signed)
Cynthia Duran is a 53 y.o. female reports to clinical pharmacist appointment for COPD medication management. Patient did not bring medication.   Patient's current COPD medication regimen consists of: Incruse and albuterol, patient correctly reports only using the Incruse as needed.   Patient denies COPD related symptoms.  Patient reports use of rescue inhaler with an estimated use at about 0 times per week.    Allergies  Allergen Reactions  . Prednisone Other (See Comments)    Pt reports "homicidal thoughts"  . Penicillins Rash    Has patient had a PCN reaction causing immediate rash, facial/tongue/throat swelling, SOB or lightheadedness with hypotension: Yes Has patient had a PCN reaction causing severe rash involving mucus membranes or skin necrosis: No Has patient had a PCN reaction that required hospitalization No Has patient had a PCN reaction occurring within the last 10 years: No If all of the above answers are "NO", then may proceed with Cephalosporin use.     Current Outpatient Medications:  .  albuterol (PROVENTIL HFA;VENTOLIN HFA) 108 (90 Base) MCG/ACT inhaler, Inhale 2 puffs into the lungs every 6 (six) hours as needed for wheezing or shortness of breath., Disp: 1 Inhaler, Rfl: 6 .  metroNIDAZOLE (FLAGYL) 500 MG tablet, Take 1 tablet (500 mg total) by mouth 2 (two) times daily., Disp: 14 tablet, Rfl: 0 .  metroNIDAZOLE (METROGEL VAGINAL) 0.75 % vaginal gel, 1 applicator full intravaginally twice a wk. (Patient not taking: Reported on 09/21/2017), Disp: 70 g, Rfl: 1 .  omeprazole (PRILOSEC) 20 MG capsule, Take 1 capsule (20 mg total) by mouth daily., Disp: 30 capsule, Rfl: 3 .  umeclidinium bromide (INCRUSE ELLIPTA) 62.5 MCG/INH AEPB, Inhale 1 puff into the lungs daily., Disp: 30 each, Rfl: 11 Past Medical History:  Diagnosis Date  . Arthritis Dx 2006  . Frequent UTI   . Knee joint disorder   . Pneumonia   . UTI (lower urinary tract infection)   . Yeast infection     Social History   Socioeconomic History  . Marital status: Single    Spouse name: Not on file  . Number of children: 3   . Years of education: GED   . Highest education level: Not on file  Occupational History  . Occupation: Unemployed   Social Needs  . Financial resource strain: Not on file  . Food insecurity    Worry: Not on file    Inability: Not on file  . Transportation needs    Medical: Not on file    Non-medical: Not on file  Tobacco Use  . Smoking status: Current Every Day Smoker    Packs/day: 1.50    Years: 30.00    Pack years: 45.00  . Smokeless tobacco: Never Used  Substance and Sexual Activity  . Alcohol use: Yes    Comment: socially   . Drug use: No  . Sexual activity: Not Currently    Birth control/protection: Post-menopausal  Lifestyle  . Physical activity    Days per week: Not on file    Minutes per session: Not on file  . Stress: Not on file  Relationships  . Social Herbalist on phone: Not on file    Gets together: Not on file    Attends religious service: Not on file    Active member of club or organization: Not on file    Attends meetings of clubs or organizations: Not on file    Relationship status: Not on file  Other Topics  Concern  . Not on file  Social History Narrative   3 children-   Age 20, 95, 33.       Has 44, 69 yo grandchild.    Mother passed away in 09/05/13.       Family History  Problem Relation Age of Onset  . Arthritis Mother        Rhematoid  . COPD Mother   . Heart disease Father   . Hypertension Father   . Diabetes Maternal Grandmother   . Thyroid disease Maternal Grandmother   . Cancer Maternal Aunt        breast   . Colon cancer Neg Hx     O: Ht Readings from Last 2 Encounters:  01/22/18 5' (1.524 m)  09/21/17 5' (1.524 m)   Wt Readings from Last 2 Encounters:  01/22/18 97 lb 6.4 oz (44.2 kg)  09/21/17 99 lb (44.9 kg)   There is no height or weight on file to calculate BMI.  CAT ASSESSMENT   Rank each of the following items on a scale of 0 to 5 (with 5 being most severe) Write a # 0-5 in each box  I never cough (0) > I cough all the time (5) 5  I have no phlegm (mucus) in my chest (0) > My chest is completely full of phlegm (mucus) (5) 2  My chest does not feel tight at all (0) > My chest feels very tight (5) 3  When I walk up a hill or one flight of stairs I am not breathless (0) > When I walk up a hill or one flight of stairs I am very breathless (5) 4  I am not limited doing any activities at home (0) > I am very limited doing activities at home (5) 3  I am confident leaving my home despite my lung function (0) > I am not at all confident leaving my home because of my lung condition (5)  0  I sleep soundly (0) > I don't sleep soundly because of my lung condition (5) 0  I have lots of energy (0) > I have no energy at all (5) 2    Total CAT Score: 19 Most recent blood eosinophil count was 0.2 cells/microL taken on 03/09/17.  A/P:  Findings/Recommendations:  . No changes made to patient's COPD regimen.  . Pt CAT score worsen from the last assessment. Patient reported taking her Incruse as needed. Told to to take the Incruse daily and albuterol as needed. Will call back in one month to assess adherence and CAT score. . Counseled patient to report any changes in COPD medications, breathing, or shortness of breath.

## 2018-12-01 ENCOUNTER — Other Ambulatory Visit: Payer: Self-pay | Admitting: Pharmacist

## 2018-12-01 DIAGNOSIS — J449 Chronic obstructive pulmonary disease, unspecified: Secondary | ICD-10-CM

## 2018-12-01 MED ORDER — UMECLIDINIUM-VILANTEROL 62.5-25 MCG/INH IN AEPB: 1.0000 | INHALATION_SPRAY | Freq: Every day | RESPIRATORY_TRACT | 3 refills | Status: DC

## 2018-12-01 NOTE — Progress Notes (Signed)
Prescription printed for Anoro (switch from Incruse) due to ongoing COPD symptoms, see previous note for details.

## 2018-12-30 ENCOUNTER — Telehealth: Payer: Self-pay

## 2019-10-24 ENCOUNTER — Telehealth: Payer: Self-pay | Admitting: Internal Medicine

## 2019-10-24 NOTE — Telephone Encounter (Signed)
Patient is calling in asking to become a patient with Dr.Hunter, states her -Cynthia Duran - [phone] along with her parents are current patients.

## 2019-10-24 NOTE — Telephone Encounter (Signed)
Yes thanks-absolutely

## 2019-10-25 NOTE — Telephone Encounter (Signed)
This is fine 

## 2019-10-25 NOTE — Telephone Encounter (Signed)
LVM for patient to call the office back to get scheduled for an appt.

## 2020-01-05 NOTE — Patient Instructions (Addendum)
Health Maintenance Due  Topic Date Due  . COVID-19 Vaccine (1)- declines Never done  . HIV Screening - delcined Never done  . MAMMOGRAM - try to get plugged into one of the programs you did before 03/03/2018  . INFLUENZA VACCINE  - will complete later in flu season (please let us know if you get this at another location so we can update your chart) . We should have vaccination here in 1-2 months - can call back for an appointment.   12/11/2019   [email or web address] For smoking cessation Could also use 21mg  patch plus up to 5-6 pieces of gum Also call 1800 quit now- sometimes they have free patches  Try albuterol for shortness of breath  Hopefully metronidazole will clear up discharge- if not may be cheaper to look at health department for repeat testing  If d dimer is positive will have to scan the leg for blood clot- if you have worsening leg pain or shortness of breath over weekend- seek care in emergency room

## 2020-01-05 NOTE — Progress Notes (Signed)
Phone: [phone]   Subjective:  Patient presents today to establish care.  Prior patient of Dr. Wynetta Emery but 2 years ago.  Chief Complaint  Patient presents with  . New Patient (Initial Visit)  . Leg Pain    "sore to the touch" left leg, is a smoker  . STD panel    does have a new partner  . Nicotine Dependence    wanting to quit, has had success with patches in the past    See problem oriented charting  The following were reviewed and entered/updated in epic: Past Medical History:  Diagnosis Date  . Arthritis Dx 2006  . Frequent UTI   . Knee joint disorder    reports gel injections in past  . Pneumonia   . Yeast infection    Patient Active Problem List   Diagnosis Date Noted  . COPD (chronic obstructive pulmonary disease) ([place]) 09/13/2015    Priority: High  . Smoker 03/27/2014    Priority: High  . Abdominal bloating 03/20/2017    Priority: Medium  . Family history of early CAD 04/02/2016    Priority: Medium  . Hypersomnia 09/14/2015    Priority: Low  . Chronic fatigue 05/24/2015    Priority: Low  . Osteoarthritis 09/19/2014    Priority: Low  . Low back pain 03/27/2014    Priority: Low  . Early menopause 03/27/2014    Priority: Low   Past Surgical History:  Procedure Laterality Date  . cyst removed Right 2009    elbow, Dr. Silvio Pate   . LAPAROSCOPIC ABDOMINAL EXPLORATION      Family History  Problem Relation Age of Onset  . COPD Mother   . Rheum arthritis Mother        biological mom  . Heart disease Father   . Hypertension Father   . Diabetes Maternal Grandmother   . Thyroid disease Maternal Grandmother   . Hypertension Brother   . Psoriasis Brother   . Colon cancer Neg Hx     Medications- reviewed and updated Current Outpatient Medications  Medication Sig Dispense Refill  . albuterol (VENTOLIN HFA) 108 (90 Base) MCG/ACT inhaler Inhale 2 puffs into the lungs every 6 (six) hours as needed for wheezing or shortness of breath. Good rx 18 g 5  .  metroNIDAZOLE (FLAGYL) 500 MG tablet Take 1 tablet (500 mg total) by mouth 2 (two) times daily. Do not use alcohol while taking this medication. 14 tablet 0  . nicotine (NICODERM CQ - DOSED IN MG/24 HOURS) 21 mg/24hr patch Place 1 patch (21 mg total) onto the skin daily. 28 patch 5   No current facility-administered medications for this visit.    Allergies-reviewed and updated Allergies  Allergen Reactions  . Prednisone Other (See Comments)    Pt reports "homicidal thoughts"  . Penicillins Rash    Has patient had a PCN reaction causing immediate rash, facial/tongue/throat swelling, SOB or lightheadedness with hypotension: Yes Has patient had a PCN reaction causing severe rash involving mucus membranes or skin necrosis: No Has patient had a PCN reaction that required hospitalization No Has patient had a PCN reaction occurring within the last 10 years: No If all of the above answers are "NO", then may proceed with Cephalosporin use.     Social History   Social History Narrative   Divorced. 3 children-Age 24, 18, 16- in 2021. Has 39, 71 yo grandchildren   Mother passed away in 09/14/13.    Boyfriend in 2021.  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 Oropharynx normal. TM normal. Eyes: sclera and lids normal, PERRLA Neck: no thyromegaly, no cervical lymphadenopathy CV: RRR no murmurs rubs or gallops Lungs: CTAB no crackles, wheeze, rhonchi Abdomen: soft/nontender/nondistended/normal bowel sounds. No rebound or guarding.  Ext: no edema Skin: warm, dry Neuro: 5/5 strength in upper and lower extremities, normal gait, normal reflexes Msk: tender along inner upper thigh in an area around 1 cm wide and 10 cm  long Results for orders placed or performed in visit on 01/06/20 (from the past 24 hour(s))  POCT Urinalysis Dipstick (Automated)     Status: Normal   Collection Time: 01/06/20  5:17 PM  Result Value Ref Range   Color, UA yellow    Clarity, UA negative    Glucose, UA Negative Negative   Bilirubin, UA negative    Ketones, UA negative    Spec Grav, UA 1.015 1.010 - 1.025   Blood, UA negative    pH, UA 6.0 5.0 - 8.0   Protein, UA Negative Negative   Urobilinogen, UA 0.2 0.2 or 1.0 E.U./dL   Nitrite, UA negative    Leukocytes, UA Negative Negative      Assessment and Plan:   #Smoking-smokes a pack and 1/2/day.  Has had some success in the past with nicotine patches-we will try 1 mg patch that she will likely need some gum on top of this to help her. - she believes hypnosis may help and I reviewed a resource  For that locally- [employer] and showed group classes which would be more cost effective  #COPD S: Patient previously on Anoro-did not use consistently.  Since she has been off of this though has had worsening issues with breathing.  Has not had albuterol recently. A/P: Anoro and other inhalers are likely caused prohibitive-albuterol on good Rx at least looks to be under $25-she will try albuterol as needed to see how that will help her.  I am going to remove Anoro from her medication list due to cost concerns  #vaginal discharge -recent new boyfriend but has not been sexually active yet- last sex was in 2017 (unprotected with now ex husband) and was more challenging with COPD.  intermittent vaginal discharge noted and she was wanting to make sure she was "clean" before sex- with that being said No new partners since 2017 and has tested negative for vaginal stds in 2019.  Has had multiple cases of bacterial vaginosis though and this feels typical. When has discharge has an almost metallic smell -we will empirically treat with flagyl to help with cost savings as I am unsure of the  cost of our vaginal cytology test - since she has not had new partners does not have to be retested for STDs urgently- instead offered health department as an option if she wants more cost effective testing.   # Left inner thigh pain S: pain for several months. Not worsening. Feels like a long a blood vessel. Worries about blood clot.  Tender to touch. No calf pain A/P: im not sure what is causing her pain. DVT duplex would be costly-  she opts to start with d dimer and only seek DVT duplex venous if d dimer is elevated.   -counseled on emergent ER precautions for over the weekend  #hyperlipidemia S: Medication:none  Lab Results  Component Value Date   CHOL 173 04/01/2016   HDL 53 04/01/2016   LDLCALC 100 (H) 04/01/2016   TRIG 102 04/01/2016   CHOLHDL 3.3 04/01/2016   A/P: patient states she did not think she had high cholesterol- reviewed newer guidelines and LDL goal under 70. With her smoking history I would want to be aggressive if 10 year risk above 7.5% so will recheck lipids and calculate risk/consider statin. For inner thigh pain- not with walking and does not sound claudication related   Recommended follow up: we did not schedule planned follow up- may make this based off of labs   Meds ordered this encounter  Medications  . nicotine (NICODERM CQ - DOSED IN MG/24 HOURS) 21 mg/24hr patch    Sig: Place 1 patch (21 mg total) onto the skin daily.    Dispense:  28 patch    Refill:  5    Run through goodrx- has no insurance  . albuterol (VENTOLIN HFA) 108 (90 Base) MCG/ACT inhaler    Sig: Inhale 2 puffs into the lungs every 6 (six) hours as needed for wheezing or shortness of breath. Good rx    Dispense:  18 g    Refill:  5  . metroNIDAZOLE (FLAGYL) 500 MG tablet    Sig: Take 1 tablet (500 mg total) by mouth 2 (two) times daily. Do not use alcohol while taking this medication.    Dispense:  14 tablet    Refill:  0    Time Spent: 55 minutes of total time (4:35 PM- 5:20 PM,  7:55-8:05) was spent on the date of the encounter performing the following actions: chart review prior to seeing the patient, obtaining history, performing a medically necessary exam, counseling on the treatment plan, placing orders, and documenting in our EHR. Extra time needed in light of SDOH: no insurance and trying to be as cost effective as possible and counsel patient on options.   Return precautions advised. Garret Reddish, MD

## 2020-01-06 ENCOUNTER — Ambulatory Visit: Payer: Self-pay | Admitting: Family Medicine

## 2020-01-06 ENCOUNTER — Encounter: Payer: Self-pay | Admitting: Family Medicine

## 2020-01-06 ENCOUNTER — Other Ambulatory Visit: Payer: Self-pay

## 2020-01-06 VITALS — BP 120/70 | HR 77 | Temp 98.3°F | Wt 94.8 lb

## 2020-01-06 DIAGNOSIS — M1712 Unilateral primary osteoarthritis, left knee: Secondary | ICD-10-CM

## 2020-01-06 DIAGNOSIS — E28319 Asymptomatic premature menopause: Secondary | ICD-10-CM

## 2020-01-06 DIAGNOSIS — E785 Hyperlipidemia, unspecified: Secondary | ICD-10-CM

## 2020-01-06 DIAGNOSIS — G4710 Hypersomnia, unspecified: Secondary | ICD-10-CM

## 2020-01-06 DIAGNOSIS — J449 Chronic obstructive pulmonary disease, unspecified: Secondary | ICD-10-CM

## 2020-01-06 DIAGNOSIS — M79605 Pain in left leg: Secondary | ICD-10-CM

## 2020-01-06 DIAGNOSIS — Z1159 Encounter for screening for other viral diseases: Secondary | ICD-10-CM

## 2020-01-06 LAB — POC URINALSYSI DIPSTICK (AUTOMATED)
Bilirubin, UA: NEGATIVE
Blood, UA: NEGATIVE
Clarity, UA: NEGATIVE
Glucose, UA: NEGATIVE
Ketones, UA: NEGATIVE
Leukocytes, UA: NEGATIVE
Nitrite, UA: NEGATIVE
Protein, UA: NEGATIVE
Spec Grav, UA: 1.015 (ref 1.010–1.025)
Urobilinogen, UA: 0.2 E.U./dL
pH, UA: 6 (ref 5.0–8.0)

## 2020-01-06 MED ORDER — NICOTINE 21 MG/24HR TD PT24: 21.0000 mg | MEDICATED_PATCH | Freq: Every day | TRANSDERMAL | 5 refills | Status: DC

## 2020-01-06 MED ORDER — ALBUTEROL SULFATE HFA 108 (90 BASE) MCG/ACT IN AERS: 2.0000 | INHALATION_SPRAY | Freq: Four times a day (QID) | RESPIRATORY_TRACT | 5 refills | Status: DC | PRN

## 2020-01-06 MED ORDER — METRONIDAZOLE 500 MG PO TABS: 500.0000 mg | ORAL_TABLET | Freq: Two times a day (BID) | ORAL | 0 refills | Status: DC

## 2020-01-07 LAB — COMPLETE METABOLIC PANEL WITH GFR
AG Ratio: 2 (calc) (ref 1.0–2.5)
ALT: 12 U/L (ref 6–29)
AST: 16 U/L (ref 10–35)
Albumin: 4.4 g/dL (ref 3.6–5.1)
Alkaline phosphatase (APISO): 65 U/L (ref 37–153)
BUN/Creatinine Ratio: 9 (calc) (ref 6–22)
BUN: 6 mg/dL — ABNORMAL LOW (ref 7–25)
CO2: 28 mmol/L (ref 20–32)
Calcium: 9.1 mg/dL (ref 8.6–10.4)
Chloride: 105 mmol/L (ref 98–110)
Creat: 0.69 mg/dL (ref 0.50–1.05)
GFR, Est African American: 117 mL/min/{1.73_m2} (ref >=60)
GFR, Est Non African American: 101 mL/min/{1.73_m2} (ref >=60)
Globulin: 2.2 g/dL (calc) (ref 1.9–3.7)
Glucose, Bld: 109 mg/dL — ABNORMAL HIGH (ref 65–99)
Potassium: 3.9 mmol/L (ref 3.5–5.3)
Sodium: 140 mmol/L (ref 135–146)
Total Bilirubin: 0.4 mg/dL (ref 0.2–1.2)
Total Protein: 6.6 g/dL (ref 6.1–8.1)

## 2020-01-07 LAB — CBC WITH DIFFERENTIAL/PLATELET
Absolute Monocytes: 632 cells/uL (ref 200–950)
Basophils Absolute: 101 cells/uL (ref 0–200)
Basophils Relative: 1.3 %
Eosinophils Absolute: 148 cells/uL (ref 15–500)
Eosinophils Relative: 1.9 %
HCT: 43.8 % (ref 35.0–45.0)
Hemoglobin: 14.7 g/dL (ref 11.7–15.5)
Lymphs Abs: 3689 cells/uL (ref 850–3900)
MCH: 31.3 pg (ref 27.0–33.0)
MCHC: 33.6 g/dL (ref 32.0–36.0)
MCV: 93.4 fL (ref 80.0–100.0)
MPV: 10.3 fL (ref 7.5–12.5)
Monocytes Relative: 8.1 %
Neutro Abs: 3229 cells/uL (ref 1500–7800)
Neutrophils Relative %: 41.4 %
Platelets: 221 10*3/uL (ref 140–400)
RBC: 4.69 10*6/uL (ref 3.80–5.10)
RDW: 12.4 % (ref 11.0–15.0)
Total Lymphocyte: 47.3 %
WBC: 7.8 10*3/uL (ref 3.8–10.8)

## 2020-01-07 LAB — LIPID PANEL (REFL)
Cholesterol: 152 mg/dL (ref <200)
HDL: 53 mg/dL (ref >=50)
LDL Cholesterol (Calc): 84 mg/dL (calc)
Non-HDL Cholesterol (Calc): 99 mg/dL (calc) (ref <130)
Total CHOL/HDL Ratio: 2.9 (calc) (ref <5.0)
Triglycerides: 69 mg/dL (ref <150)

## 2020-01-07 LAB — D-DIMER, QUANTITATIVE: D-Dimer, Quant: 0.25 mcg/mL FEU (ref <0.50)

## 2020-01-07 LAB — TSH: TSH: 1.24 mIU/L

## 2020-04-14 ENCOUNTER — Other Ambulatory Visit: Payer: Self-pay

## 2020-04-14 ENCOUNTER — Encounter (HOSPITAL_BASED_OUTPATIENT_CLINIC_OR_DEPARTMENT_OTHER): Payer: Self-pay | Admitting: Emergency Medicine

## 2020-04-14 DIAGNOSIS — R350 Frequency of micturition: Secondary | ICD-10-CM | POA: Insufficient documentation

## 2020-04-14 DIAGNOSIS — F17200 Nicotine dependence, unspecified, uncomplicated: Secondary | ICD-10-CM | POA: Insufficient documentation

## 2020-04-14 DIAGNOSIS — J449 Chronic obstructive pulmonary disease, unspecified: Secondary | ICD-10-CM | POA: Insufficient documentation

## 2020-04-14 DIAGNOSIS — R1084 Generalized abdominal pain: Secondary | ICD-10-CM | POA: Insufficient documentation

## 2020-04-14 LAB — URINALYSIS, ROUTINE W REFLEX MICROSCOPIC
Bilirubin Urine: NEGATIVE
Glucose, UA: NEGATIVE mg/dL
Ketones, ur: NEGATIVE mg/dL
Leukocytes,Ua: NEGATIVE
Nitrite: NEGATIVE
Protein, ur: NEGATIVE mg/dL
Specific Gravity, Urine: <1.005 — ABNORMAL LOW (ref 1.005–1.030)
pH: 6.5 (ref 5.0–8.0)

## 2020-04-14 LAB — URINALYSIS, MICROSCOPIC (REFLEX)

## 2020-04-14 NOTE — ED Triage Notes (Signed)
Reports right flank pain for the last three days.  Denies any urinary issues.  Does endorse having a ct scan years ago that showed a stone but unsure that she ever passed it.  Also endorses nausea.  Drinking coffee in triage.  Taking tylenol and percocet for pain.

## 2020-04-15 ENCOUNTER — Emergency Department (HOSPITAL_BASED_OUTPATIENT_CLINIC_OR_DEPARTMENT_OTHER)
Admission: EM | Admit: 2020-04-15 | Discharge: 2020-04-15 | Disposition: A | Discharge status: Home / Self Care | Payer: Self-pay | Attending: Emergency Medicine | Admitting: Emergency Medicine

## 2020-04-15 ENCOUNTER — Encounter (HOSPITAL_BASED_OUTPATIENT_CLINIC_OR_DEPARTMENT_OTHER): Payer: Self-pay

## 2020-04-15 ENCOUNTER — Emergency Department (HOSPITAL_BASED_OUTPATIENT_CLINIC_OR_DEPARTMENT_OTHER): Payer: Self-pay

## 2020-04-15 DIAGNOSIS — R1084 Generalized abdominal pain: Secondary | ICD-10-CM

## 2020-04-15 LAB — CBC WITH DIFFERENTIAL/PLATELET
Abs Immature Granulocytes: 0.02 10*3/uL (ref 0.00–0.07)
Basophils Absolute: 0.1 10*3/uL (ref 0.0–0.1)
Basophils Relative: 1 %
Eosinophils Absolute: 0.2 10*3/uL (ref 0.0–0.5)
Eosinophils Relative: 3 %
HCT: 45 % (ref 36.0–46.0)
Hemoglobin: 15.3 g/dL — ABNORMAL HIGH (ref 12.0–15.0)
Immature Granulocytes: 0 %
Lymphocytes Relative: 45 %
Lymphs Abs: 3.8 10*3/uL (ref 0.7–4.0)
MCH: 31.7 pg (ref 26.0–34.0)
MCHC: 34 g/dL (ref 30.0–36.0)
MCV: 93.2 fL (ref 80.0–100.0)
Monocytes Absolute: 1 10*3/uL (ref 0.1–1.0)
Monocytes Relative: 12 %
Neutro Abs: 3.3 10*3/uL (ref 1.7–7.7)
Neutrophils Relative %: 39 %
Platelets: 212 10*3/uL (ref 150–400)
RBC: 4.83 MIL/uL (ref 3.87–5.11)
RDW: 12.2 % (ref 11.5–15.5)
WBC: 8.5 10*3/uL (ref 4.0–10.5)
nRBC: 0 % (ref 0.0–0.2)

## 2020-04-15 LAB — COMPREHENSIVE METABOLIC PANEL
ALT: 12 U/L (ref 0–44)
AST: 17 U/L (ref 15–41)
Albumin: 4.2 g/dL (ref 3.5–5.0)
Alkaline Phosphatase: 63 U/L (ref 38–126)
Anion gap: 9 (ref 5–15)
BUN: 5 mg/dL — ABNORMAL LOW (ref 6–20)
CO2: 27 mmol/L (ref 22–32)
Calcium: 9.2 mg/dL (ref 8.9–10.3)
Chloride: 105 mmol/L (ref 98–111)
Creatinine, Ser: 0.63 mg/dL (ref 0.44–1.00)
GFR, Estimated: >60 mL/min (ref >60)
Glucose, Bld: 95 mg/dL (ref 70–99)
Potassium: 3.8 mmol/L (ref 3.5–5.1)
Sodium: 141 mmol/L (ref 135–145)
Total Bilirubin: 0.4 mg/dL (ref 0.3–1.2)
Total Protein: 7.3 g/dL (ref 6.5–8.1)

## 2020-04-15 LAB — LIPASE, BLOOD: Lipase: 33 U/L (ref 11–51)

## 2020-04-15 MED ORDER — ONDANSETRON HCL 4 MG/2ML IJ SOLN
4.0000 mg | Freq: Once | INTRAMUSCULAR | Status: AC
  Administered 2020-04-15: 4 mg via INTRAVENOUS
  Filled 2020-04-15: qty 2

## 2020-04-15 MED ORDER — OXYCODONE-ACETAMINOPHEN 5-325 MG PO TABS
1.0000 | ORAL_TABLET | Freq: Three times a day (TID) | ORAL | 0 refills | Status: DC | PRN
Start: 2020-04-15 — End: 2021-07-26

## 2020-04-15 MED ORDER — FENTANYL CITRATE (PF) 100 MCG/2ML IJ SOLN
100.0000 ug | Freq: Once | INTRAMUSCULAR | Status: AC
  Administered 2020-04-15: 100 ug via INTRAVENOUS
  Filled 2020-04-15: qty 2

## 2020-04-15 MED ORDER — IOHEXOL 300 MG/ML  SOLN
75.0000 mL | Freq: Once | INTRAMUSCULAR | Status: AC | PRN
  Administered 2020-04-15: 75 mL via INTRAVENOUS

## 2020-04-15 NOTE — Discharge Instructions (Addendum)
  SEEK IMMEDIATE MEDICAL ATTENTION IF: The pain  becomes severe, particularly over the next 8-12 hours.  A temperature above 100.73F develops.  Repeated vomiting occurs (multiple episodes).  The pain becomes localized to portions of the abdomen.   Blood is being passed in stools or vomit (bright red or black tarry stools).  Return also if you develop chest pain, difficulty breathing, dizziness or fainting, or become confused, poorly responsive, or inconsolable.

## 2020-04-15 NOTE — ED Provider Notes (Signed)
[facility] [place] EMERGENCY DEPARTMENT Provider Note   CSN: [id number] Arrival date & time: 04/14/20  1928     History Chief Complaint  Patient presents with  . Flank Pain    Cynthia Duran is a 54 y.o. female.  The history is provided by the patient.  Flank Pain This is a new problem. The current episode started more than 2 days ago. The problem occurs daily. The problem has been gradually worsening. Associated symptoms include abdominal pain. Pertinent negatives include no chest pain and no shortness of breath. Exacerbated by: movement/palpation. Nothing relieves the symptoms. She has tried nothing for the symptoms.  Patient reports onset of right flank and abdominal pain approximately 3 days ago.  No trauma or falls.  Pain now radiates throughout her abdomen.  No fevers or vomiting.  No chest pain/shortness of breath.  No cough.  No dysuria.  She does report urinary frequency.  No vaginal bleeding/discharge.  She does report recent onset of sexual relationship and reports mild dyspareunia.  She reports recently checked for STDs that were negative. She took an old "emergency" percocet she had at home but doesn't have any other meds     Past Medical History:  Diagnosis Date  . Arthritis Dx 2006  . Frequent UTI   . Knee joint disorder    reports gel injections in past  . Pneumonia   . Yeast infection     Patient Active Problem List   Diagnosis Date Noted  . Abdominal bloating 03/20/2017  . Family history of early CAD 04/02/2016  . Hypersomnia 09/14/2015  . COPD (chronic obstructive pulmonary disease) ([place]) 09/13/2015  . Chronic fatigue 05/24/2015  . Osteoarthritis 09/19/2014  . Smoker 03/27/2014  . Low back pain 03/27/2014  . Early menopause 03/27/2014    Past Surgical History:  Procedure Laterality Date  . cyst removed Right 2009    elbow, Dr. Silvio Pate   . LAPAROSCOPIC ABDOMINAL EXPLORATION       OB History    Gravida  4   Para  3   Term  3   Preterm       AB  1   Living  3     SAB  1   TAB      Ectopic      Multiple      Live Births              Family History  Problem Relation Age of Onset  . COPD Mother   . Rheum arthritis Mother        biological mom  . Heart disease Father   . Hypertension Father   . Diabetes Maternal Grandmother   . Thyroid disease Maternal Grandmother   . Hypertension Brother   . Psoriasis Brother   . Colon cancer Neg Hx     Social History   Tobacco Use  . Smoking status: Current Every Day Smoker    Packs/day: 1.50    Years: 30.00    Pack years: 45.00  . Smokeless tobacco: Never Used  Substance Use Topics  . Alcohol use: Yes    Comment: socially   . Drug use: No    Home Medications Prior to Admission medications   Medication Sig Start Date End Date Taking? Authorizing Provider  acetaminophen (TYLENOL) 325 MG tablet Take 650 mg by mouth every 6 (six) hours as needed.   Yes [provider]  oxyCODONE-acetaminophen (PERCOCET/ROXICET) 5-325 MG tablet Take by mouth every 4 (four) hours as  needed for severe pain.   Yes [provider]  albuterol (VENTOLIN HFA) 108 (90 Base) MCG/ACT inhaler Inhale 2 puffs into the lungs every 6 (six) hours as needed for wheezing or shortness of breath. Good rx 01/06/20   Marin Olp, MD  metroNIDAZOLE (FLAGYL) 500 MG tablet Take 1 tablet (500 mg total) by mouth 2 (two) times daily. Do not use alcohol while taking this medication. 01/06/20   Marin Olp, MD  nicotine (NICODERM CQ - DOSED IN MG/24 HOURS) 21 mg/24hr patch Place 1 patch (21 mg total) onto the skin daily. 01/06/20   Marin Olp, MD    Allergies    Prednisone and Penicillins  Review of Systems   Review of Systems  Constitutional: Negative for fever.  Respiratory: Negative for shortness of breath.   Cardiovascular: Negative for chest pain.  Gastrointestinal: Positive for abdominal pain. Negative for vomiting.  Genitourinary: Positive for dyspareunia,  flank pain and frequency.  All other systems reviewed and are negative.   Physical Exam Updated Vital Signs BP 127/84 (BP Location: Right Arm)   Pulse 79   Temp 98.1 F (36.7 C) (Oral)   Resp 19   Ht 1.524 m (5')   Wt 43.5 kg   LMP 09/27/2007 (Exact Date)   SpO2 97%   BMI 18.75 kg/m   Physical Exam CONSTITUTIONAL: Well developed/well nourished HEAD: Normocephalic/atraumatic EYES: EOMI/PERRL ENMT: Mucous membranes moist NECK: supple no meningeal signs SPINE/BACK:entire spine nontender CV: S1/S2 noted, no murmurs/rubs/gallops noted LUNGS: Lungs are clear to auscultation bilaterally, no apparent distress ABDOMEN: soft, diffuse moderate tenderness noted throughout 4 quadrants, no rebound or guarding, bowel sounds noted throughout abdomen GU: Right cva tenderness NEURO: Pt is awake/alert/appropriate, moves all extremitiesx4.  No facial droop.   EXTREMITIES: pulses normal/equal, full ROM SKIN: warm, color normal PSYCH: no abnormalities of mood noted, alert and oriented to situation  ED Results / Procedures / Treatments   Labs (all labs ordered are listed, but only abnormal results are displayed) Labs Reviewed  URINALYSIS, ROUTINE W REFLEX MICROSCOPIC - Abnormal; Notable for the following components:      Result Value   Specific Gravity, Urine <1.005 (*)    Hgb urine dipstick SMALL (*)    All other components within normal limits  URINALYSIS, MICROSCOPIC (REFLEX) - Abnormal; Notable for the following components:   Bacteria, UA RARE (*)    All other components within normal limits  CBC WITH DIFFERENTIAL/PLATELET - Abnormal; Notable for the following components:   Hemoglobin 15.3 (*)    All other components within normal limits  COMPREHENSIVE METABOLIC PANEL - Abnormal; Notable for the following components:   BUN 5 (*)    All other components within normal limits  LIPASE, BLOOD    EKG None  Radiology CT ABDOMEN PELVIS W CONTRAST  Result Date: 04/15/2020 CLINICAL  DATA:  Right flank pain EXAM: CT ABDOMEN AND PELVIS WITH CONTRAST TECHNIQUE: Multidetector CT imaging of the abdomen and pelvis was performed using the standard protocol following bolus administration of intravenous contrast. CONTRAST:  70mL OMNIPAQUE IOHEXOL 300 MG/ML  SOLN COMPARISON:  None. FINDINGS: LOWER CHEST: Normal. HEPATOBILIARY: Normal hepatic contours. No intra- or extrahepatic biliary dilatation. The gallbladder is normal. PANCREAS: Normal pancreas. No ductal dilatation or peripancreatic fluid collection. SPLEEN: Normal. ADRENALS/URINARY TRACT: The adrenal glands are normal. No hydronephrosis, nephroureterolithiasis or solid renal mass. The urinary bladder is normal for degree of distention STOMACH/BOWEL: There is no hiatal hernia. Normal duodenal course and caliber. No small bowel dilatation  or inflammation. No focal colonic abnormality. Normal appendix. VASCULAR/LYMPHATIC: There is calcific atherosclerosis of the abdominal aorta. No lymphadenopathy. REPRODUCTIVE: Normal uterus. No adnexal mass. MUSCULOSKELETAL. No bony spinal canal stenosis or focal osseous abnormality. OTHER: None. IMPRESSION: 1. No acute abnormality of the abdomen or pelvis. Aortic Atherosclerosis (ICD10-I70.0). Electronically Signed   By: Ulyses Jarred M.D.   On: 04/15/2020 04:10    Procedures Procedures   Medications Ordered in ED Medications  fentaNYL (SUBLIMAZE) injection 100 mcg (100 mcg Intravenous Given 04/15/20 0131)  ondansetron (ZOFRAN) injection 4 mg (4 mg Intravenous Given 04/15/20 0131)  iohexol (OMNIPAQUE) 300 MG/ML solution 75 mL (75 mLs Intravenous Contrast Given 04/15/20 0037)    ED Course  I have reviewed the triage vital signs and the nursing notes.  Pertinent labs & imaging results that were available during my care of the patient were reviewed by me and considered in my medical decision making (see chart for details).    MDM Rules/Calculators/A&P                           This patient presents  to the ED for concern of abdominal pain, this involves an extensive number of treatment options, and is a complaint that carries with it a high risk of complications and morbidity.  The differential diagnosis includes appendicitis, kidney stones, cholecystitis, pancreatitis, UTI, bowel perforation, ovarian cyst   Lab Tests:   I Ordered, reviewed, and interpreted labs, which included urinalysis, complete blood count, metabolic panel, lipase  Medicines ordered:   I ordered medication fentanyl for pain   Imaging Studies ordered:   I ordered imaging studies which included CT abd/pelvis and  I independently visualized and interpreted imaging which showed no acute findings  Additional history obtained:    Previous records obtained and reviewed   Reevaluation:  After the interventions stated above, I reevaluated the patient and found patient is improved   4:41 AM Patient presented with generalized abdominal pain, right greater than left side.  She now reports that it seemed to occur after having a salad that caused significant heartburn.  Extensive work-up does not reveal any acute emergent findings.  Patient is overall feeling improved.  I advised ongoing outpatient work-up is warranted if pain continues over the next several days.  She will follow-up with her PCP if this pain continues.  She request a short course of pain medication just in case it gets worse. Final Clinical Impression(s) / ED Diagnoses Final diagnoses:  Generalized abdominal pain    Rx / DC Orders ED Discharge Orders         Ordered    oxyCODONE-acetaminophen (PERCOCET) 5-325 MG tablet  Every 8 hours PRN        04/15/20 0438           Ripley Fraise, MD 04/15/20 [phone]

## 2020-12-26 ENCOUNTER — Ambulatory Visit (INDEPENDENT_AMBULATORY_CARE_PROVIDER_SITE_OTHER): Payer: Self-pay | Admitting: Family Medicine

## 2020-12-26 ENCOUNTER — Other Ambulatory Visit: Payer: Self-pay

## 2020-12-26 ENCOUNTER — Encounter: Payer: Self-pay | Admitting: Family Medicine

## 2020-12-26 VITALS — BP 127/85 | HR 81 | Temp 98.3°F | Wt 95.6 lb

## 2020-12-26 DIAGNOSIS — N760 Acute vaginitis: Secondary | ICD-10-CM

## 2020-12-26 DIAGNOSIS — R309 Painful micturition, unspecified: Secondary | ICD-10-CM

## 2020-12-26 DIAGNOSIS — B9689 Other specified bacterial agents as the cause of diseases classified elsewhere: Secondary | ICD-10-CM

## 2020-12-26 LAB — POC URINALSYSI DIPSTICK (AUTOMATED)
Bilirubin, UA: NEGATIVE
Blood, UA: NEGATIVE
Glucose, UA: NEGATIVE
Ketones, UA: NEGATIVE
Leukocytes, UA: NEGATIVE
Nitrite, UA: NEGATIVE
Protein, UA: NEGATIVE
Spec Grav, UA: 1.01 (ref 1.010–1.025)
Urobilinogen, UA: 0.2 E.U./dL
pH, UA: 6 (ref 5.0–8.0)

## 2020-12-26 MED ORDER — METRONIDAZOLE 500 MG PO TABS: 500.0000 mg | ORAL_TABLET | Freq: Two times a day (BID) | ORAL | 0 refills | Status: DC

## 2020-12-26 NOTE — Patient Instructions (Addendum)
Team please get UA and culture under urinalysis . We will reach out about possible BV treatment  Recommended follow up: Return for as needed for new, worsening, persistent symptoms.

## 2020-12-26 NOTE — Progress Notes (Signed)
Phone [phone] In person visit   Subjective:   Cynthia Duran is a 55 y.o. year old very pleasant female patient who presents for/with See problem oriented charting Chief Complaint  Patient presents with   Abdominal Pain   Back Pain   urine odor    Dark urine.     This visit occurred during the SARS-CoV-2 public health emergency.  Safety protocols were in place, including screening questions prior to the visit, additional usage of staff PPE, and extensive cleaning of exam room while observing appropriate contact time as indicated for disinfecting solutions.   Past Medical History-  Patient Active Problem List   Diagnosis Date Noted   COPD (chronic obstructive pulmonary disease) ([place]) 09/13/2015    Priority: High   Smoker 03/27/2014    Priority: High   Abdominal bloating 03/20/2017    Priority: Medium   Family history of early CAD 04/02/2016    Priority: Medium   Hypersomnia 09/14/2015    Priority: Low   Chronic fatigue 05/24/2015    Priority: Low   Osteoarthritis 09/19/2014    Priority: Low   Low back pain 03/27/2014    Priority: Low   Early menopause 03/27/2014    Priority: Low    Medications- reviewed and updated Current Outpatient Medications  Medication Sig Dispense Refill   acetaminophen (TYLENOL) 325 MG tablet Take 650 mg by mouth every 6 (six) hours as needed.     albuterol (VENTOLIN HFA) 108 (90 Base) MCG/ACT inhaler Inhale 2 puffs into the lungs every 6 (six) hours as needed for wheezing or shortness of breath. Good rx 18 g 5   metroNIDAZOLE (FLAGYL) 500 MG tablet Take 1 tablet (500 mg total) by mouth 2 (two) times daily. Do not use alcohol while taking this medication. 14 tablet 0   oxyCODONE-acetaminophen (PERCOCET) 5-325 MG tablet Take 1 tablet by mouth every 8 (eight) hours as needed. 6 tablet 0   nicotine (NICODERM CQ - DOSED IN MG/24 HOURS) 21 mg/24hr patch Place 1 patch (21 mg total) onto the skin daily. (Patient not taking: Reported on 12/26/2020)  28 patch 5   No current facility-administered medications for this visit.     Objective:  BP 127/85   Pulse 81   Temp 98.3 F (36.8 C) (Temporal)   Wt 95 lb 9.6 oz (43.4 kg)   LMP 09/27/2007 (Exact Date)   SpO2 98%   BMI 18.67 kg/m  Gen: NAD, resting comfortably CV: RRR no murmurs rubs or gallops Lungs: CTAB no crackles, wheeze, rhonchi Abdomen: soft/nontender upper abdomen- mild lower abdominal pain worse on right side /nondistended/normal bowel sounds. No rebound or guarding.  Ext: no edema Skin: warm, dry  Results for orders placed or performed in visit on 12/26/20 (from the past 24 hour(s))  POCT Urinalysis Dipstick (Automated)     Status: Normal   Collection Time: 12/26/20 12:21 PM  Result Value Ref Range   Color, UA yellow    Clarity, UA clear    Glucose, UA Negative Negative   Bilirubin, UA neg    Ketones, UA neg    Spec Grav, UA 1.010 1.010 - 1.025   Blood, UA neg    pH, UA 6.0 5.0 - 8.0   Protein, UA Negative Negative   Urobilinogen, UA 0.2 0.2 or 1.0 E.U./dL   Nitrite, UA neg    Leukocytes, UA Negative Negative       Assessment and Plan   #Concern for UTI S: Patients symptoms started last night with dark  urine and burning with peeing. She was in for a visit today with a family member and requested urine be checked- we asked to set up a visit to appropriately fully evaluate.  Complains of dysuria: yes; polyuria: no; nocturia: yes; urgency: no. Lower abdominal pain and cramping - has felt similar prior UTIS Symptoms are worsening today.  ROS- no fever, chills,  flank pain. No blood in urine. One episode of emesis after coughing fit  - some vaginal discharge as well. Some pain with sex over last week. Saem long term partner of 1 year- unprotected. Has had more issues with BV since getting IUD taken out- LMP Sep 27 2007- removed Jun 29 2012 -current symptoms feel more like BV she has had in the past Bowel movements normal for her. No blood in stool.   A/P:  UA  without clear UTI- Will get culture. Empiric treatment with: flagyl for BV (symptoms similar to prior BV with lower abdominal pain, discharge, odor in vagina). Self pay and STD testing or self swab for BV/candida could be costly and we opted out as a result- could consider health department testing to save her money -postmenopausal so Upreg not obtained- last LMP 2009 before IUD inserted  Patient to follow up if new or worsening symptoms or failure to improve.   Recommended follow up: Return for as needed for new, worsening, persistent symptoms.  Lab/Order associations:   ICD-10-CM   1. Bacterial vaginosis  N76.0    B96.89     2. Pain with urination  R30.9 Urine Culture    POCT Urinalysis Dipstick (Automated)      Meds ordered this encounter  Medications   metroNIDAZOLE (FLAGYL) 500 MG tablet    Sig: Take 1 tablet (500 mg total) by mouth 2 (two) times daily. Do not use alcohol while taking this medication.    Dispense:  14 tablet    Refill:  0    Return precautions advised.  Garret Reddish, MD

## 2020-12-28 LAB — URINE CULTURE
MICRO NUMBER:: 12255064
SPECIMEN QUALITY:: ADEQUATE

## 2021-07-26 ENCOUNTER — Encounter: Payer: Self-pay | Admitting: Family

## 2021-07-26 ENCOUNTER — Ambulatory Visit (INDEPENDENT_AMBULATORY_CARE_PROVIDER_SITE_OTHER): Payer: Self-pay | Admitting: Family

## 2021-07-26 VITALS — BP 122/78 | HR 80 | Temp 97.9°F | Ht 60.0 in | Wt 94.0 lb

## 2021-07-26 DIAGNOSIS — J440 Chronic obstructive pulmonary disease with acute lower respiratory infection: Secondary | ICD-10-CM

## 2021-07-26 DIAGNOSIS — J209 Acute bronchitis, unspecified: Secondary | ICD-10-CM

## 2021-07-26 MED ORDER — AZITHROMYCIN 250 MG PO TABS: ORAL_TABLET | ORAL | 0 refills | Status: AC

## 2021-07-26 NOTE — Progress Notes (Signed)
? ?Subjective:  ? ? ? Patient ID: Cynthia Duran, female    DOB: 23-Jul-1965, 56 y.o.   MRN: [id number] ? ?Chief Complaint  ?Patient presents with  ? Cough  ?  Pt c/o cough with brown mucus, headaches and shortness of breath since 03/12.  ? Fatigue  ? ?HPI: ?Cough: Patient complains of nasal congestion, productive cough with sputum described as brown, rhinorrhea yellow, and wheezing.  Symptoms began 6 days ago.  The cough is with wheezing, with shortness of breath, with shortness of breath during the cough, chest is painful during coughing and is aggravated by infection and reclining position Associated symptoms include:change in voice and sputum production. Patient does not have a history of environmental allergens. Patient does have a history of smoking, currently. ? ?Health Maintenance Due  ?Topic Date Due  ? COVID-19 Vaccine (1) Never done  ? Pneumococcal Vaccine 37-62 Years old (1 - PCV) Never done  ? HIV Screening  Never done  ? Hepatitis C Screening  Never done  ? TETANUS/TDAP  Never done  ? MAMMOGRAM  03/03/2018  ? Zoster Vaccines- Shingrix (1 of 2) Never done  ? PAP SMEAR-Modifier  03/19/2020  ? ? ?Past Medical History:  ?Diagnosis Date  ? Arthritis Dx 2006  ? Frequent UTI   ? Knee joint disorder   ? reports gel injections in past  ? Pneumonia   ? Yeast infection   ? ? ?Past Surgical History:  ?Procedure Laterality Date  ? cyst removed Right 2009   ? elbow, Dr. Silvio Pate   ? LAPAROSCOPIC ABDOMINAL EXPLORATION    ? ? ?Outpatient Medications Prior to Visit  ?Medication Sig Dispense Refill  ? acetaminophen (TYLENOL) 325 MG tablet Take 650 mg by mouth every 6 (six) hours as needed.    ? albuterol (VENTOLIN HFA) 108 (90 Base) MCG/ACT inhaler Inhale 2 puffs into the lungs every 6 (six) hours as needed for wheezing or shortness of breath. Good rx 18 g 5  ? nicotine (NICODERM CQ - DOSED IN MG/24 HOURS) 21 mg/24hr patch Place 1 patch (21 mg total) onto the skin daily. 28 patch 5  ? metroNIDAZOLE (FLAGYL) 500 MG  tablet Take 1 tablet (500 mg total) by mouth 2 (two) times daily. Do not use alcohol while taking this medication. 14 tablet 0  ? oxyCODONE-acetaminophen (PERCOCET) 5-325 MG tablet Take 1 tablet by mouth every 8 (eight) hours as needed. 6 tablet 0  ? ?No facility-administered medications prior to visit.  ? ? ?Allergies  ?Allergen Reactions  ? Prednisone Other (See Comments)  ?  Pt reports "homicidal thoughts"  ? Penicillins Rash  ?  Has patient had a PCN reaction causing immediate rash, facial/tongue/throat swelling, SOB or lightheadedness with hypotension: Yes ?Has patient had a PCN reaction causing severe rash involving mucus membranes or skin necrosis: No ?Has patient had a PCN reaction that required hospitalization No ?Has patient had a PCN reaction occurring within the last 10 years: No ?If all of the above answers are "NO", then may proceed with Cephalosporin use. ?  ? ?   ?Objective:  ?  ?Physical Exam ?Vitals and nursing note reviewed.  ?Constitutional:   ?   Appearance: Normal appearance.  ?Cardiovascular:  ?   Rate and Rhythm: Normal rate and regular rhythm.  ?Pulmonary:  ?   Effort: Pulmonary effort is normal.  ?   Breath sounds: Rhonchi (mild, diffuse) present.  ?Musculoskeletal:     ?   General: Normal range of motion.  ?Skin: ?  General: Skin is warm and dry.  ?Neurological:  ?   Mental Status: She is alert.  ?Psychiatric:     ?   Mood and Affect: Mood normal.     ?   Behavior: Behavior normal.  ? ? ?BP 122/78 (BP Location: Left Arm, Patient Position: Sitting, Cuff Size: Normal)   Pulse 80   Temp 97.9 ?F (36.6 ?C) (Temporal)   Ht 5' (1.524 m)   Wt 94 lb (42.6 kg)   LMP 09/27/2007 (Exact Date)   SpO2 92%   BMI 18.36 kg/m?  ?Wt Readings from Last 3 Encounters:  ?07/26/21 94 lb (42.6 kg)  ?12/26/20 95 lb 9.6 oz (43.4 kg)  ?04/14/20 96 lb (43.5 kg)  ? ? ?   ?Assessment & Plan:  ? ?Problem List Items Addressed This Visit   ?None ?Visit Diagnoses   ? ? Acute bronchitis with COPD ([place])    -  Primary   ? pt coughing, fatigue for a week. pt smokes daily, COPD dx but can't afford inhalers, no insurance currently, no samples in office. Advised on smoking cessation & generic chantix, pt will consider, advised on increased water intake, can't tolerate prednisone, sending Zpack, advised on use & SE. ? ?Relevant Medications  ? azithromycin (ZITHROMAX) 250 MG tablet  ? ?  ? ?Meds ordered this encounter  ?Medications  ? azithromycin (ZITHROMAX) 250 MG tablet  ?  Sig: Take 2 tablets on day 1, then 1 tablet daily on days 2 through 5  ?  Dispense:  6 tablet  ?  Refill:  0  ?  Order Specific Question:   Supervising Provider  ?  Answer:   ANDY, CAMILLE L [[id number]]  ? ? ?Jeanie Sewer, NP ? ?

## 2021-07-26 NOTE — Patient Instructions (Addendum)
I have sent an antibiotic to your pharmacy, Zpack - take this for 5 days. ? ?Drink at least 2 liters of water daily to thin your mucus. ?OK to try a generic albuterol inhaler over the counter. Use 2 puffs every 4 hours as needed. ? ?Must stop smoking! As discussed, this is worsening your COPD which causes your fatigue and chronic infections. I would be glad to send generic Chantix to your pharmacy to try when you are ready.  See the handout sent to you in a separate message on other tips for smoking cessation. ? ?When you get insurance in July come back and we can see if a steroid inhaler would be covered for you to use daily which will help your breathing and fatigue. ?

## 2022-01-02 DIAGNOSIS — Z130 Encounter for screening for diseases of the blood and blood-forming organs and certain disorders involving the immune mechanism: Secondary | ICD-10-CM | POA: Diagnosis not present

## 2022-01-02 DIAGNOSIS — Z01419 Encounter for gynecological examination (general) (routine) without abnormal findings: Secondary | ICD-10-CM | POA: Diagnosis not present

## 2022-01-02 DIAGNOSIS — Z1151 Encounter for screening for human papillomavirus (HPV): Secondary | ICD-10-CM | POA: Diagnosis not present

## 2022-01-02 DIAGNOSIS — Z780 Asymptomatic menopausal state: Secondary | ICD-10-CM | POA: Diagnosis not present

## 2022-01-02 DIAGNOSIS — N760 Acute vaginitis: Secondary | ICD-10-CM | POA: Diagnosis not present

## 2022-01-02 DIAGNOSIS — N898 Other specified noninflammatory disorders of vagina: Secondary | ICD-10-CM | POA: Diagnosis not present

## 2022-01-02 DIAGNOSIS — R87810 Cervical high risk human papillomavirus (HPV) DNA test positive: Secondary | ICD-10-CM | POA: Diagnosis not present

## 2022-01-02 DIAGNOSIS — Z124 Encounter for screening for malignant neoplasm of cervix: Secondary | ICD-10-CM | POA: Diagnosis not present

## 2022-01-02 LAB — HM PAP SMEAR: HM Pap smear: POSITIVE

## 2022-02-03 ENCOUNTER — Encounter: Payer: Self-pay | Admitting: *Deleted

## 2022-04-10 ENCOUNTER — Ambulatory Visit (INDEPENDENT_AMBULATORY_CARE_PROVIDER_SITE_OTHER): Payer: BC Managed Care – PPO | Admitting: Family Medicine

## 2022-04-10 ENCOUNTER — Encounter: Payer: Self-pay | Admitting: Family Medicine

## 2022-04-10 VITALS — BP 124/80 | HR 67 | Temp 98.0°F | Ht 60.0 in | Wt 93.8 lb

## 2022-04-10 DIAGNOSIS — F17200 Nicotine dependence, unspecified, uncomplicated: Secondary | ICD-10-CM

## 2022-04-10 DIAGNOSIS — J449 Chronic obstructive pulmonary disease, unspecified: Secondary | ICD-10-CM | POA: Diagnosis not present

## 2022-04-10 DIAGNOSIS — R1900 Intra-abdominal and pelvic swelling, mass and lump, unspecified site: Secondary | ICD-10-CM | POA: Diagnosis not present

## 2022-04-10 MED ORDER — ANORO ELLIPTA 62.5-25 MCG/ACT IN AEPB: 1.0000 | INHALATION_SPRAY | Freq: Every day | RESPIRATORY_TRACT | 5 refills | Status: DC

## 2022-04-10 MED ORDER — ALBUTEROL SULFATE HFA 108 (90 BASE) MCG/ACT IN AERS: 2.0000 | INHALATION_SPRAY | Freq: Four times a day (QID) | RESPIRATORY_TRACT | 5 refills | Status: DC | PRN

## 2022-04-10 NOTE — Progress Notes (Signed)
Phone [phone] In person visit   Subjective:   Cynthia Duran is a 56 y.o. year old very pleasant female patient who presents for/with See problem oriented charting Chief Complaint  Patient presents with   Cough    Pt states its been a while and didn't have insurance to be seen   COPD    Pt states she has not got covid test   Past Medical History-  Patient Active Problem List   Diagnosis Date Noted   COPD (chronic obstructive pulmonary disease) ([place]) 09/13/2015    Priority: High   Smoker 03/27/2014    Priority: High   Abdominal bloating 03/20/2017    Priority: Medium    Family history of early CAD 04/02/2016    Priority: Medium    Hypersomnia 09/14/2015    Priority: Low   Chronic fatigue 05/24/2015    Priority: Low   Osteoarthritis 09/19/2014    Priority: Low   Low back pain 03/27/2014    Priority: Low   Early menopause 03/27/2014    Priority: Low    Medications- reviewed and updated Current Outpatient Medications  Medication Sig Dispense Refill   albuterol (VENTOLIN HFA) 108 (90 Base) MCG/ACT inhaler Inhale 2 puffs into the lungs every 6 (six) hours as needed for wheezing or shortness of breath. 1 each 5   Bacillus Coagulans-Inulin (ALIGN PREBIOTIC-PROBIOTIC PO) Take by mouth.     Biotin 10000 MCG TABS Take by mouth.     umeclidinium-vilanterol (ANORO ELLIPTA) 62.5-25 MCG/ACT AEPB Inhale 1 puff into the lungs daily. 60 each 5   No current facility-administered medications for this visit.     Objective:  BP 124/80 (BP Location: Left Arm, Patient Position: Sitting)   Pulse 67   Temp 98 F (36.7 C) (Temporal)   Ht 5' (1.524 m)   Wt 93 lb 12.8 oz (42.5 kg)   LMP 09/27/2007 (Exact Date)   SpO2 95%   BMI 18.32 kg/m  Gen: NAD, resting comfortably, thin CV: RRR no murmurs rubs or gallops Lungs: CTAB no crackles, wheeze, rhonchi-prolonged expiratory phase Abdomen: soft/nontender/nondistended/normal bowel sounds. No rebound or guarding.  -She is unable to  reproduce abdominal bulge today Ext: no edema Skin: warm, dry     Assessment and Plan   #social update- son and daughter now living with their father (73 and 71 years old). She got married in July 2023. Lilly turned  89.  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 Sometimes affects her with work.  Sometimes with cough gets upper abdominal hernia?- able to push it back down Maintenance medications:  anoro in past helpful but cost prohibitive when lost insurance.  -issues with albuterol   Patient has had to use albuterol in past but has been out   Trying to quit smoking but got severe dreams on chantix in past. Wants to trial hypnosis- gave info for [employer]. With patches increased intake A/P: COPD poorly controlled off all medications.  Restart Anoro which was helpful in the past and encouraged regular use as.  Discussed albuterol can be used as a rescue inhaler -Asked her to update me in a month and if not substantially improved consider pulmonology consult -No exertional chest pain reported-do not think we need to pursue cardiac workup aggressively unless fails to improve  Long-term smoker willing to do lung cancer screening program-referred today - Encourage smoking cessation-she  would like to trial hypnosis -Does not tolerate Chantix due to extremely bad dreams and actually smokes more on nicotine patches  I could not feel abdominal bulge/potential epigastric hernia -possible ct if not mentioned on lung cancer screening but we opted to start here  # Low weight/low BMI-encouraged mild weight gain  Recommended follow up: Return in about 6 months (around 10/09/2022) for physical or sooner if needed.Schedule b4 you leave. -We need to update baseline blood work at that time  Lab/Order associations:   ICD-10-CM   1. Chronic obstructive pulmonary disease,  unspecified COPD type ([place])  J44.9     2. Smoker  F17.200 Ambulatory Referral for Lung Cancer Scre    3. Abdominal wall bulge  R19.00       Meds ordered this encounter  Medications   umeclidinium-vilanterol (ANORO ELLIPTA) 62.5-25 MCG/ACT AEPB    Sig: Inhale 1 puff into the lungs daily.    Dispense:  60 each    Refill:  5   albuterol (VENTOLIN HFA) 108 (90 Base) MCG/ACT inhaler    Sig: Inhale 2 puffs into the lungs every 6 (six) hours as needed for wheezing or shortness of breath.    Dispense:  1 each    Refill:  5    Return precautions advised.  Garret Reddish, MD

## 2022-04-10 NOTE — Patient Instructions (Addendum)
Recommend Tdap- you wanted to hold off  Restart anoro daily- sent this in- update me if not improving from breathing perspective within a month-  -albuterol is your back up rescue inhaler- I would want to know when you call or message [place] how often you are needing that -if you are not improving would refer to pulmonary  Sign release of information at the check out desk for last pap smear copy ONLY- do not need office notes  We will call you within two weeks about your referral to lung cancer screening program. If you do not hear within 2 weeks, give [place] a call.   Willing to see sister and brother in law- can mention this at desk   Love that you are desiring to quit smoking! You can do this!   Recommended follow up: Return in about 6 months (around 10/09/2022) for physical or sooner if needed.Schedule b4 you leave.

## 2022-04-29 ENCOUNTER — Other Ambulatory Visit: Payer: Self-pay

## 2022-04-29 DIAGNOSIS — F17210 Nicotine dependence, cigarettes, uncomplicated: Secondary | ICD-10-CM

## 2022-04-29 DIAGNOSIS — Z87891 Personal history of nicotine dependence: Secondary | ICD-10-CM

## 2022-04-29 DIAGNOSIS — Z122 Encounter for screening for malignant neoplasm of respiratory organs: Secondary | ICD-10-CM

## 2022-06-10 ENCOUNTER — Encounter: Payer: Self-pay | Admitting: Acute Care

## 2022-06-10 ENCOUNTER — Ambulatory Visit (INDEPENDENT_AMBULATORY_CARE_PROVIDER_SITE_OTHER): Payer: BC Managed Care – PPO | Admitting: Acute Care

## 2022-06-10 DIAGNOSIS — F17210 Nicotine dependence, cigarettes, uncomplicated: Secondary | ICD-10-CM | POA: Diagnosis not present

## 2022-06-10 NOTE — Patient Instructions (Signed)
Thank you for participating in the [facility]. It was our pleasure to meet you today. We will call you with the results of your scan within the next few days. Your scan will be assigned a Lung RADS category score by the physicians reading the scans.  This Lung RADS score determines follow up scanning.  See below for description of categories, and follow up screening recommendations. We will be in touch to schedule your follow up screening annually or based on recommendations of our providers. We will fax a copy of your scan results to your Primary Care Physician, or the physician who referred you to the program, to ensure they have the results. Please call the office if you have any questions or concerns regarding your scanning experience or results.  Our office number is [phone]. Please speak with Denise Phelps, RN. , or  Denise Buckner RN, They are  our Lung Cancer Screening RN.'s If They are unavailable when you call, Please leave a message on the voice mail. We will return your call at our earliest convenience.This voice mail is monitored several times a day.  Remember, if your scan is normal, we will scan you annually as long as you continue to meet the criteria for the program. (Age 50-80, Current smoker or smoker who has quit within the last 15 years). If you are a smoker, remember, quitting is the single most powerful action that you can take to decrease your risk of lung cancer and other pulmonary, breathing related problems. We know quitting is hard, and we are here to help.  Please let us know if there is anything we can do to help you meet your goal of quitting. If you are a former smoker, congratulations. We are proud of you! Remain smoke free! Remember you can refer friends or family members through the number above.  We will screen them to make sure they meet criteria for the program. Thank you for helping us take better care of you by  participating in Lung Screening.  You can receive free nicotine replacement therapy ( patches, gum or mints) by calling 1-800-QUIT NOW. Please call so we can get you on the path to becoming  a non-smoker. I know it is hard, but you can do this!  Lung RADS Categories:  Lung RADS 1: no nodules or definitely non-concerning nodules.  Recommendation is for a repeat annual scan in 12 months.  Lung RADS 2:  nodules that are non-concerning in appearance and behavior with a very low likelihood of becoming an active cancer. Recommendation is for a repeat annual scan in 12 months.  Lung RADS 3: nodules that are probably non-concerning , includes nodules with a low likelihood of becoming an active cancer.  Recommendation is for a 6-month repeat screening scan. Often noted after an upper respiratory illness. We will be in touch to make sure you have no questions, and to schedule your 6-month scan.  Lung RADS 4 A: nodules with concerning findings, recommendation is most often for a follow up scan in 3 months or additional testing based on our provider's assessment of the scan. We will be in touch to make sure you have no questions and to schedule the recommended 3 month follow up scan.  Lung RADS 4 B:  indicates findings that are concerning. We will be in touch with you to schedule additional diagnostic testing based on our provider's  assessment of the scan.  Other options for assistance in smoking cessation (   As covered by your insurance benefits)  Hypnosis for smoking cessation  [employer]. [phone]  Acupuncture for smoking cessation  [facility] [phone]

## 2022-06-10 NOTE — Progress Notes (Signed)
Virtual Visit via Telephone Note  I connected with Rolene Course on 06/10/22 at  3:30 PM EST by telephone and verified that I am speaking with the correct person using two identifiers.  Location: Patient: At home Provider: .[address], [place], [place], [address]    I discussed the limitations, risks, security and privacy concerns of performing an evaluation and management service by telephone and the availability of in person appointments. I also discussed with the patient that there may be a patient responsible charge related to this service. The patient expressed understanding and agreed to proceed.  Shared Decision Making Visit Lung Cancer Screening Program [phone])   Eligibility: Age 57 y.o. Pack Years Smoking History Calculation 63 pack year smoking history (# packs/per year x # years smoked) Recent History of coughing up blood  no Unexplained weight loss? no ( >Than 15 pounds within the last 6 months ) Prior History Lung / other cancer no (Diagnosis within the last 5 years already requiring surveillance chest CT Scans). Smoking Status Current Smoker Former Smokers: Years since quit: NA  Quit Date: NA  Visit Components: Discussion included one or more decision making aids. yes Discussion included risk/benefits of screening. yes Discussion included potential follow up diagnostic testing for abnormal scans. yes Discussion included meaning and risk of over diagnosis. yes Discussion included meaning and risk of False Positives. yes Discussion included meaning of total radiation exposure. yes  Counseling Included: Importance of adherence to annual lung cancer LDCT screening. yes Impact of comorbidities on ability to participate in the program. yes Ability and willingness to under diagnostic treatment. yes  Smoking Cessation Counseling: Current Smokers:  Discussed importance of smoking cessation. yes Information about tobacco cessation classes and interventions  provided to patient. yes Patient provided with "ticket" for LDCT Scan. yes Symptomatic Patient. no  Counseling NA Diagnosis Code: Tobacco Use Z72.0 Asymptomatic Patient yes  Counseling (Intermediate counseling: > three minutes counseling) [id number] Former Smokers:  Discussed the importance of maintaining cigarette abstinence. yes Diagnosis Code: Personal History of Nicotine Dependence. U11.031 Information about tobacco cessation classes and interventions provided to patient. Yes Patient provided with "ticket" for LDCT Scan. yes Written Order for Lung Cancer Screening with LDCT placed in Epic. Yes (CT Chest Lung Cancer Screening Low Dose W/O CM) [id number] Z12.2-Screening of respiratory organs Z87.891-Personal history of nicotine dependence  I have spent 25 minutes of face to face/ virtual visit   time with  Ms. Schmeling discussing the risks and benefits of lung cancer screening. We viewed / discussed a power point together that explained in detail the above noted topics. We paused at intervals to allow for questions to be asked and answered to ensure understanding.We discussed that the single most powerful action that she can take to decrease her risk of developing lung cancer is to quit smoking. We discussed whether or not she is ready to commit to setting a quit date. We discussed options for tools to aid in quitting smoking including nicotine replacement therapy, non-nicotine medications, support groups, Quit Smart classes, and behavior modification. We discussed that often times setting smaller, more achievable goals, such as eliminating 1 cigarette a day for a week and then 2 cigarettes a day for a week can be helpful in slowly decreasing the number of cigarettes smoked. This allows for a sense of accomplishment as well as providing a clinical benefit. I provided  her  with smoking cessation  information  with contact information for community resources, classes, free nicotine replacement therapy,  and  access to mobile apps, text messaging, and on-line smoking cessation help. I have also provided  her  the office contact information in the event she needs to contact me, or the screening staff. We discussed the time and location of the scan, and that either Doroteo Glassman RN, Joella Prince, RN  or I will call / send a letter with the results within 24-72 hours of receiving them. The patient verbalized understanding of all of  the above and had no further questions upon leaving the office. They have my contact information in the event they have any further questions.  I spent 5 minutes counseling on smoking cessation and the health risks of continued tobacco abuse.  I explained to the patient that there has been a high incidence of coronary artery disease noted on these exams. I explained that this is a non-gated exam therefore degree or severity cannot be determined. This patient is not on statin therapy. I have asked the patient to follow-up with their PCP regarding any incidental finding of coronary artery disease and management with diet or medication as their PCP  feels is clinically indicated. The patient verbalized understanding of the above and had no further questions upon completion of the visit.      Magdalen Spatz, NP 06/10/2022

## 2022-06-11 ENCOUNTER — Ambulatory Visit
Admission: RE | Admit: 2022-06-11 | Discharge: 2022-06-11 | Disposition: A | Discharge status: Home / Self Care | Payer: BC Managed Care – PPO | Source: Ambulatory Visit

## 2022-06-11 DIAGNOSIS — F17210 Nicotine dependence, cigarettes, uncomplicated: Secondary | ICD-10-CM | POA: Diagnosis not present

## 2022-06-11 DIAGNOSIS — Z87891 Personal history of nicotine dependence: Secondary | ICD-10-CM

## 2022-06-11 DIAGNOSIS — Z122 Encounter for screening for malignant neoplasm of respiratory organs: Secondary | ICD-10-CM

## 2022-06-12 ENCOUNTER — Other Ambulatory Visit: Payer: Self-pay | Admitting: Acute Care

## 2022-06-12 DIAGNOSIS — Z122 Encounter for screening for malignant neoplasm of respiratory organs: Secondary | ICD-10-CM

## 2022-06-12 DIAGNOSIS — Z87891 Personal history of nicotine dependence: Secondary | ICD-10-CM

## 2022-06-12 DIAGNOSIS — F17210 Nicotine dependence, cigarettes, uncomplicated: Secondary | ICD-10-CM

## 2022-06-13 ENCOUNTER — Encounter: Payer: Self-pay | Admitting: Family Medicine

## 2022-06-13 DIAGNOSIS — I700 Atherosclerosis of aorta: Secondary | ICD-10-CM | POA: Insufficient documentation

## 2022-10-01 ENCOUNTER — Encounter: Payer: BC Managed Care – PPO | Admitting: Family Medicine

## 2022-10-15 ENCOUNTER — Encounter: Payer: Self-pay | Admitting: Family Medicine

## 2023-01-06 DIAGNOSIS — Z01419 Encounter for gynecological examination (general) (routine) without abnormal findings: Secondary | ICD-10-CM | POA: Diagnosis not present

## 2023-01-06 DIAGNOSIS — R102 Pelvic and perineal pain: Secondary | ICD-10-CM | POA: Diagnosis not present

## 2023-01-06 DIAGNOSIS — R87810 Cervical high risk human papillomavirus (HPV) DNA test positive: Secondary | ICD-10-CM | POA: Diagnosis not present

## 2023-01-06 DIAGNOSIS — Z01411 Encounter for gynecological examination (general) (routine) with abnormal findings: Secondary | ICD-10-CM | POA: Diagnosis not present

## 2023-01-06 DIAGNOSIS — N9419 Other specified dyspareunia: Secondary | ICD-10-CM | POA: Diagnosis not present

## 2023-01-06 DIAGNOSIS — R6882 Decreased libido: Secondary | ICD-10-CM | POA: Diagnosis not present

## 2023-01-07 DIAGNOSIS — R3121 Asymptomatic microscopic hematuria: Secondary | ICD-10-CM | POA: Diagnosis not present

## 2023-01-22 DIAGNOSIS — R87618 Other abnormal cytological findings on specimens from cervix uteri: Secondary | ICD-10-CM | POA: Diagnosis not present

## 2023-01-22 DIAGNOSIS — N870 Mild cervical dysplasia: Secondary | ICD-10-CM | POA: Diagnosis not present

## 2023-02-08 ENCOUNTER — Other Ambulatory Visit: Payer: Self-pay

## 2023-02-08 ENCOUNTER — Emergency Department (HOSPITAL_BASED_OUTPATIENT_CLINIC_OR_DEPARTMENT_OTHER): Payer: BC Managed Care – PPO | Admitting: Radiology

## 2023-02-08 ENCOUNTER — Emergency Department (HOSPITAL_BASED_OUTPATIENT_CLINIC_OR_DEPARTMENT_OTHER)
Admission: EM | Admit: 2023-02-08 | Discharge: 2023-02-08 | Disposition: A | Discharge status: Home / Self Care | Payer: BC Managed Care – PPO | Attending: Emergency Medicine | Admitting: Emergency Medicine

## 2023-02-08 ENCOUNTER — Encounter (HOSPITAL_BASED_OUTPATIENT_CLINIC_OR_DEPARTMENT_OTHER): Payer: Self-pay | Admitting: Emergency Medicine

## 2023-02-08 DIAGNOSIS — R0602 Shortness of breath: Secondary | ICD-10-CM | POA: Diagnosis not present

## 2023-02-08 DIAGNOSIS — J449 Chronic obstructive pulmonary disease, unspecified: Secondary | ICD-10-CM | POA: Diagnosis not present

## 2023-02-08 DIAGNOSIS — R059 Cough, unspecified: Secondary | ICD-10-CM | POA: Insufficient documentation

## 2023-02-08 DIAGNOSIS — R062 Wheezing: Secondary | ICD-10-CM | POA: Diagnosis not present

## 2023-02-08 DIAGNOSIS — R509 Fever, unspecified: Secondary | ICD-10-CM | POA: Insufficient documentation

## 2023-02-08 DIAGNOSIS — Z20822 Contact with and (suspected) exposure to covid-19: Secondary | ICD-10-CM | POA: Insufficient documentation

## 2023-02-08 DIAGNOSIS — J441 Chronic obstructive pulmonary disease with (acute) exacerbation: Secondary | ICD-10-CM

## 2023-02-08 DIAGNOSIS — J439 Emphysema, unspecified: Secondary | ICD-10-CM | POA: Diagnosis not present

## 2023-02-08 LAB — TROPONIN I (HIGH SENSITIVITY): Troponin I (High Sensitivity): 3 ng/L (ref <18)

## 2023-02-08 LAB — RESP PANEL BY RT-PCR (RSV, FLU A&B, COVID)  RVPGX2
Influenza A by PCR: NEGATIVE
Influenza B by PCR: NEGATIVE
Resp Syncytial Virus by PCR: NEGATIVE
SARS Coronavirus 2 by RT PCR: NEGATIVE

## 2023-02-08 LAB — COMPREHENSIVE METABOLIC PANEL
ALT: 10 U/L (ref 0–44)
AST: 16 U/L (ref 15–41)
Albumin: 4.1 g/dL (ref 3.5–5.0)
Alkaline Phosphatase: 64 U/L (ref 38–126)
Anion gap: 9 (ref 5–15)
BUN: 7 mg/dL (ref 6–20)
CO2: 25 mmol/L (ref 22–32)
Calcium: 8.8 mg/dL — ABNORMAL LOW (ref 8.9–10.3)
Chloride: 107 mmol/L (ref 98–111)
Creatinine, Ser: 0.74 mg/dL (ref 0.44–1.00)
GFR, Estimated: >60 mL/min (ref >60)
Glucose, Bld: 102 mg/dL — ABNORMAL HIGH (ref 70–99)
Potassium: 4 mmol/L (ref 3.5–5.1)
Sodium: 141 mmol/L (ref 135–145)
Total Bilirubin: 0.3 mg/dL (ref 0.3–1.2)
Total Protein: 6.6 g/dL (ref 6.5–8.1)

## 2023-02-08 LAB — CBC WITH DIFFERENTIAL/PLATELET
Abs Immature Granulocytes: 0.02 10*3/uL (ref 0.00–0.07)
Basophils Absolute: 0.1 10*3/uL (ref 0.0–0.1)
Basophils Relative: 1 %
Eosinophils Absolute: 0.2 10*3/uL (ref 0.0–0.5)
Eosinophils Relative: 3 %
HCT: 43.4 % (ref 36.0–46.0)
Hemoglobin: 15.1 g/dL — ABNORMAL HIGH (ref 12.0–15.0)
Immature Granulocytes: 0 %
Lymphocytes Relative: 36 %
Lymphs Abs: 2.8 10*3/uL (ref 0.7–4.0)
MCH: 31.5 pg (ref 26.0–34.0)
MCHC: 34.8 g/dL (ref 30.0–36.0)
MCV: 90.6 fL (ref 80.0–100.0)
Monocytes Absolute: 0.7 10*3/uL (ref 0.1–1.0)
Monocytes Relative: 9 %
Neutro Abs: 3.9 10*3/uL (ref 1.7–7.7)
Neutrophils Relative %: 51 %
Platelets: 205 10*3/uL (ref 150–400)
RBC: 4.79 MIL/uL (ref 3.87–5.11)
RDW: 12.7 % (ref 11.5–15.5)
WBC: 7.8 10*3/uL (ref 4.0–10.5)
nRBC: 0 % (ref 0.0–0.2)

## 2023-02-08 MED ORDER — ALBUTEROL (5 MG/ML) CONTINUOUS INHALATION SOLN: 10.0000 mg/h | INHALATION_SOLUTION | Freq: Once | RESPIRATORY_TRACT | Status: DC

## 2023-02-08 MED ORDER — IPRATROPIUM-ALBUTEROL 0.5-2.5 (3) MG/3ML IN SOLN: 3.0000 mL | Freq: Once | RESPIRATORY_TRACT | Status: AC

## 2023-02-08 MED ORDER — IPRATROPIUM-ALBUTEROL 0.5-2.5 (3) MG/3ML IN SOLN
6.0000 mL | Freq: Once | RESPIRATORY_TRACT | Status: AC
  Administered 2023-02-08: 6 mL via RESPIRATORY_TRACT
  Filled 2023-02-08: qty 3

## 2023-02-08 MED ORDER — METHYLPREDNISOLONE SODIUM SUCC 125 MG IJ SOLR
125.0000 mg | Freq: Once | INTRAMUSCULAR | Status: AC
  Administered 2023-02-08: 125 mg via INTRAVENOUS
  Filled 2023-02-08: qty 2

## 2023-02-08 MED ORDER — PREDNISONE 10 MG PO TABS
ORAL_TABLET | ORAL | 0 refills | Status: AC
Start: 2023-02-08 — End: 2023-02-13

## 2023-02-08 MED ORDER — IPRATROPIUM-ALBUTEROL 0.5-2.5 (3) MG/3ML IN SOLN
RESPIRATORY_TRACT | Status: AC
  Administered 2023-02-08: 3 mL via RESPIRATORY_TRACT
  Filled 2023-02-08: qty 3

## 2023-02-08 MED ORDER — ALBUTEROL SULFATE (2.5 MG/3ML) 0.083% IN NEBU
10.0000 mg | INHALATION_SOLUTION | Freq: Once | RESPIRATORY_TRACT | Status: AC
  Administered 2023-02-08: 10 mg via RESPIRATORY_TRACT
  Filled 2023-02-08: qty 12

## 2023-02-08 MED ORDER — MAGNESIUM SULFATE 2 GM/50ML IV SOLN
2.0000 g | Freq: Once | INTRAVENOUS | Status: AC
  Administered 2023-02-08: 2 g via INTRAVENOUS
  Filled 2023-02-08: qty 50

## 2023-02-08 NOTE — ED Triage Notes (Signed)
Cough, sob, tachypnea Started last Sunday Hx copd

## 2023-02-08 NOTE — ED Notes (Signed)
Pt maintained 93% RA while ambulating. Marchelle Folks PA made aware

## 2023-02-08 NOTE — ED Provider Notes (Signed)
Continuous neb then recheck. SOB 4 days, cough. Needs to walk and check o2 stats Physical Exam  BP 116/77   Pulse 84   Temp 98.2 F (36.8 C)   Resp (!) 21   Ht 5' (1.524 m)   Wt 42.2 kg   LMP 09/27/2007 (Exact Date)   SpO2 91%   BMI 18.16 kg/m   Physical Exam Vitals and nursing note reviewed.  Constitutional:      Appearance: Normal appearance.     Comments: Well-appearing, talking in full sentences without any difficulty  HENT:     Head: Atraumatic.  Cardiovascular:     Rate and Rhythm: Normal rate and regular rhythm.  Pulmonary:     Effort: Pulmonary effort is normal.     Breath sounds: Normal breath sounds.     Comments: lungs clear to auscultation bilaterally Neurological:     General: No focal deficit present.     Mental Status: She is alert.  Psychiatric:        Mood and Affect: Mood normal.        Behavior: Behavior normal.     Procedures  Procedures  ED Course / MDM    Medical Decision Making Amount and/or Complexity of Data Reviewed Labs: ordered. Radiology: ordered.  Risk Prescription drug management.  In short, this is a 57 year old female with history of COPD and tobacco use  (2PPD) who presented for shortness of breath that has been worsening for the past 4 days.  Reports some nasal drainage and for couple days as well.  I received this patient as handoff from Kandice Robinsons, PA, who started patient on treatment with 3 DuoNeb inhalers, Magnesium, and a continuous albuterol nebulizer.  Also given 125 mg Solu-Medrol.  Her initial troponin is 3, no chest pain, low suspicion for ACS.  CBC unremarkable.  COVID, flu, RSV negative.  Suspect her rhinorrhea and cough may be secondary to URI.  Chest x-ray with hyperinflation consistent with emphysema, but no acute abnormalities.  Suspect this is COPD exacerbation.  Upon walk test after nebulizer treatment, patient maintained 93% oxygen saturation which is consistent with her baseline oxygen saturation with COPD.   Appropriate for discharge home at this time.   Will discharge with short course of prednisone.  I talked her about following up with a PCP regarding COPD management within the next week.  Return precautions given.       Arabella Merles, PA-C 02/08/23 2225    Royanne Foots, DO 02/10/23 1447

## 2023-02-08 NOTE — ED Provider Notes (Signed)
[place] EMERGENCY DEPARTMENT AT [facility] Provider Note   CSN: [id number] Arrival date & time: 02/08/23  1600     History Chief Complaint  Patient presents with   Shortness of Breath    Cynthia Duran is a 57 y.o. female with history of COPD and tobacco use presents the emerged from today for evaluation of shortness of breath worsening for the past 4 days.  She reports that she does smoke 2 packs of cigarettes per day.  She uses the Anora daily inhaler.  She reports that she never uses her albuterol inhaler but has not used it twice over these past few days and has not found any significant improvement.  She denies any chest pain.  She reports that she is also been having a cough clear/white sputum.  Reports a possible a temperature of 100 Fahrenheit a few days prior.  Reports some rhinorrhea and some nasal gesturing.  Reports has been trying some Delsym which she finds some relief however it is causing her to stay up at night.  She thinks that all the symptoms started after she went to a concert last weekend as well.  No recent surgeries.  She denies any leg swelling.  Denies any heart palpitations.  She is allergic to penicillin.  She reports that she does not want to be given prednisone.  She is a 80 pack/year history.  Denies any alcohol use.  Denies any drugs.   Shortness of Breath Associated symptoms: cough, fever and wheezing   Associated symptoms: no chest pain and no vomiting        Home Medications Prior to Admission medications   Medication Sig Start Date End Date Taking? Authorizing Provider  albuterol (VENTOLIN HFA) 108 (90 Base) MCG/ACT inhaler Inhale 2 puffs into the lungs every 6 (six) hours as needed for wheezing or shortness of breath. 04/10/22   Shelva Majestic, MD  Bacillus Coagulans-Inulin (ALIGN PREBIOTIC-PROBIOTIC PO) Take by mouth.    [provider]  Biotin 28315 MCG TABS Take by mouth.    [provider]   umeclidinium-vilanterol (ANORO ELLIPTA) 62.5-25 MCG/ACT AEPB Inhale 1 puff into the lungs daily. 04/10/22   Shelva Majestic, MD      Allergies    Prednisone and Penicillins    Review of Systems   Review of Systems  Constitutional:  Positive for chills and fever.  HENT:  Positive for congestion and rhinorrhea.   Respiratory:  Positive for cough, shortness of breath and wheezing.   Cardiovascular:  Negative for chest pain, palpitations and leg swelling.  Gastrointestinal:  Negative for nausea and vomiting.  Genitourinary:  Negative for dysuria and hematuria.    Physical Exam Updated Vital Signs BP 116/77   Pulse 84   Temp 98.2 F (36.8 C)   Resp (!) 21   Ht 5' (1.524 m)   Wt 42.2 kg   LMP 09/27/2007 (Exact Date)   SpO2 91%   BMI 18.16 kg/m  Physical Exam Vitals and nursing note reviewed.  Constitutional:      General: She is not in acute distress. Cardiovascular:     Rate and Rhythm: Normal rate.  Pulmonary:     Effort: No respiratory distress.     Breath sounds: Decreased breath sounds and wheezing present.     Comments: Patient is unable to finish a full sentence without any intake of breath however she does not appear to be in any acute respiratory distress.  Respiratory rate between 20 and 22  satting in the low 90s.  She is not using any accessory muscle use or any tripoding.  No cyanosis.  She has a prolonged expiratory phase with coarse expiratory wheezing in bilateral lung fields.  Worse at the bases. Musculoskeletal:     Right lower leg: No tenderness. No edema.     Left lower leg: No tenderness. No edema.  Skin:    General: Skin is warm and dry.  Neurological:     Mental Status: She is alert.     ED Results / Procedures / Treatments   Labs (all labs ordered are listed, but only abnormal results are displayed) Labs Reviewed  CBC WITH DIFFERENTIAL/PLATELET - Abnormal; Notable for the following components:      Result Value   Hemoglobin 15.1 (*)    All  other components within normal limits  COMPREHENSIVE METABOLIC PANEL - Abnormal; Notable for the following components:   Glucose, Bld 102 (*)    Calcium 8.8 (*)    All other components within normal limits  RESP PANEL BY RT-PCR (RSV, FLU A&B, COVID)  RVPGX2  TROPONIN I (HIGH SENSITIVITY)  TROPONIN I (HIGH SENSITIVITY)    EKG None  Radiology DG Chest 2 View  Result Date: 02/08/2023 CLINICAL DATA:  Shortness of breath, cough EXAM: CHEST - 2 VIEW COMPARISON:  04/01/2016 FINDINGS: The heart size and mediastinal contours are within normal limits. Emphysema and pulmonary hyperinflation. Disc degenerative disease of the thoracic spine. IMPRESSION: Emphysema and pulmonary hyperinflation without acute abnormality of the lungs. Electronically Signed   By: Jearld Lesch M.D.   On: 02/08/2023 17:09    Procedures Procedures   Medications Ordered in ED Medications  ipratropium-albuterol (DUONEB) 0.5-2.5 (3) MG/3ML nebulizer solution 3 mL (3 mLs Nebulization Given 02/08/23 1638)  ipratropium-albuterol (DUONEB) 0.5-2.5 (3) MG/3ML nebulizer solution 6 mL (6 mLs Nebulization Given 02/08/23 1705)  magnesium sulfate IVPB 2 g 50 mL (0 g Intravenous Stopped 02/08/23 1837)  methylPREDNISolone sodium succinate (SOLU-MEDROL) 125 mg/2 mL injection 125 mg (125 mg Intravenous Given 02/08/23 1819)    ED Course/ Medical Decision Making/ A&P   Medical Decision Making Amount and/or Complexity of Data Reviewed Labs: ordered. Radiology: ordered.  Risk Prescription drug management.   57 y.o. female presents to the ER for evaluation of SOB. Differential diagnosis includes but is not limited to CHF, pericardial effusion/tamponade, arrhythmias, ACS, COPD, asthma, bronchitis, pneumonia, pneumothorax, PE, anemia. Vital signs borderline hypoxia at 92%, otherwise unremarkable . Physical exam as noted above.   The patient is able to speak in a full sentence without need to get breath however she is not having any  significant respiratory distress, nasal flaring, cyanosis, or tripoding.  She is satting in the low 90s on room air.  I independently reviewed and interpreted the patient's labs.  Troponin at 3, respirate panel negative for COVID, flu, RSV.  CMP does show mildly of a glucose of 102.  Calcium at 8.8.  Otherwise, no electrolyte or LFT abnormality.  CBC does show slightly increased hemoglobin of 15.1, could likely be due to chronic smoking.  No leukocytosis.  Chest x-ray shows emphysema and pulmonary hyperinflation without acute abnormality of the lungs per radiologist's read.   EKG reviewed and interpreted by my attending and read as sinus rhythm RSR' in V1 or V2, right VCD or RVH.  The patient to DuoNebs and her lung sounds are improved some however she still does have prolonged expiratory phase with coarse wheezing.  I discussed with her about the Solu-Medrol and  she is willing to do a dose.  I have also given her some magnesium as well.  On reevaluation, patient still having prolonged expiratory phase with expiratory wheezing.  Will put her on a continuous neb and have her do ambulatory pulse ox symmetry to check for desaturation.  Questioning COPD exacerbation secondary to upper respiratory infection/viral illness.  Will handoff to oncoming shift to reevaluate.  7:42 PM Care of Cynthia Duran transferred to [facility] at the end of my shift as the patient will require reassessment once labs/imaging have resulted. Patient presentation, ED course, and plan of care discussed with review of all pertinent labs and imaging. Please see his/her note for further details regarding further ED course and disposition. Plan at time of handoff is follow up on ambulatory pulse ox and re-evaluate after continuous neb. This may be altered or completely changed at the discretion of the oncoming team pending results of further workup.  Portions of this report may have been transcribed using voice recognition  software. Every effort was made to ensure accuracy; however, inadvertent computerized transcription errors may be present.   Final Clinical Impression(s) / ED Diagnoses Final diagnoses:  None    Rx / DC Orders ED Discharge Orders     None         Achille Rich, PA-C 02/08/23 2024    Royanne Foots, DO 02/10/23 1444

## 2023-02-08 NOTE — ED Notes (Signed)
 RN reviewed discharge instructions with pt. Pt verbalized understanding and had no further questions. VSS upon discharge.  

## 2023-02-08 NOTE — Discharge Instructions (Addendum)
You have been prescribed prednisone.  Please take this in the taper format as prescribed.  I recommend taking it first thing in the morning as it can make it more difficult to sleep at night.  Please follow-up with your primary care provider within the next week for follow-up and further management of your COPD.  Return to the ER for any shortness of breath where it is difficulty talking full sentences, chest pain, any other new or concerning symptoms

## 2023-02-26 ENCOUNTER — Encounter: Payer: Self-pay | Admitting: Family Medicine

## 2023-02-26 ENCOUNTER — Other Ambulatory Visit: Payer: Self-pay

## 2023-02-26 DIAGNOSIS — M1990 Unspecified osteoarthritis, unspecified site: Secondary | ICD-10-CM

## 2023-02-27 ENCOUNTER — Other Ambulatory Visit: Payer: Self-pay

## 2023-02-27 DIAGNOSIS — J449 Chronic obstructive pulmonary disease, unspecified: Secondary | ICD-10-CM

## 2023-03-13 ENCOUNTER — Telehealth: Payer: Self-pay

## 2023-03-13 NOTE — Telephone Encounter (Signed)
Transition Care Management Unsuccessful Follow-up Telephone Call  Date of discharge and from where:  02/08/2023 [facility]  Attempts:  1st Attempt  Reason for unsuccessful TCM follow-up call:  No answer/busy  Yeimi Debnam [facility]  [facility], [facility] Guide Direct Dial: [phone]  Website: [email or web address]

## 2023-03-16 ENCOUNTER — Telehealth: Payer: Self-pay

## 2023-03-16 NOTE — Telephone Encounter (Signed)
Transition Care Management Follow-up Telephone Call Date of discharge and from where: 02/08/2023 [facility] How have you been since you were released from the hospital? Patient stated she is feeling much better and has not smoked a cigarette since 03/04/23. Any questions or concerns? No  Items Reviewed: Did the pt receive and understand the discharge instructions provided? Yes  Medications obtained and verified? Yes  Other? No  Any new allergies since your discharge? No  Dietary orders reviewed? Yes Do you have support at home? Yes   Follow up appointments reviewed:  PCP Hospital f/u appt confirmed? Yes  Scheduled to see Aldine Contes. Durene Cal, MD on 03/24/2023 @ [facility]. Specialist Hospital f/u appt confirmed? No  Scheduled to see  on  @ . Are transportation arrangements needed? No  If their condition worsens, is the pt aware to call PCP or go to the Emergency Dept.? Yes Was the patient provided with contact information for the PCP's office or ED? Yes Was to pt encouraged to call back with questions or concerns? Yes   Daison Braxton [facility]  [facility], [facility] Guide Direct Dial: [phone]  Website: [email or web address]

## 2023-03-24 ENCOUNTER — Encounter: Payer: Self-pay | Admitting: Family Medicine

## 2023-03-24 ENCOUNTER — Ambulatory Visit: Payer: BC Managed Care – PPO | Admitting: Family Medicine

## 2023-03-24 VITALS — BP 109/70 | HR 64 | Temp 97.2°F | Ht 60.0 in | Wt 95.8 lb

## 2023-03-24 DIAGNOSIS — I700 Atherosclerosis of aorta: Secondary | ICD-10-CM | POA: Diagnosis not present

## 2023-03-24 DIAGNOSIS — Z87891 Personal history of nicotine dependence: Secondary | ICD-10-CM | POA: Diagnosis not present

## 2023-03-24 DIAGNOSIS — J449 Chronic obstructive pulmonary disease, unspecified: Secondary | ICD-10-CM | POA: Diagnosis not present

## 2023-03-24 DIAGNOSIS — Z0000 Encounter for general adult medical examination without abnormal findings: Secondary | ICD-10-CM | POA: Diagnosis not present

## 2023-03-24 DIAGNOSIS — M19049 Primary osteoarthritis, unspecified hand: Secondary | ICD-10-CM | POA: Diagnosis not present

## 2023-03-24 DIAGNOSIS — M85852 Other specified disorders of bone density and structure, left thigh: Secondary | ICD-10-CM

## 2023-03-24 DIAGNOSIS — E785 Hyperlipidemia, unspecified: Secondary | ICD-10-CM

## 2023-03-24 LAB — URINALYSIS, ROUTINE W REFLEX MICROSCOPIC
Bilirubin Urine: NEGATIVE
Ketones, ur: NEGATIVE
Leukocytes,Ua: NEGATIVE
Nitrite: NEGATIVE
Specific Gravity, Urine: <=1.005 — AB (ref 1.000–1.030)
Total Protein, Urine: NEGATIVE
Urine Glucose: NEGATIVE
Urobilinogen, UA: 0.2 (ref 0.0–1.0)
WBC, UA: NONE SEEN (ref 0–?)
pH: 6 (ref 5.0–8.0)

## 2023-03-24 NOTE — Progress Notes (Signed)
Phone [phone]   Subjective:  Patient presents today for their annual physical. Chief complaint-noted.   See problem oriented charting- ROS- full  review of systems was completed and negative except for: slight weight gain with quitting smoking- weight is healthier as result  The following were reviewed and entered/updated in epic: Past Medical History:  Diagnosis Date   Arthritis Dx 2006   Frequent UTI    Knee joint disorder    reports gel injections in past   Pneumonia    Yeast infection    Patient Active Problem List   Diagnosis Date Noted   COPD (chronic obstructive pulmonary disease) (HCC) 09/13/2015    Priority: High   Smoker 03/27/2014    Priority: High   Aortic atherosclerosis (HCC) 06/13/2022    Priority: Medium    Abdominal bloating 03/20/2017    Priority: Medium    Family history of early CAD 04/02/2016    Priority: Medium    Hypersomnia 09/14/2015    Priority: Low   Chronic fatigue 05/24/2015    Priority: Low   Osteoarthritis 09/19/2014    Priority: Low   Low back pain 03/27/2014    Priority: Low   Early menopause 03/27/2014    Priority: Low   Past Surgical History:  Procedure Laterality Date   cyst removed Right 2009    elbow, Dr. Kelli Hope    LAPAROSCOPIC ABDOMINAL EXPLORATION      Family History  Problem Relation Age of Onset   COPD Mother    Rheum arthritis Mother        biological mom   Heart disease Father    Hypertension Father    Diabetes Maternal Grandmother    Thyroid disease Maternal Grandmother    Hypertension Brother    Psoriasis Brother    Colon cancer Neg Hx     Medications- reviewed and updated Current Outpatient Medications  Medication Sig Dispense Refill   albuterol (VENTOLIN HFA) 108 (90 Base) MCG/ACT inhaler Inhale 2 puffs into the lungs every 6 (six) hours as needed for wheezing or shortness of breath. 1 each 5   Bacillus Coagulans-Inulin (ALIGN PREBIOTIC-PROBIOTIC PO) Take by mouth.     Biotin 09811 MCG TABS  Take by mouth.     umeclidinium-vilanterol (ANORO ELLIPTA) 62.5-25 MCG/ACT AEPB Inhale 1 puff into the lungs daily. 60 each 5   No current facility-administered medications for this visit.    Allergies-reviewed and updated Allergies  Allergen Reactions   Prednisone Other (See Comments)    Pt reports "homicidal thoughts"   Penicillins Rash    Has patient had a PCN reaction causing immediate rash, facial/tongue/throat swelling, SOB or lightheadedness with hypotension: Yes Has patient had a PCN reaction causing severe rash involving mucus membranes or skin necrosis: No Has patient had a PCN reaction that required hospitalization No Has patient had a PCN reaction occurring within the last 10 years: No If all of the above answers are "NO", then may proceed with Cephalosporin use.     Social History   Social History Narrative   Remarried July 2023. Divorced. 3 children-Age 11, 21, 19- in 2023. Has 3, 29 yo grandchildren. 3 step grandchildren in [place]- doesn't see much. Mother passed away in 18-Sep-2013.       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 Oropharynx normal Neck: no thyromegaly CV: RRR no murmurs rubs or gallops Lungs: CTAB no crackles, wheeze, rhonchi Abdomen: soft/nontender/nondistended/normal bowel sounds. No rebound or guarding.  Ext: no edema Skin: warm, dry Neuro: grossly normal, moves all extremities, PERRLA   Assessment and Plan   57 y.o. female presenting for annual physical.  Health Maintenance counseling: 1. Anticipatory guidance: Patient counseled regarding regular dental exams - dentures so doesn't go, eye  exams - due for one,  avoiding smoking and second hand smoke- see below , limiting alcohol to 1 beverage per day- 3 a month , no illicit drugs .   2. Risk factor reduction:  Advised patient of need for regular exercise and diet rich and fruits and vegetables to reduce risk of heart attack and stroke.  Exercise- busy with work, wants to go to gym after quitting smoking- son Malinsky goes to [employer]- may get trainer.  Diet/weight management-actually encouraged weight gain- very low weight overall but at least BMI over 18.  Wt Readings from Last 3 Encounters:  03/24/23 95 lb 12.8 oz (43.5 kg)  02/08/23 93 lb (42.2 kg)  04/10/22 93 lb 12.8 oz (42.5 kg)  3. Immunizations/screenings/ancillary studies- declines flu, COVID, Tetanus, Diphtheria, and Pertussis (Tdap), Prevnar 20 4. Cervical cancer screening- none on file- has seen gynecology 01/06/23- team will abstract 5. Breast cancer screening-  breast exam - with gynecology and self exams- and mammogram - reports due- agrees to call gynecology to schedule 6. Colon cancer screening - 03/11/2017 with 10 year repeat 7. Skin cancer screening- no dermatologist. advised regular sunscreen use. Denies worrisome, changing, or new skin lesions.  8. Birth control/STD check- postmenopausal and monogamous  9. Osteoporosis screening at 9- osteopenia left hip 04/20/14 DEXA- we will repeat this  10. Smoking associated screening - former smoker- 20 days cigarette free after visit with [facility]. Breakfast and dinner still hardest times for her to not smoke. Get urinalysis with labs- continue lung cancer screening  Status of chronic or acute concerns   # COPD S: Medication: Anoro once daily, albuterol as needed -had flare up with Emergency Department visit on 02/08/23- needed solumedrol, nebulizer. Thought to have upper respiratory infection (URI) trigger. Was thankfully able to be discharged home- she really wanted to go home and pushed for this. Troponin  was not elevated - cough has been improving 20 days cigarette free -January has PFTs reported A/P: COPD appears to be improving with Anoro use and quitting smoking- encouraged her to continue current medications    #hyperlipidemia with coronary artery calcifications and aortic atherosclerosis  S: Medication: none Lab Results  Component Value Date   CHOL 152 01/06/2020   HDL 53 01/06/2020   LDLCALC 84 01/06/2020   TRIG 69 01/06/2020   CHOLHDL 2.9 01/06/2020   A/P: Mild LDL elevations-update with labs today- discussed pushing for LDL under 70 especially with long term smoking history  # Hand pain- had seen Dr. Charlett Blake in the past- diagnosed with osteoarthritis. Also had gel shots in the knee.  -shed like to establish with Dr. Margaretha Sheffield- we will trial but he has shifted to part time  #fatigue- does take biotin- we will check TSH but if abnormal may need repeat off biotin  Recommended follow  up: Return in about 1 year (around 03/23/2024) for physical or sooner if needed.Schedule b4 you leave.  Lab/Order associations:NOT fasting   ICD-10-CM   1. Preventative health care  Z00.00     2. Hand arthritis  M19.049 Ambulatory referral to Sports Medicine    3. Chronic obstructive pulmonary disease, unspecified COPD type (HCC)  J44.9     4. Aortic atherosclerosis (HCC)  I70.0     5. Former smoker  Z87.891 Urinalysis, Routine w reflex microscopic    6. Hyperlipidemia, unspecified hyperlipidemia type  E78.5 Comprehensive metabolic panel    CBC with Differential/Platelet    Lipid panel    Urinalysis, Routine w reflex microscopic    TSH    7. Osteopenia of left hip  M85.852 DG Bone Density      No orders of the defined types were placed in this encounter.   Return precautions advised.  Tana Conch, MD

## 2023-03-24 NOTE — Patient Instructions (Addendum)
Health Maintenance Due  Topic Date Due   DTaP/Tdap/Td (1 - Tdap)- due for this if you get cut scrape Never done   Cervical Cancer Screening (HPV/Pap Cotest) -Team please pull in her pap smear from last gynecology note 03/19/2022   Referral already in for [facility] [address], [place], [place] [address] [phone] -all you need to do is call and ask for a new physician since Dr. Chestine Spore only does inpatient  We have placed a referral for you today to Dr. Margaretha Sheffield. In some cases you will see # listed below- you can call this if you have not heard within a week. If you do not see # listed- you should receive a mychart message or phone call within a week with the # to call directly- call that as soon as you get it. If you are having issues getting scheduled reach out to [place] again.   Schedule your bone density test at check out desk.  - located [address] across the street from [place] - in the basement - you DO NEED an appointment for the bone density tests.   Please stop by lab before you go If you have mychart- we will send your results within 3 business days of [place] receiving them.  If you do not have mychart- we will call you about results within 5 business days of [place] receiving them.  *please also note that you will see labs on mychart as soon as they post. I will later go in and write notes on them- will say "notes from Dr. Durene Cal"   Recommended follow up: Return in about 1 year (around 03/23/2024) for physical or sooner if needed.Schedule b4 you leave.

## 2023-03-25 ENCOUNTER — Encounter: Payer: Self-pay | Admitting: Family Medicine

## 2023-03-25 LAB — COMPREHENSIVE METABOLIC PANEL
ALT: 14 U/L (ref 0–35)
AST: 19 U/L (ref 0–37)
Albumin: 4.2 g/dL (ref 3.5–5.2)
Alkaline Phosphatase: 64 U/L (ref 39–117)
BUN: 7 mg/dL (ref 6–23)
CO2: 30 meq/L (ref 19–32)
Calcium: 9 mg/dL (ref 8.4–10.5)
Chloride: 104 meq/L (ref 96–112)
Creatinine, Ser: 0.72 mg/dL (ref 0.40–1.20)
GFR: 94.37 mL/min (ref >60.00)
Glucose, Bld: 74 mg/dL (ref 70–99)
Potassium: 3.8 meq/L (ref 3.5–5.1)
Sodium: 140 meq/L (ref 135–145)
Total Bilirubin: 0.3 mg/dL (ref 0.2–1.2)
Total Protein: 6.7 g/dL (ref 6.0–8.3)

## 2023-03-25 LAB — CBC WITH DIFFERENTIAL/PLATELET
Basophils Absolute: 0.1 10*3/uL (ref 0.0–0.1)
Basophils Relative: 1.5 % (ref 0.0–3.0)
Eosinophils Absolute: 0.1 10*3/uL (ref 0.0–0.7)
Eosinophils Relative: 2 % (ref 0.0–5.0)
HCT: 42.9 % (ref 36.0–46.0)
Hemoglobin: 14.3 g/dL (ref 12.0–15.0)
Lymphocytes Relative: 46.3 % — ABNORMAL HIGH (ref 12.0–46.0)
Lymphs Abs: 3.3 10*3/uL (ref 0.7–4.0)
MCHC: 33.4 g/dL (ref 30.0–36.0)
MCV: 94.2 fL (ref 78.0–100.0)
Monocytes Absolute: 0.6 10*3/uL (ref 0.1–1.0)
Monocytes Relative: 8.7 % (ref 3.0–12.0)
Neutro Abs: 2.9 10*3/uL (ref 1.4–7.7)
Neutrophils Relative %: 41.5 % — ABNORMAL LOW (ref 43.0–77.0)
Platelets: 198 10*3/uL (ref 150.0–400.0)
RBC: 4.55 Mil/uL (ref 3.87–5.11)
RDW: 13.1 % (ref 11.5–15.5)
WBC: 7.1 10*3/uL (ref 4.0–10.5)

## 2023-03-25 LAB — LIPID PANEL
Cholesterol: 177 mg/dL (ref 0–200)
HDL: 59.3 mg/dL (ref >39.00)
LDL Cholesterol: 98 mg/dL (ref 0–99)
NonHDL: 117.22
Total CHOL/HDL Ratio: 3
Triglycerides: 94 mg/dL (ref 0.0–149.0)
VLDL: 18.8 mg/dL (ref 0.0–40.0)

## 2023-03-25 LAB — TSH: TSH: 2.31 u[IU]/mL (ref 0.35–5.50)

## 2023-04-06 ENCOUNTER — Ambulatory Visit: Payer: BC Managed Care – PPO | Admitting: Family Medicine

## 2023-04-06 ENCOUNTER — Ambulatory Visit (HOSPITAL_BASED_OUTPATIENT_CLINIC_OR_DEPARTMENT_OTHER)
Admission: RE | Admit: 2023-04-06 | Discharge: 2023-04-06 | Disposition: A | Discharge status: Home / Self Care | Payer: BC Managed Care – PPO | Source: Ambulatory Visit | Attending: Family Medicine | Admitting: Family Medicine

## 2023-04-06 VITALS — BP 110/60 | Ht 60.0 in | Wt 92.0 lb

## 2023-04-06 DIAGNOSIS — M25531 Pain in right wrist: Secondary | ICD-10-CM | POA: Insufficient documentation

## 2023-04-06 DIAGNOSIS — G8929 Other chronic pain: Secondary | ICD-10-CM | POA: Insufficient documentation

## 2023-04-06 DIAGNOSIS — M79641 Pain in right hand: Secondary | ICD-10-CM

## 2023-04-06 DIAGNOSIS — M1811 Unilateral primary osteoarthritis of first carpometacarpal joint, right hand: Secondary | ICD-10-CM | POA: Diagnosis not present

## 2023-04-06 DIAGNOSIS — M79631 Pain in right forearm: Secondary | ICD-10-CM | POA: Diagnosis not present

## 2023-04-06 MED ORDER — MELOXICAM 15 MG PO TABS: 15.0000 mg | ORAL_TABLET | Freq: Every day | ORAL | 0 refills | Status: DC

## 2023-04-06 NOTE — Progress Notes (Signed)
CHIEF COMPLAINT: No chief complaint on file.  _____________________________________________________________ SUBJECTIVE  HPI  Pt is a 57 y.o. female here for evaluation of R hand pain > Left  Ongoing for at least 7 years. Had a previous provider Charlett Blake) has received gel injections for it, was also diagnosed with hand OA in the past. 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 Will get heated gloves from hobby lobby, found that this has been helpful. Tender along thumb palm, described as a constant pain. When trying to move her hand will feel stronger pain. Low dull pain. No numbness, occasionally tingling (no specific maneuver). When she pushes her hand down to stretch out her hands it is painful and hard to do. Concern is that it will only get worse. Hand will lock up when she makes bows.   No recent imaging  Mhx includes history knee OA (hx gel injections), elbow cyst s/p removal 2006,  ------------------------------------------------------------------------------------------------------ Past Medical History:  Diagnosis Date   Arthritis Dx 2006   Frequent UTI    Knee joint disorder    reports gel injections in past   Pneumonia    Yeast infection     Past Surgical History:  Procedure Laterality Date   cyst removed Right 2009    elbow, Dr. Kelli Hope    LAPAROSCOPIC ABDOMINAL EXPLORATION        No outpatient medications have been marked as taking for the 04/06/23 encounter (Appointment) with Burna Forts, MD.    ------------------------------------------------------------------------------------------------------  _____________________________________________________________ OBJECTIVE  PHYSICAL EXAM  Today's Vitals   04/06/23 1110  BP: 110/60  Weight: 92 lb (41.7 kg)  Height: 5' (1.524 m)   There is no height or weight on file to calculate BMI.   reviewed  General: A+Ox3, no acute distress, well-nourished, appropriate affect CV:  pulses 2+ regular, nondiaphoretic, no peripheral edema, cap refill <2sec Lungs: no audible wheezing, non-labored breathing, bilateral chest rise/fall, nontachypneic Skin: warm, well-perfused, non-icteric, no susp lesions or rashes Neuro: no focal deficits. Sensation intact, muscle tone wnl, no atrophy Psych: no signs of depression or anxiety MSK:  Radial pulse intact, light touch sensation intact limited wrist extension, full flexion, b/l equivalent abduction/adduction enlarged MCPJ b/l, slightly more enlarged on the R compared to L TTP radial wrist, CMCJ, 1st IPJ, Most tender over volar soft tissue/thenar musculature surrounding 1st MCPJ Able to make a complete fist, grip strength diminished R side. pain elicited with resisted flexion/extension/abduction - tinel's sign, phalens _____________________________________________________________ ASSESSMENT/PLAN Diagnoses and all orders for this visit:  Chronic hand pain, right -     DG Hand Complete Right; Future -     DG Wrist Complete Right; Future -     Ambulatory referral to Occupational Therapy  Chronic wrist pain, right -     DG Hand Complete Right; Future -     DG Wrist Complete Right; Future -     Ambulatory referral to Occupational Therapy  Other orders -     meloxicam (MOBIC) 15 MG tablet; Take 1 tablet (15 mg total) by mouth daily.   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 No films available. XR ordered hand/wrist for further evaluation.  Options for mgmt discussed. Pt declines topical voltaren. Continue tigerbalm/ menthol ointment, heat compresses. trial Rx meloxicam for pain management. OT referral. May consider paraffin wax treatment. F/u pending XR results, may consider [place] CSI   Electronically signed by: Burna Forts, MD 04/06/2023 7:48 AM

## 2023-04-07 ENCOUNTER — Encounter: Payer: Self-pay | Admitting: Family Medicine

## 2023-04-15 ENCOUNTER — Telehealth: Payer: Self-pay | Admitting: Family Medicine

## 2023-04-15 ENCOUNTER — Other Ambulatory Visit: Payer: Self-pay

## 2023-04-15 ENCOUNTER — Ambulatory Visit: Payer: BC Managed Care – PPO | Attending: Family Medicine | Admitting: Occupational Therapy

## 2023-04-15 DIAGNOSIS — M79641 Pain in right hand: Secondary | ICD-10-CM | POA: Diagnosis not present

## 2023-04-15 DIAGNOSIS — G8929 Other chronic pain: Secondary | ICD-10-CM | POA: Diagnosis not present

## 2023-04-15 DIAGNOSIS — M79642 Pain in left hand: Secondary | ICD-10-CM | POA: Diagnosis not present

## 2023-04-15 DIAGNOSIS — R278 Other lack of coordination: Secondary | ICD-10-CM | POA: Insufficient documentation

## 2023-04-15 DIAGNOSIS — M6281 Muscle weakness (generalized): Secondary | ICD-10-CM | POA: Diagnosis not present

## 2023-04-15 DIAGNOSIS — M1811 Unilateral primary osteoarthritis of first carpometacarpal joint, right hand: Secondary | ICD-10-CM | POA: Insufficient documentation

## 2023-04-15 DIAGNOSIS — M25531 Pain in right wrist: Secondary | ICD-10-CM | POA: Insufficient documentation

## 2023-04-15 NOTE — Therapy (Unsigned)
OUTPATIENT OCCUPATIONAL THERAPY ORTHO EVALUATION  Patient Name: Cynthia Duran MRN: [id number] DOB:1966-04-15, 57 y.o., female Today's Date: 04/15/2023  PCP: Shelva Majestic, MD REFERRING PROVIDER: Burna Forts, MD  END OF SESSION:  OT End of Session - 04/15/23 1229     Visit Number 1    Number of Visits 6    Authorization Type BCBS 2024 30% VL: 30 combined (PT/OT) Auth Not Reqd    OT Start Time 1230    OT Stop Time 1315    OT Time Calculation (min) 45 min    Equipment Utilized During Treatment Testing Material    Activity Tolerance Patient tolerated treatment well;Patient limited by pain    Behavior During Therapy [facility] for tasks assessed/performed             Past Medical History:  Diagnosis Date   Arthritis Dx 2006   Frequent UTI    Knee joint disorder    reports gel injections in past   Pneumonia    Yeast infection    Past Surgical History:  Procedure Laterality Date   cyst removed Right 2009    elbow, Dr. Kelli Hope    LAPAROSCOPIC ABDOMINAL EXPLORATION     Patient Active Problem List   Diagnosis Date Noted   Aortic atherosclerosis (HCC) 06/13/2022   Abdominal bloating 03/20/2017   Family history of early CAD 04/02/2016   Hypersomnia 09/14/2015   COPD (chronic obstructive pulmonary disease) (HCC) 09/13/2015   Chronic fatigue 05/24/2015   Osteoarthritis 09/19/2014   Smoker 03/27/2014   Low back pain 03/27/2014   Early menopause 03/27/2014    ONSET DATE: 04/06/2023  REFERRING DIAG:  M79.641,G89.29 (ICD-10-CM) - Chronic hand pain, right  M25.531,G89.29 (ICD-10-CM) - Chronic wrist pain, right  THERAPY DIAG:  Pain in right hand  Pain in left hand  Muscle weakness (generalized)  Other lack of coordination  Osteoarthritis of carpometacarpal (CMC) joint of right thumb, unspecified osteoarthritis type  Rationale for Evaluation and Treatment: Rehabilitation  SUBJECTIVE:   SUBJECTIVE STATEMENT: ***Take something for inflammation ie) Advil,  acetimiphen - [employer] 650 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 Non smoker October 23 - quit smoking (2 pack a day)- hypnotized  COPD Pt accompanied by: self  PERTINENT HISTORY:   X-Ray results completed today Right wrist:  Severe thumb carpometacarpal joint space narrowing, subchondral sclerosis, subchondral cystic change, and peripheral osteophytosis. Mild triscaphe joint space narrowing. 2 mm ulnar negative variance. Mild distal radioulnar joint space narrowing, subchondral sclerosis, and peripheral spurring.   Right hand: Mild first through fifth interphalangeal joint space narrowing.   No acute fracture or dislocation.   IMPRESSION: 1. Severe thumb carpometacarpal osteoarthritis. 2. 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 3. Mild first through fifth interphalangeal osteoarthritis.      PRECAUTIONS: None  RED FLAGS: {PT Red Flags:29287}   WEIGHT BEARING RESTRICTIONS: No  PAIN:  Are you having pain? Yes: NPRS scale: 4/10 Pain location: base of R thumb Pain description: constant Aggravating factors: working/hurts all the time Relieving factors: wears thermal gloves   FALLS: Has patient fallen in last 6 months? 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  HAND DOMINANCE: Right  ADLs: Hard to open stuff If she has trouble cutting  Ok with buttons Cleaning self with toileting - has changed   FUNCTIONAL OUTCOME MEASURES: Quick Dash: 38.6  UPPER EXTREMITY ROM:    Active ROM Right eval Left eval  Shoulder flexion [facility]  Shoulder abduction    Shoulder adduction    Shoulder extension    Shoulder internal rotation    Shoulder external rotation    Middle trapezius    Lower trapezius    Elbow flexion WFL   Elbow extension   Wrist flexion Slight end range limitations  Wrist extension   Wrist ulnar deviation    Wrist radial deviation    Wrist pronation    Wrist supination    (Blank rows = not tested)   Full composite flexion    UPPER EXTREMITY MMT:    MMT Right eval Left eval  Shoulder flexion 4 4  Shoulder abduction    Shoulder adduction    Shoulder extension    Shoulder internal rotation    Shoulder external rotation    Elbow flexion 4+ 4+  Elbow extension 4+ 4+  Wrist flexion 3- 3-  Wrist extension 3- 3-  Wrist ulnar deviation    Wrist radial deviation    Wrist pronation    Wrist supination    (Blank rows = not tested)   HAND FUNCTION: Grip strength: Right: 19.1, 20.7, 24.0 lbs; Left: 20.7, 17.1, 21.1 lbs Average: Right: 21.3 lbs Left: 19.6 lbs  COORDINATION: 9 Hole Peg test: Right: 17.86 sec; Left: 20.71 sec  SENSATION: WFL  EDEMA: Occasionally in R hand  OBSERVATIONS: Pt arrived with no AE and no splints/braces in place.  She has a larger R CMC joint and is noted to   TODAY'S TREATMENT:                                                                                                                              NA - eval only on this date  PATIENT EDUCATION: Education details: OT role and POC considerations & initiated conversation re: AE and jt protection Person educated: Patient Education method: Explanation Education comprehension: verbalized  understanding  HOME EXERCISE PROGRAM: TBD  GOALS: Goals reviewed with patient? Yes  SHORT TERM GOALS: Target date: 05/12/23  *** Baseline: Goal status: INITIAL  2.  *** Baseline:  Goal status: INITIAL  3.  *** Baseline:  Goal status: INITIAL  4.  *** Baseline:  Goal status: INITIAL  5.  *** Baseline:  Goal  status: INITIAL  6.  *** Baseline:  Goal status: INITIAL  LONG TERM GOALS: Target date: 06/12/23  *** Baseline:  Goal status: INITIAL  2.  *** Baseline:  Goal status: INITIAL  3.  *** Baseline:  Goal status: INITIAL  4.  *** Baseline:  Goal status: INITIAL  5.  *** Baseline:  Goal status: INITIAL  6.  *** Baseline:  Goal status: INITIAL  ASSESSMENT:  CLINICAL IMPRESSION: Patient is a 57 y.o. female who was seen today for occupational therapy evaluation for osteoarthritis affecting BUEs - especially thumbs. Hx includes COPD and former smoker (quit in Oct 2024). Patient currently presents with difficulty using hands for job as florist due to functional deficits in B grip strength and resulting pain. Pt would benefit from skilled OT services in the outpatient setting to work on impairments as noted below to help pt return to PLOF as able.     PERFORMANCE DEFICITS: in functional skills including ADLs, IADLs, coordination, sensation, ROM, strength, pain, Fine motor control, Gross motor control, endurance, decreased knowledge of precautions, decreased knowledge of use of DME, and UE functional use, and psychosocial skills including coping strategies, environmental adaptation, and routines and behaviors.   IMPAIRMENTS: are limiting patient from {OT performance deficits:25471}.   COMORBIDITIES: {Comorbidities:25485} that affects occupational performance. Patient will benefit from skilled OT to address above impairments and improve overall function.  MODIFICATION OR ASSISTANCE TO COMPLETE EVALUATION: {OT modification:25474}  OT OCCUPATIONAL PROFILE  AND HISTORY: {OT PROFILE AND HISTORY:25484}  CLINICAL DECISION MAKING: {OT CDM:25475}  REHAB POTENTIAL: {rehabpotential:25112}  EVALUATION COMPLEXITY: Low  PLAN:  OT FREQUENCY: 1x/week  OT DURATION: 6 weeks  PLANNED INTERVENTIONS: 97535 self care/ADL training, 87564 therapeutic exercise, 97530 therapeutic activity, 97018 paraffin, 33295 fluidotherapy, 97760 Splinting (initial encounter), [phone] Subsequent splinting/medication, energy conservation, coping strategies training, patient/family education, and DME and/or AE instructions  RECOMMENDED OTHER SERVICES: May consult with Ortho - Dr. Fara Boros re: severe CMC OA if necessary  CONSULTED AND AGREED WITH PLAN OF CARE: Patient  PLAN FOR NEXT SESSION:  Check splints (pt to bring from home) - thumb spica options to help at work Modality trials for home use - paraffin/heat Joint protection education AE education for ADLs and work tasks ie) large handles as comfort allows, bow maker Pt has joined gym - check HEP   Victorino Sparrow, OT 04/15/2023, 3:30 PM

## 2023-04-15 NOTE — Telephone Encounter (Signed)
Pt is requesting to have a Mammogram. Please advise

## 2023-04-15 NOTE — Telephone Encounter (Signed)
Advised patient that she does not need order for screening mammogram.  Number given for her to call [facility].

## 2023-04-21 ENCOUNTER — Ambulatory Visit: Payer: BC Managed Care – PPO | Admitting: Occupational Therapy

## 2023-04-22 ENCOUNTER — Ambulatory Visit (INDEPENDENT_AMBULATORY_CARE_PROVIDER_SITE_OTHER)
Admission: RE | Admit: 2023-04-22 | Discharge: 2023-04-22 | Disposition: A | Discharge status: Home / Self Care | Payer: BC Managed Care – PPO | Source: Ambulatory Visit | Attending: Family Medicine | Admitting: Family Medicine

## 2023-04-22 DIAGNOSIS — M85852 Other specified disorders of bone density and structure, left thigh: Secondary | ICD-10-CM | POA: Diagnosis not present

## 2023-04-23 DIAGNOSIS — M85852 Other specified disorders of bone density and structure, left thigh: Secondary | ICD-10-CM | POA: Diagnosis not present

## 2023-04-28 ENCOUNTER — Ambulatory Visit: Payer: BC Managed Care – PPO | Admitting: Occupational Therapy

## 2023-04-28 DIAGNOSIS — M79642 Pain in left hand: Secondary | ICD-10-CM | POA: Diagnosis not present

## 2023-04-28 DIAGNOSIS — M25531 Pain in right wrist: Secondary | ICD-10-CM | POA: Diagnosis not present

## 2023-04-28 DIAGNOSIS — M6281 Muscle weakness (generalized): Secondary | ICD-10-CM

## 2023-04-28 DIAGNOSIS — R278 Other lack of coordination: Secondary | ICD-10-CM

## 2023-04-28 DIAGNOSIS — M1811 Unilateral primary osteoarthritis of first carpometacarpal joint, right hand: Secondary | ICD-10-CM | POA: Diagnosis not present

## 2023-04-28 DIAGNOSIS — M79641 Pain in right hand: Secondary | ICD-10-CM | POA: Diagnosis not present

## 2023-04-28 DIAGNOSIS — G8929 Other chronic pain: Secondary | ICD-10-CM | POA: Diagnosis not present

## 2023-04-28 NOTE — Therapy (Unsigned)
OUTPATIENT OCCUPATIONAL THERAPY ORTHO TREATMENT  Patient Name: Cynthia Duran MRN: [id number] DOB:Aug 31, 1965, 57 y.o., female Today's Date: 04/28/2023  PCP: Shelva Majestic, MD REFERRING PROVIDER: Burna Forts, MD  END OF SESSION:  OT End of Session - 04/28/23 1539     Visit Number 2    Number of Visits 6    Authorization Type BCBS 2024 30% VL: 30 combined (PT/OT) Auth Not Reqd    OT Start Time 1537    OT Stop Time 1640    OT Time Calculation (min) 63 min    Activity Tolerance Patient tolerated treatment well    Behavior During Therapy [facility] for tasks assessed/performed             Past Medical History:  Diagnosis Date   Arthritis Dx 2006   Frequent UTI    Knee joint disorder    reports gel injections in past   Pneumonia    Yeast infection    Past Surgical History:  Procedure Laterality Date   cyst removed Right 2009    elbow, Dr. Kelli Hope    LAPAROSCOPIC ABDOMINAL EXPLORATION     Patient Active Problem List   Diagnosis Date Noted   Aortic atherosclerosis (HCC) 06/13/2022   Abdominal bloating 03/20/2017   Family history of early CAD 04/02/2016   Hypersomnia 09/14/2015   COPD (chronic obstructive pulmonary disease) (HCC) 09/13/2015   Chronic fatigue 05/24/2015   Osteoarthritis 09/19/2014   Smoker 03/27/2014   Low back pain 03/27/2014   Early menopause 03/27/2014    ONSET DATE: 04/06/2023  REFERRING DIAG:  M79.641,G89.29 (ICD-10-CM) - Chronic hand pain, right  M25.531,G89.29 (ICD-10-CM) - Chronic wrist pain, right  THERAPY DIAG:  Pain in right hand  Pain in left hand  Muscle weakness (generalized)  Other lack of coordination  Osteoarthritis of carpometacarpal (CMC) joint of right thumb, unspecified osteoarthritis type  Rationale for Evaluation and Treatment: Rehabilitation  SUBJECTIVE:   SUBJECTIVE STATEMENT:  Pt reported that she was unable to get into her work building the other day and had to wait for her husband to open the door knob  due to pain and weakness in her hands.  She also got some help with the 100 bows she had to make for her poinsettia order.  She has a fairly new order for Meloxicam to for the arthritis but is not taking it daily as she does not prefer to take medicine regularly but she is willing to try it again.  The last time she took it was about 3 days ago.  Pt accompanied by: self  PERTINENT HISTORY:   X-Ray results completed and read day of OT evaluation: Right wrist:  Severe thumb carpometacarpal joint space narrowing, subchondral sclerosis, subchondral cystic change, and peripheral osteophytosis. Mild triscaphe joint space narrowing. 2 mm ulnar negative variance. Mild distal radioulnar joint space narrowing, subchondral sclerosis, and peripheral spurring.   Right hand: Mild first through fifth interphalangeal joint space narrowing.   No acute fracture or dislocation.   IMPRESSION: 1. Severe thumb carpometacarpal osteoarthritis. 2. 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 3. Mild first through fifth interphalangeal osteoarthritis.  PRECAUTIONS: None  RED FLAGS: None   WEIGHT BEARING RESTRICTIONS: No  PAIN: Took Melixicom ~ 3 days ago, 1 time advil in past 3 days Are you having pain? Yes: NPRS scale: 2/10 Pain location: base of R thumb Pain description: constant Aggravating factors: working/hurts all the time Relieving factors: wears thermal gloves  She may take something for inflammation ie) Advil  or [employer] brand acetaminophen - 650mg .  FALLS: Has patient fallen in last 6 months? No  LIVING ENVIRONMENT: Lives with: lives with their spouse Lives in: House Stairs: Yes: External: 3 steps; none Has following equipment at home: None  PLOF: Independent, driving - Van for work, Artist AM for fun  PATIENT GOALS: Pt wants to be able to use her hands to the best of her ability.  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  She has a 100 bows for  poinsettias in the next week or so.  She is trying to decide what she should do about continuing work.  NEXT MD VISIT: Not yet   OBJECTIVE:  Note: Objective measures were completed at Evaluation unless otherwise noted.  HAND DOMINANCE: Right  ADLs: Pt reports she has some trouble with aspects of ADLs/IADLs such as: -Hard to open stuff -If she has trouble cutting food, her husband helps or she changes item -Cleaning self with toileting - "has changed" -Pt says she is OK with buttons at this time  FUNCTIONAL OUTCOME MEASURES: Quick Dash: 38.6  UPPER EXTREMITY ROM:    Active ROM Right eval Left eval  Shoulder flexion [facility]  Shoulder abduction    Shoulder adduction    Shoulder extension    Shoulder internal rotation    Shoulder external rotation    Middle trapezius    Lower trapezius    Elbow flexion WFL   Elbow extension   Wrist flexion Slight end range limitations  Wrist extension   Wrist ulnar deviation    Wrist radial deviation    Wrist pronation    Wrist supination    (Blank rows = not tested)   Full composite flexion    UPPER EXTREMITY MMT:    MMT Right eval Left eval  Shoulder flexion 4 4  Shoulder abduction    Shoulder adduction    Shoulder extension    Shoulder internal rotation    Shoulder external rotation    Elbow flexion 4+ 4+  Elbow extension 4+ 4+  Wrist flexion 3- 3-  Wrist extension 3- 3-  Wrist ulnar deviation    Wrist radial deviation    Wrist pronation    Wrist supination    (Blank rows = not tested)   HAND FUNCTION: Grip strength: Right: 19.1, 20.7, 24.0 lbs; Left: 20.7, 17.1, 21.1 lbs Average: Right: 21.3 lbs Left: 19.6 lbs  COORDINATION: 9 Hole Peg test: Right: 17.86 sec; Left: 20.71 sec  SENSATION: WFL  EDEMA: Occasionally in R hand  OBSERVATIONS: Pt arrived with no AE and no splints/braces in place.  She has an enlarged R CMC joint and is noted to have to shake her hand and put her pen down even with a little bit of  writing to take a work order during OT evaluation today.  TODAY'S TREATMENT:  Therapeutic Activities  OT educated patient on sleep positioning as noted in patient instructions to reduce stress to upper extremity joints, which could be attributing to reported pain in hands. Patient verbalized understanding. Handout provided.  OT educated pt on joint protection principles as noted in pt instructions as needed to improve UE pain.   Handout is provided regarding joint protection and patient is encouraged to consider the specific acronym "LESS" ie) less strain on joints  L: Listen to your body E: Energy Conservation S: Stronger Joints take the Lead S: Strategize   Patient encouraged to protect hands and wrist by  - Respecting for Pain and stopping activities before they reach the point of discomfort or pain -- pt able to reports she does take rest breaks at work as needed - Rest and Work Development worker, international aid ie) balancing activities with appropriate rests during activity - Reduction of Effort - Use two hands instead of one if possible - pt confirms carryover of 2 handed activities  - Use of Larger/Stronger Joints Ie) Lift or carry with the forearm or shoulder rather than fingers - reviewed options including cart to push items, backpack versus purse - Avoid Activities That Cannot Be Stopped - Use of Assistive Equipment - Consider splint use and AE equipment to protect joints from deformity and stresses - reviewed splint options as pt has been using more like compression gloves but has pain at the base of her thumb. Trialed a Comfort Cool neoprene thumb spica splint which she is able to pick up and move objects with the splint which did not have any metal stays or hard aspects to. However, over time, wearing the splint increased discomfort in her forearm, even with trying to adjust strap for  increased comfort.  Therefore explored a more basic wrist wrap to trial for cues and prmpts to keep joints in neutral.  Then reviewed activities that can be modified with adaptive equipment and encourage patient to look adaptive equipment for arthritis [i.e. to consider joint protection].   Therapeutic Exercises Pt issued tendon gliding exercises/handout with review of motions to isolate DIP, PIP and MCP joints for straight finger position, hook (DIP/PIP flexion), fist (DIP/PIP/MCP flexion), taco/duck (MCP flexion only) and flat fist (MCP and PIP flexion). Reviewed the purpose of tendon glide exercises to increase the circulation to the hand and wrist, reduce swelling and promote healthier soft tissue (muscle, ligaments and tendons) and not necessarily to build hand or wrist strength.    PATIENT EDUCATION: Education details: OT goals, jt protection, sleep positions and tendon gliding exercises Person educated: Patient Education method: Explanation, Demonstration, Tactile cues, Verbal cues, and Handouts Education comprehension: verbalized understanding, returned demonstration, verbal cues required, and needs further education  HOME EXERCISE PROGRAM: 04/28/23: Sleep positions, initial jt protection, Tendon gliding exercises  GOALS: Goals reviewed with patient? Yes  SHORT TERM GOALS: Target date: 05/12/23   Patient will demonstrate simple quick break UE HEP for rests during work with 25% verbal cues or less for proper execution. Baseline: New to outpt OT Goal status: IN Progress   2.  Pt to trial increased use of prefab hand splints to help improve pain and improve overall comfort for functional use of BUE. Recommend prefab vs fab due to chronicity of issue, adjustability, comfort, and ease of cleaning. Baseline: CMC OA pain has been worsening Goal status: IN Progress   3.  Pt will independently recall at least 3 joint protection, ergonomics, and body mechanic principles as noted in pt  instructions to assist  with daily tasks with increased comfort and confidence.  Baseline:  New to outpt OT Goal status: IN Progress 12/17 - initiated education and provided handouts   4.  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 Baseline: Right: 21.3 lbs Left: 19.6 lbs Goal status: IN Progress   5.  Pt will independently recall sleep positioning options as noted in pt instructions to improve sleep disturbances from minimal to slight/no difficulty.  Baseline:  Minimal difficulty per QuickDash Goal status: IN Progress 12/17 - initiated education and provided handouts     LONG TERM GOALS: Target date: 06/11/22   Patient will demonstrate updated BUE HEP with visual cues only for proper execution. Baseline: New to outpt OT Goal status: INITIAL   2.  Pt will independently recall at least 3 options for adaptive equipment to assist with joint protection, ergonomics, and body mechanic principles as noted in pt instructions for ADLs and IADLS.  Baseline:  New to outpt OT Goal status: IN Progress   3.  Pt will be independent with home based pain management routine to potentially include gloves/splints, heat and jt protection principles for minimal pain. Baseline: 4/10 at eval but not working Goal status: IN Progress 12/17 - pt has heat pad at home   4.  Patient will demonstrate at improvement with quick Dash score reporting 30% disability or less indicating improved functional use of affected extremity.  Baseline: 38.6 Goal status: INITIAL  ASSESSMENT:  CLINICAL IMPRESSION: Patient is a 57 y.o. female who was seen today for occupational therapy treatment for osteoarthritis affecting BUEs - especially thumbs. Patient had difficulty using R hand to open her door at work this past week.  Education initiated for jt protection, adaptive equipment with pt provided some dycem to use at  home and Tendon gliding exercises introduced to keep mobility up in hands/digits. Pt will benefit from skilled OT services in the outpatient setting to work on impairments as noted to help pt return to max level of independence with access to AE as able.  PERFORMANCE DEFICITS: in functional skills including ADLs, IADLs, coordination, sensation, ROM, strength, pain, Fine motor control, Gross motor control, endurance, decreased knowledge of precautions, decreased knowledge of use of DME, and UE functional use, and psychosocial skills including coping strategies, environmental adaptation, and routines and behaviors.   IMPAIRMENTS: are limiting patient from ADLs, IADLs, rest and sleep, work, leisure, and social participation.   COMORBIDITIES: has co-morbidities such as OA, RA and COPD  that affects occupational performance. Patient will benefit from skilled OT to address above impairments and improve overall function.  REHAB POTENTIAL: Good  PLAN:  OT FREQUENCY: 1x/week  OT DURATION: 6 weeks  PLANNED INTERVENTIONS: 97535 self care/ADL training, 16109 therapeutic exercise, 97530 therapeutic activity, 97018 paraffin, 60454 fluidotherapy, 97760 Splinting (initial encounter), [id number] Subsequent splinting/medication, energy conservation, coping strategies training, patient/family education, and DME and/or AE instructions  RECOMMENDED OTHER SERVICES: May consult with Ortho - Dr. Fara Boros re: severe CMC OA if necessary  CONSULTED AND AGREED WITH PLAN OF CARE: Patient  PLAN FOR NEXT SESSION:  Check splints (pt to bring from home) - thumb spica options to help at work Modality trials for home use - paraffin/heat/fluido Further Joint protection education AE education for ADLs and work tasks ie) large handles as comfort allows, bow maker Pt has joined gym - check HEP   Victorino Sparrow, OT 04/28/2023, 4:49  PM

## 2023-04-28 NOTE — Patient Instructions (Signed)
      You can place your arms out to the sides with pillows underneath or place a pillow across your stomach with your hands resting on top for support. Hold a pillow with your arms away from your body.  Do not bend your arms at the elbows or tuck your hands under your head. Do not place your hand on top or underneath pillow. Use outside of pillow as a border.      

## 2023-05-08 ENCOUNTER — Other Ambulatory Visit: Payer: Self-pay | Admitting: Acute Care

## 2023-05-08 DIAGNOSIS — F17210 Nicotine dependence, cigarettes, uncomplicated: Secondary | ICD-10-CM

## 2023-05-08 DIAGNOSIS — Z87891 Personal history of nicotine dependence: Secondary | ICD-10-CM

## 2023-05-08 DIAGNOSIS — Z122 Encounter for screening for malignant neoplasm of respiratory organs: Secondary | ICD-10-CM

## 2023-05-12 ENCOUNTER — Other Ambulatory Visit: Payer: Self-pay | Admitting: Family Medicine

## 2023-05-12 ENCOUNTER — Ambulatory Visit: Payer: BC Managed Care – PPO | Admitting: Occupational Therapy

## 2023-05-12 ENCOUNTER — Encounter: Payer: Self-pay | Admitting: Physician Assistant

## 2023-05-12 ENCOUNTER — Ambulatory Visit: Payer: BC Managed Care – PPO | Admitting: Physician Assistant

## 2023-05-12 VITALS — BP 120/70 | HR 95 | Temp 97.6°F | Ht 60.0 in | Wt 102.0 lb

## 2023-05-12 DIAGNOSIS — R051 Acute cough: Secondary | ICD-10-CM

## 2023-05-12 LAB — POCT RAPID STREP A (OFFICE): Rapid Strep A Screen: NEGATIVE

## 2023-05-12 LAB — POCT INFLUENZA A/B
Influenza A, POC: NEGATIVE
Influenza B, POC: NEGATIVE

## 2023-05-12 LAB — POC COVID19 BINAXNOW: SARS Coronavirus 2 Ag: NEGATIVE

## 2023-05-12 MED ORDER — AZITHROMYCIN 250 MG PO TABS: ORAL_TABLET | ORAL | 0 refills | Status: AC

## 2023-05-12 NOTE — Progress Notes (Signed)
 Cynthia Duran is a 57 y.o. female here for a new problem.  History of Present Illness:   Chief Complaint  Patient presents with   Cough    Pt c/o cough x 1 week, expectorating green sputum, green nasal drainage, diarrhea, fatigue.   HPI  Cough: Complains of cough with expectoration of green sputum and green nasal drainage, diarrhea, and fatigue for the past week.   Notes symptoms do improve temporarily after taking medication - did not mention what medications she has taken.  Has been using her 62.5 - 25 mcg Anoro Ellipta  inhaler but hasn't needed her Albuterol  inhaler at all.  Endorses hx of PNA and COPD, some rib pain and sore throat due to cough, and mild ear pain.   Past Medical History:  Diagnosis Date   Arthritis Dx 2006   Frequent UTI    Knee joint disorder    reports gel injections in past   Pneumonia    Yeast infection     Social History   Tobacco Use   Smoking status: Former    Current packs/day: 1.50    Average packs/day: 1.5 packs/day for 42.0 years (63.0 ttl pk-yrs)    Types: Cigarettes   Smokeless tobacco: Never  Substance Use Topics   Alcohol use: Yes    Comment: socially    Drug use: No   Past Surgical History:  Procedure Laterality Date   cyst removed Right 2009    elbow, Dr. Velva    LAPAROSCOPIC ABDOMINAL EXPLORATION     Family History  Problem Relation Age of Onset   COPD Mother    Rheum arthritis Mother        biological mom   Heart disease Father    Hypertension Father    Diabetes Maternal Grandmother    Thyroid  disease Maternal Grandmother    Hypertension Brother    Psoriasis Brother    Colon cancer Neg Hx    Allergies  Allergen Reactions   Prednisone  Other (See Comments)    Pt reports homicidal thoughts   Penicillins Rash    Has patient had a PCN reaction causing immediate rash, facial/tongue/throat swelling, SOB or lightheadedness with hypotension: Yes Has patient had a PCN reaction causing severe rash involving mucus  membranes or skin necrosis: No Has patient had a PCN reaction that required hospitalization No Has patient had a PCN reaction occurring within the last 10 years: No If all of the above answers are NO, then may proceed with Cephalosporin use.    Current Medications:   Current Outpatient Medications:    albuterol  (VENTOLIN  HFA) 108 (90 Base) MCG/ACT inhaler, Inhale 2 puffs into the lungs every 6 (six) hours as needed for wheezing or shortness of breath., Disp: 1 each, Rfl: 5   azithromycin  (ZITHROMAX ) 250 MG tablet, Take 2 tablets on day 1, then 1 tablet daily on days 2 through 5, Disp: 6 tablet, Rfl: 0   Bacillus Coagulans-Inulin (ALIGN PREBIOTIC-PROBIOTIC PO), Take by mouth., Disp: , Rfl:    meloxicam  (MOBIC ) 15 MG tablet, Take 1 tablet (15 mg total) by mouth daily., Disp: 30 tablet, Rfl: 0   umeclidinium-vilanterol (ANORO ELLIPTA ) 62.5-25 MCG/ACT AEPB, Inhale 1 puff into the lungs daily., Disp: 60 each, Rfl: 5   Biotin 10000 MCG TABS, Take by mouth. (Patient not taking: Reported on 05/12/2023), Disp: , Rfl:   Review of Systems:   ROS See pertinent positives and negatives as per the HPI.  Vitals:   Vitals:   05/12/23 0951  BP:  120/70  Pulse: 95  Temp: 97.6 F (36.4 C)  TempSrc: Temporal  SpO2: 98%  Weight: 102 lb (46.3 kg)  Height: 5' (1.524 m)     Body mass index is 19.92 kg/m.  Physical Exam:   Physical Exam Vitals and nursing note reviewed.  Constitutional:      General: She is not in acute distress.    Appearance: She is well-developed. She is not ill-appearing or toxic-appearing.  Cardiovascular:     Rate and Rhythm: Normal rate and regular rhythm.     Pulses: Normal pulses.     Heart sounds: Normal heart sounds, S1 normal and S2 normal.  Pulmonary:     Effort: Pulmonary effort is normal.     Breath sounds: Normal breath sounds.  Skin:    General: Skin is warm and dry.  Neurological:     Mental Status: She is alert.     GCS: GCS eye subscore is 4. GCS  verbal subscore is 5. GCS motor subscore is 6.  Psychiatric:        Speech: Speech normal.        Behavior: Behavior normal. Behavior is cooperative.    Results for orders placed or performed in visit on 05/12/23  POC COVID-19  Result Value Ref Range   SARS Coronavirus 2 Ag Negative Negative  POCT rapid strep A  Result Value Ref Range   Rapid Strep A Screen Negative Negative  POCT Influenza A/B  Result Value Ref Range   Influenza A, POC Negative Negative   Influenza B, POC Negative Negative     Assessment and Plan:   Acute cough No red flags on exam.   Will initiate azithromycin  per orders.  Discussed taking medications as prescribed.  Reviewed return precautions including new or worsening fever, SOB, new or worsening cough or other concerns.  Push fluids and rest.  I recommend that patient follow-up if symptoms worsen or persist despite treatment x 7-10 days, sooner if needed.   I,Emily Lagle,acting as a neurosurgeon for [employer], PA.,have documented all relevant documentation on the behalf of Breonia Kirstein, PA,as directed by  Lucie Buttner, PA while in the presence of Lucie Buttner, [place].  I, Lucie Buttner, [place], have reviewed all documentation for this visit. The documentation on 05/12/23 for the exam, diagnosis, procedures, and orders are all accurate and complete.  Lucie Buttner, PA-C

## 2023-05-12 NOTE — Patient Instructions (Signed)
 It was great to see you!  Congrats on quitting smoking!!  Please start azithromycin  for early pneumonia   We will be in touch with all results -- or you can wait for them to run.  Push fluids and rest. Reach out with any concerns.  Take care,  Lucie Buttner PA-C

## 2023-05-19 ENCOUNTER — Ambulatory Visit: Payer: BC Managed Care – PPO | Attending: Family Medicine | Admitting: Occupational Therapy

## 2023-05-19 DIAGNOSIS — R278 Other lack of coordination: Secondary | ICD-10-CM | POA: Insufficient documentation

## 2023-05-19 DIAGNOSIS — M1811 Unilateral primary osteoarthritis of first carpometacarpal joint, right hand: Secondary | ICD-10-CM | POA: Insufficient documentation

## 2023-05-19 DIAGNOSIS — M79641 Pain in right hand: Secondary | ICD-10-CM | POA: Insufficient documentation

## 2023-05-19 DIAGNOSIS — M79642 Pain in left hand: Secondary | ICD-10-CM | POA: Insufficient documentation

## 2023-05-19 DIAGNOSIS — M6281 Muscle weakness (generalized): Secondary | ICD-10-CM | POA: Diagnosis not present

## 2023-05-19 NOTE — Therapy (Signed)
 OUTPATIENT OCCUPATIONAL THERAPY ORTHO TREATMENT  Patient Name: Cynthia Duran MRN: [id number] DOB:Jul 21, 1965, 58 y.o., female Today's Date: 05/19/2023  PCP: Katrinka Garnette KIDD, MD REFERRING PROVIDER: Marcene Benedict ORN, MD  END OF SESSION:  OT End of Session - 05/19/23 1529     Visit Number 3    Number of Visits 6    Authorization Type [employer]BS 2024 30% VL: 30 combined (PT/OT) Auth Not Reqd    OT Start Time 1530    OT Stop Time 1630    OT Time Calculation (min) 60 min    Equipment Utilized During Treatment Paraffin, neoprene/wrist wrap    Activity Tolerance Patient tolerated treatment well    Behavior During Therapy WFL for tasks assessed/performed             Past Medical History:  Diagnosis Date   Arthritis Dx 2006   Frequent UTI    Knee joint disorder    reports gel injections in past   Pneumonia    Yeast infection    Past Surgical History:  Procedure Laterality Date   cyst removed Right 2009    elbow, Dr. Velva    LAPAROSCOPIC ABDOMINAL EXPLORATION     Patient Active Problem List   Diagnosis Date Noted   Aortic atherosclerosis (HCC) 06/13/2022   Abdominal bloating 03/20/2017   Family history of early CAD 04/02/2016   Hypersomnia 09/14/2015   COPD (chronic obstructive pulmonary disease) (HCC) 09/13/2015   Chronic fatigue 05/24/2015   Osteoarthritis 09/19/2014   Smoker 03/27/2014   Low back pain 03/27/2014   Early menopause 03/27/2014    ONSET DATE: 04/06/2023  REFERRING DIAG:  M79.641,G89.29 (ICD-10-CM) - Chronic hand pain, right  M25.531,G89.29 (ICD-10-CM) - Chronic wrist pain, right  THERAPY DIAG:  Pain in right hand  Pain in left hand  Muscle weakness (generalized)  Other lack of coordination  Osteoarthritis of carpometacarpal (CMC) joint of right thumb, unspecified osteoarthritis type  Rationale for Evaluation and Treatment: Rehabilitation  SUBJECTIVE:   SUBJECTIVE STATEMENT:  Pt reports she had pneumonia over the holidays and recently  complete a z -pac.  She did not do much over the holidays because she was sick and her hands have felt better but not 100%.  She did get some help from her friends to make the bows a for her 69+ poinsettias for an order and did not do many bouquets.  Another busy time is coming up with Valentine's day.  She is still not consistently taking any pain relievers as this time as she is worried about the medical side effects ie) liver/heart etc but also concerned that if she doesn't feel pain she might do too much and hurt herself further.  Pt reported that since she was unable to get into her work building last year and had to wait for her husband to open the door knob she has left it unlocked.    Pt had her newest craft/compression gloves on today and had bought a compression glove with longer fingers but it was too small.  Pt accompanied by: self  PERTINENT HISTORY:   X-Ray results completed and read day of OT evaluation: Right wrist:  Severe thumb carpometacarpal joint space narrowing, subchondral sclerosis, subchondral cystic change, and peripheral osteophytosis. Mild triscaphe joint space narrowing. 2 mm ulnar negative variance. Mild distal radioulnar joint space narrowing, subchondral sclerosis, and peripheral spurring.   Right hand: Mild first through fifth interphalangeal joint space narrowing.   No acute fracture or dislocation.   IMPRESSION: 1. Severe  thumb carpometacarpal osteoarthritis. 2. 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 3. Mild first through fifth interphalangeal osteoarthritis.  PRECAUTIONS: None  RED FLAGS: None   WEIGHT BEARING RESTRICTIONS: No  PAIN: Took Melixicom and advil today Are you having pain? Yes: NPRS scale: 2/10 Pain location: base of R thumb but palmar surface Pain description: constant Aggravating factors: working/hurts all the time Relieving factors: wears thermal gloves  She may take something for inflammation ie) Advil  or [employer] brand acetaminophen  - 650mg .  FALLS: Has patient fallen in last 6 months? No  LIVING ENVIRONMENT: Lives with: lives with their spouse Lives in: House Stairs: Yes: External: 3 steps; none Has following equipment at home: None  PLOF: Independent, driving - Van for work, Artist AM for fun  PATIENT GOALS: Pt wants to be able to use her hands to the best of her ability.  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  She is trying to decide what she should do about continuing work.  NEXT MD VISIT: Not yet   OBJECTIVE:  Note: Objective measures were completed at Evaluation unless otherwise noted.  HAND DOMINANCE: Right  ADLs: Pt reports she has some trouble with aspects of ADLs/IADLs such as: -Hard to open stuff -If she has trouble cutting food, her husband helps or she changes item -Cleaning self with toileting - has changed -Pt says she is OK with buttons at this time  FUNCTIONAL OUTCOME MEASURES: Quick Dash: 38.6  UPPER EXTREMITY ROM:    Active ROM Right eval Left eval  Shoulder flexion [facility]  Shoulder abduction    Shoulder adduction    Shoulder extension    Shoulder internal rotation    Shoulder external rotation    Middle trapezius    Lower trapezius    Elbow flexion WFL   Elbow extension   Wrist flexion Slight end range limitations  Wrist extension   Wrist ulnar deviation    Wrist radial deviation    Wrist pronation    Wrist supination    (Blank rows = not tested)   Full composite flexion    UPPER EXTREMITY MMT:    MMT Right eval Left eval  Shoulder flexion 4 4  Shoulder abduction    Shoulder adduction    Shoulder extension    Shoulder internal rotation    Shoulder external rotation    Elbow flexion 4+ 4+  Elbow extension 4+ 4+  Wrist flexion 3- 3-  Wrist extension 3- 3-  Wrist ulnar deviation    Wrist radial deviation    Wrist pronation    Wrist  supination    (Blank rows = not tested)   HAND FUNCTION: Grip strength: Right: 19.1, 20.7, 24.0 lbs; Left: 20.7, 17.1, 21.1 lbs Average: Right: 21.3 lbs Left: 19.6 lbs  COORDINATION: 9 Hole Peg test: Right: 17.86 sec; Left: 20.71 sec  SENSATION: WFL  EDEMA: Occasionally in R hand  OBSERVATIONS: Pt arrived with no AE and no splints/braces in place.  She has an enlarged R CMC joint and is noted to have to shake her hand and put her pen down even with a little bit of writing to take a work order during OT evaluation today.  TODAY'S TREATMENT:  Orthotic Management  Reviewed splinting options for pt as she is using the least restrictive option in a simple craft glove although she did try to get a more protective compression glove.  Reviewed options between wrist and thumb splints as well as possible night time resting hand splint.  OTR ended up fabricating a simple neoprene wrist wrap-like splint to protect wrist and hand for pt to try and explore the changes having her wrist in neutral makes on her work and daily living habits.    Therapeutic Activities  OT educated patient on use of paraffin bath.  Patient was informed of risks and benefits to treatment today and in agreement with trial.  Patient tolerated paraffin bath to R hand with a towel wrap for 10+ minutes during education re: splint options, AE and jt protection principles.  Paraffin Bath was performed in preparation for ROM, strength and functional activities AND as trial for home modality consideration.  No redness, irritation or skin integrity concerns were noted before, during and/or after use.   Skin integrity prior to treatment: Intact. Skin integrity after treatment: Intact.  Information provided about possible home home modality equipment options for paraffin. 4/10 pain at beginning of session and 2/10  at end of session.    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  Also reviewed neutral positioning of hand with wrist wrap principles as noted in pt instructions as needed to improve UE pain.    Patient encouraged to protect hands and wrist by  - Respecting Pain and changing activities before they reach the point of severe discomfort or pain  - Rest and Work Balance ie) balancing activities with appropriate rests, limiting number of options offered ie) at floral job, taking out options or changing price to have pre-fab options for bows, wrapped wires etc - Use of Assistive Equipment/splints - pre previous trial of soft thumb spice splint increasing her discomfort in other areas, pt engaged in trial of more basic wrist support to promote improved hand position for daily tasks.   PATIENT EDUCATION: Education details: Splints, jt protection, AE Person educated: Patient Education method: Explanation, Demonstration, and Verbal cues Education comprehension: verbalized understanding, returned demonstration, verbal cues required, and needs further education  HOME EXERCISE PROGRAM: 04/28/23: Sleep positions, initial jt protection, Tendon gliding exercises  GOALS: Goals reviewed with patient? Yes  SHORT TERM GOALS: Target date: 05/12/23   Patient will demonstrate simple quick break UE HEP for rests during work with 25% verbal cues or less for proper execution. Baseline: New to outpt OT Goal status: IN Progress   2.  Pt to trial increased use of prefab hand splints to help improve pain and improve overall comfort for functional use of BUE. Recommend prefab vs fab due to chronicity of issue, adjustability, comfort, and ease of cleaning. Baseline: CMC OA pain has been worsening Goal status: IN Progress   3.  Pt will independently recall at least 3 joint protection, ergonomics, and body mechanic  principles as noted in pt instructions to assist with daily tasks with increased comfort and confidence.  Baseline:  New to outpt OT Goal status: IN Progress 12/17 - initiated education and provided handouts   4.  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 Baseline: Right: 21.3 lbs Left: 19.6 lbs Goal status: IN  Progress   5.  Pt will independently recall sleep positioning options as noted in pt instructions to improve sleep disturbances from minimal to slight/no difficulty.  Baseline:  Minimal difficulty per QuickDash Goal status: IN Progress 12/17 - initiated education and provided handouts     LONG TERM GOALS: Target date: 06/11/22   Patient will demonstrate updated BUE HEP with visual cues only for proper execution. Baseline: New to outpt OT Goal status: INITIAL   2.  Pt will independently recall at least 3 options for adaptive equipment to assist with joint protection, ergonomics, and body mechanic principles as noted in pt instructions for ADLs and IADLS.  Baseline:  New to outpt OT Goal status: IN Progress   3.  Pt will be independent with home based pain management routine to potentially include gloves/splints, heat and jt protection principles for minimal pain. Baseline: 4/10 at eval but not working Goal status: IN Progress 12/17 - pt has heat pad at home   4.  Patient will demonstrate at improvement with quick Dash score reporting 30% disability or less indicating improved functional use of affected extremity.  Baseline: 38.6 Goal status: INITIAL  ASSESSMENT:  CLINICAL IMPRESSION: Patient is a 58 y.o. female who was seen today for occupational therapy treatment for osteoarthritis affecting BUEs but RUE > LUE especially palmar surface of thumb. Patient did report improvement during downtime over the holidays and session included further education re: jt  protection, adaptive equipment and splinting options. Pt found some relief with paraffin trial but will need to continue to explore non-medicated related pain management with TE, home based pain modalities and appropriate rest/work schedule.  Pt will benefit from further skilled OT services in the outpatient setting to work on impairments as noted to help pt return to max level of independence with access to AE as able.  PERFORMANCE DEFICITS: in functional skills including ADLs, IADLs, coordination, sensation, ROM, strength, pain, Fine motor control, Gross motor control, endurance, decreased knowledge of precautions, decreased knowledge of use of DME, and UE functional use, and psychosocial skills including coping strategies, environmental adaptation, and routines and behaviors.   IMPAIRMENTS: are limiting patient from ADLs, IADLs, rest and sleep, work, leisure, and social participation.   COMORBIDITIES: has co-morbidities such as OA, RA and COPD  that affects occupational performance. Patient will benefit from skilled OT to address above impairments and improve overall function.  REHAB POTENTIAL: Good  PLAN:  OT FREQUENCY: 1x/week  OT DURATION: 6 weeks  PLANNED INTERVENTIONS: 97535 self care/ADL training, 02889 therapeutic exercise, 97530 therapeutic activity, 97018 paraffin, 02960 fluidotherapy, 97760 Splinting (initial encounter), [id number] Subsequent splinting/medication, energy conservation, coping strategies training, patient/family education, and DME and/or AE instructions  RECOMMENDED OTHER SERVICES: May consult with Ortho - Dr. Erwin re: severe CMC OA if necessary  CONSULTED AND AGREED WITH PLAN OF CARE: Patient  PLAN FOR NEXT SESSION:  Check wrist splint trial - wrist v. thumb spica options to help at work Modality trials for home use - paraffin/heat/fluido Further Joint protection education AE education for ADLs and work tasks ie) large handles as comfort allows, bow maker Pt has  joined gym - check HEP   Clarita LITTIE Pride, OT 05/19/2023, 5:17 PM

## 2023-05-26 ENCOUNTER — Ambulatory Visit: Payer: BC Managed Care – PPO | Admitting: Occupational Therapy

## 2023-05-26 DIAGNOSIS — M79642 Pain in left hand: Secondary | ICD-10-CM

## 2023-05-26 DIAGNOSIS — M79641 Pain in right hand: Secondary | ICD-10-CM

## 2023-05-26 DIAGNOSIS — M1811 Unilateral primary osteoarthritis of first carpometacarpal joint, right hand: Secondary | ICD-10-CM | POA: Diagnosis not present

## 2023-05-26 DIAGNOSIS — R278 Other lack of coordination: Secondary | ICD-10-CM | POA: Diagnosis not present

## 2023-05-26 DIAGNOSIS — M6281 Muscle weakness (generalized): Secondary | ICD-10-CM

## 2023-05-26 NOTE — Therapy (Signed)
 OUTPATIENT OCCUPATIONAL THERAPY ORTHO TREATMENT  Patient Name: Cynthia Duran MRN: [id number] DOB:Jan 27, 1966, 58 y.o., female Today's Date: 05/26/2023  PCP: Katrinka Garnette KIDD, MD REFERRING PROVIDER: Marcene Benedict ORN, MD  END OF SESSION:  OT End of Session - 05/26/23 1537     Visit Number 4    Number of Visits 6    Authorization Type BCBS 2024 30% VL: 30 combined (PT/OT) Auth Not Reqd    OT Start Time 1538    OT Stop Time 1635    OT Time Calculation (min) 57 min    Equipment Utilized During Treatment Personal tools, ultrasound    Activity Tolerance Patient tolerated treatment well    Behavior During Therapy [facility] for tasks assessed/performed             Past Medical History:  Diagnosis Date   Arthritis Dx 2006   Frequent UTI    Knee joint disorder    reports gel injections in past   Pneumonia    Yeast infection    Past Surgical History:  Procedure Laterality Date   cyst removed Right 2009    elbow, Dr. Velva    LAPAROSCOPIC ABDOMINAL EXPLORATION     Patient Active Problem List   Diagnosis Date Noted   Aortic atherosclerosis (HCC) 06/13/2022   Abdominal bloating 03/20/2017   Family history of early CAD 04/02/2016   Hypersomnia 09/14/2015   COPD (chronic obstructive pulmonary disease) (HCC) 09/13/2015   Chronic fatigue 05/24/2015   Osteoarthritis 09/19/2014   Smoker 03/27/2014   Low back pain 03/27/2014   Early menopause 03/27/2014    ONSET DATE: 04/06/2023  REFERRING DIAG:  M79.641,G89.29 (ICD-10-CM) - Chronic hand pain, right  M25.531,G89.29 (ICD-10-CM) - Chronic wrist pain, right  THERAPY DIAG:  Pain in right hand  Pain in left hand  Muscle weakness (generalized)  Other lack of coordination  Osteoarthritis of carpometacarpal (CMC) joint of right thumb, unspecified osteoarthritis type  Rationale for Evaluation and Treatment: Rehabilitation  SUBJECTIVE:   SUBJECTIVE STATEMENT:  Pt is still concerned about taking any of the pain medication  with significant side effects for a long period of time.  Pt has been unloading flowers and made 2 arrangements today.  She brought her tools for OT to help problem solve handling issues.   Pt also reported some discomfort in her hands with driving.  Pt reported some comfort from paraffin last week and does want to get one as well as still awaiting some money to help with buying different hand gloves/splints.  Pt accompanied by: self  PERTINENT HISTORY:   X-Ray results completed and read day of OT evaluation: Right wrist:  Severe thumb carpometacarpal joint space narrowing, subchondral sclerosis, subchondral cystic change, and peripheral osteophytosis. Mild triscaphe joint space narrowing. 2 mm ulnar negative variance. Mild distal radioulnar joint space narrowing, subchondral sclerosis, and peripheral spurring.   Right hand: Mild first through fifth interphalangeal joint space narrowing.   No acute fracture or dislocation.   IMPRESSION: 1. Severe thumb carpometacarpal osteoarthritis. 2. 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 3. Mild first through fifth interphalangeal osteoarthritis.  PRECAUTIONS: None  RED FLAGS: None   WEIGHT BEARING RESTRICTIONS: No  PAIN:  Are you having pain? Yes: NPRS scale: 2-4/10 Pain location: base of R thumb but palmar surface Pain description: constant Aggravating factors: working/hurts all the time Relieving factors: wears thermal gloves  She may take something for inflammation ie) Advil or [employer] brand acetaminophen  - 650mg .  FALLS: Has patient fallen in last 6  months? No  LIVING ENVIRONMENT: Lives with: lives with their spouse Lives in: House Stairs: Yes: External: 3 steps; none Has following equipment at home: None  PLOF: Independent, driving - Van for work, Artist AM for fun  PATIENT GOALS: Pt wants to be able to use her hands to the best of her ability.  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  She is trying to decide what she should do about continuing work.  NEXT MD VISIT: Not yet   OBJECTIVE:  Note: Objective measures were completed at Evaluation unless otherwise noted.  HAND DOMINANCE: Right  ADLs: Pt reports she has some trouble with aspects of ADLs/IADLs such as: -Hard to open stuff -If she has trouble cutting food, her husband helps or she changes item -Cleaning self with toileting - has changed -Pt says she is OK with buttons at this time  FUNCTIONAL OUTCOME MEASURES: Quick Dash: 38.6  UPPER EXTREMITY ROM:    Active ROM Right eval Left eval  Shoulder flexion [facility]  Shoulder abduction    Shoulder adduction    Shoulder extension    Shoulder internal rotation    Shoulder external rotation    Middle trapezius    Lower trapezius    Elbow flexion WFL   Elbow extension   Wrist flexion Slight end range limitations  Wrist extension   Wrist ulnar deviation    Wrist radial deviation    Wrist pronation    Wrist supination    (Blank rows = not tested)   Full composite flexion    UPPER EXTREMITY MMT:    MMT Right eval Left eval  Shoulder flexion 4 4  Shoulder abduction    Shoulder adduction    Shoulder extension    Shoulder internal rotation    Shoulder external rotation    Elbow flexion 4+ 4+  Elbow extension 4+ 4+  Wrist flexion 3- 3-  Wrist extension 3- 3-  Wrist ulnar deviation    Wrist radial deviation    Wrist pronation    Wrist supination    (Blank rows = not tested)   HAND FUNCTION: Grip strength: Right: 19.1, 20.7, 24.0 lbs; Left: 20.7, 17.1, 21.1 lbs Average: Right: 21.3 lbs Left: 19.6 lbs  COORDINATION: 9 Hole Peg test: Right: 17.86 sec; Left: 20.71 sec  SENSATION: WFL  EDEMA: Occasionally in R hand  OBSERVATIONS: Pt arrived with no AE and no splints/braces in place.  She has an enlarged R CMC joint and is noted to have to shake her hand and put her  pen down even with a little bit of writing to take a work order during OT evaluation today.  TODAY'S TREATMENT:  Self Care  Pt brought her floral tools from home to problem solve handling issues to try and decrease joint stress and pain.  Continued to review joint protection principles during work related tasks ie) neutral positioning of hand and wrist through different tool options and changing positions of tools as well. Patient encouraged to continue to protect hands and wrist by  - Respecting Pain and changing activities often before the point of severe discomfort or pain  - Rest and Work Balance ie) balancing activities with appropriate rests - Use of Assistive Equipment/splints - prefab splints/wrist supports and modified tools  Personal tool assessment:  -She did apply the blue foam tubing to her small knife which does increase comfort but this does still have her thumb extended out to the side while she cuts towards herself. -She has a large handled delicate pair of scissors which still has some pressure on her thumb but is more comfortable than regular handles.  Reviewed options like spring loaded scissors so she did not have to put her thumb in a handle hole.  She reported she had some but they open too wide and she has to extend her thumb too far to hold them.  Additional online options were explored for self opening scissors. -Pt also had a battery operated cutter she could use to cut thick stems.  The tool was heavy and alternate positions were trialled instead of holding the tool ie) laying cutting end off edge of table, standing it upright on the table as well as repositioning her hand to avoid pressure under her thumb/palmar surface.  The trigger on the toll was quite easy to activate and it is felt that this tool is a good option. -Finally - she reported  having a cutter which an online search called a Guillotine Flower Bunch Cutter.  Pt shown how to minimize tight grasp by pushing handle down with her forearm it possible and options such as coban wrap suggested to build up/pad the handle for safer use   Ultrasound completed for duration as noted below including:  Ultrasound applied to radial right hand, thumb, hand and wrist for 8 minutes, frequency of 3 MHz, 20% duty cycle, and 1.1 W/cm with pt's arm placed on soft towel for promotion of ROM, edema reduction, and pain reduction in affected extremity. Pt had some relief from initial discomfort she arrived with today following ultrasound treatment.   PATIENT EDUCATION: Education details: Jt protection with personal tools for work Person educated: Patient Education method: Programmer, Multimedia, Facilities Manager, and Verbal cues Education comprehension: verbalized understanding, returned demonstration, verbal cues required, and needs further education  HOME EXERCISE PROGRAM: 04/28/23: Sleep positions, initial jt protection, Tendon gliding exercises  GOALS: Goals reviewed with patient? Yes  SHORT TERM GOALS: Target date: 05/12/23   Patient will demonstrate simple quick break UE HEP for rests during work with 25% verbal cues or less for proper execution. Baseline: New to outpt OT Goal status: IN Progress   2.  Pt to trial increased use of prefab hand splints to help improve pain and improve overall comfort for functional use of BUE. Recommend prefab vs fab due to chronicity of issue, adjustability, comfort, and ease of cleaning. Baseline: CMC OA pain has been worsening Goal status: IN Progress   3.  Pt will independently recall at least 3 joint protection, ergonomics, and body mechanic principles as noted in pt instructions to assist with daily tasks with increased comfort and confidence.  Baseline:  New to outpt OT Goal status:  IN Progress 12/17 - initiated education and provided handouts   4.   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 Baseline: Right: 21.3 lbs Left: 19.6 lbs Goal status: IN Progress   5.  Pt will independently recall sleep positioning options as noted in pt instructions to improve sleep disturbances from minimal to slight/no difficulty.  Baseline:  Minimal difficulty per QuickDash Goal status: IN Progress 12/17 - initiated education and provided handouts     LONG TERM GOALS: Target date: 06/11/22   Patient will demonstrate updated BUE HEP with visual cues only for proper execution. Baseline: New to outpt OT Goal status: INITIAL   2.  Pt will independently recall at least 3 options for adaptive equipment to assist with joint protection, ergonomics, and body mechanic principles as noted in pt instructions for ADLs and IADLS.  Baseline:  New to outpt OT Goal status: IN Progress   3.  Pt will be independent with home based pain management routine to potentially include gloves/splints, heat and jt protection principles for minimal pain. Baseline: 4/10 at eval but not working Goal status: IN Progress 12/17 - pt has heat pad at home   4.  Patient will demonstrate at improvement with quick Dash score reporting 30% disability or less indicating improved functional use of affected extremity.  Baseline: 38.6 Goal status: INITIAL  ASSESSMENT:  CLINICAL IMPRESSION: Patient is a 58 y.o. female who was seen today for occupational therapy treatment for osteoarthritis affecting BUEs but RUE > LUE especially palmar surface of thumb. Patient did report improvement following trial of ultrasound today but longer term effects will need to be monitored.  Session continued to focus on jt protection, adaptive equipment and splinting options with use of personal tools for work.  Pt will benefit from further skilled OT services in the outpatient setting to work on impairments  as noted to help pt return to max level of independence with access to AE as able.  PERFORMANCE DEFICITS: in functional skills including ADLs, IADLs, coordination, sensation, ROM, strength, pain, Fine motor control, Gross motor control, endurance, decreased knowledge of precautions, decreased knowledge of use of DME, and UE functional use, and psychosocial skills including coping strategies, environmental adaptation, and routines and behaviors.   IMPAIRMENTS: are limiting patient from ADLs, IADLs, rest and sleep, work, leisure, and social participation.   COMORBIDITIES: has co-morbidities such as OA, RA and COPD  that affects occupational performance. Patient will benefit from skilled OT to address above impairments and improve overall function.  REHAB POTENTIAL: Good  PLAN:  OT FREQUENCY: 1x/week  OT DURATION: 6 weeks  PLANNED INTERVENTIONS: 97535 self care/ADL training, 02889 therapeutic exercise, 97530 therapeutic activity, 97018 paraffin, 02960 fluidotherapy, 97760 Splinting (initial encounter), [id number] Subsequent splinting/medication, energy conservation, coping strategies training, patient/family education, and DME and/or AE instructions  RECOMMENDED OTHER SERVICES: May consult with Ortho - Dr. Erwin re: severe CMC OA if necessary  CONSULTED AND AGREED WITH PLAN OF CARE: Patient  PLAN FOR NEXT SESSION:  Check wrist splint trial - wrist v. thumb spica options to help at work Modality trials for home use - paraffin/heat/fluido/ultrasound Further Joint protection education AE education for ADLs and work tasks ie) large handles as comfort allows, bow maker Pt has joined gym - check HEP  Recert due 06/11/22 s/p 6 sessions   Clarita LITTIE Pride, OT 05/26/2023, 5:22 PM

## 2023-06-02 ENCOUNTER — Ambulatory Visit: Payer: BC Managed Care – PPO | Admitting: Occupational Therapy

## 2023-06-03 ENCOUNTER — Ambulatory Visit: Payer: BC Managed Care – PPO | Admitting: Occupational Therapy

## 2023-06-03 DIAGNOSIS — M79641 Pain in right hand: Secondary | ICD-10-CM | POA: Diagnosis not present

## 2023-06-03 DIAGNOSIS — R278 Other lack of coordination: Secondary | ICD-10-CM

## 2023-06-03 DIAGNOSIS — M79642 Pain in left hand: Secondary | ICD-10-CM | POA: Diagnosis not present

## 2023-06-03 DIAGNOSIS — M1811 Unilateral primary osteoarthritis of first carpometacarpal joint, right hand: Secondary | ICD-10-CM | POA: Diagnosis not present

## 2023-06-03 DIAGNOSIS — M6281 Muscle weakness (generalized): Secondary | ICD-10-CM

## 2023-06-03 NOTE — Therapy (Signed)
OUTPATIENT OCCUPATIONAL THERAPY ORTHO TREATMENT  Patient Name: Cynthia Duran MRN: [id number] DOB:06/08/1965, 58 y.o., female Today's Date: 06/03/2023  PCP: Shelva Majestic, MD REFERRING PROVIDER: Burna Forts, MD  END OF SESSION:  OT End of Session - 06/03/23 1453     Visit Number 5    Number of Visits 6    Authorization Type [employer] 2024 30% VL: 30 combined (PT/OT) Auth Not Reqd    OT Start Time 1453    OT Stop Time 1535    OT Time Calculation (min) 42 min    Equipment Utilized During Treatment Personal tools, ultrasound    Activity Tolerance Patient tolerated treatment well    Behavior During Therapy WFL for tasks assessed/performed             Past Medical History:  Diagnosis Date   Arthritis Dx 2006   Frequent UTI    Knee joint disorder    reports gel injections in past   Pneumonia    Yeast infection    Past Surgical History:  Procedure Laterality Date   cyst removed Right 2009    elbow, Dr. Kelli Hope    LAPAROSCOPIC ABDOMINAL EXPLORATION     Patient Active Problem List   Diagnosis Date Noted   Aortic atherosclerosis (HCC) 06/13/2022   Abdominal bloating 03/20/2017   Family history of early CAD 04/02/2016   Hypersomnia 09/14/2015   COPD (chronic obstructive pulmonary disease) (HCC) 09/13/2015   Chronic fatigue 05/24/2015   Osteoarthritis 09/19/2014   Smoker 03/27/2014   Low back pain 03/27/2014   Early menopause 03/27/2014    ONSET DATE: 04/06/2023  REFERRING DIAG:  M79.641,G89.29 (ICD-10-CM) - Chronic hand pain, right  M25.531,G89.29 (ICD-10-CM) - Chronic wrist pain, right  THERAPY DIAG:  Pain in right hand  Osteoarthritis of carpometacarpal (CMC) joint of right thumb, unspecified osteoarthritis type  Muscle weakness (generalized)  Other lack of coordination  Rationale for Evaluation and Treatment: Rehabilitation  SUBJECTIVE:   SUBJECTIVE STATEMENT:  Pt apologized for missing appt yesterday due to family issue and time getting away  from her.  She reported that when she takes the Meloxicam it has been keeping her awake.  Pt reported good comfort and relief with use of ultrasound last week, especially in comparison to using the paraffin.  In addition, she reports using the heating pad quite regularly recently.    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  She brought additional tools for OT to help problem solve handling issues with the spring loaded scissors.   Pt accompanied by: self  PERTINENT HISTORY:   X-Ray results completed and read day of OT evaluation: Right wrist:  Severe thumb carpometacarpal joint space narrowing, subchondral sclerosis, subchondral cystic change, and peripheral osteophytosis. Mild triscaphe joint space narrowing. 2 mm ulnar negative variance. Mild distal radioulnar joint space narrowing, subchondral sclerosis, and peripheral spurring.   Right hand: Mild first through fifth interphalangeal joint space narrowing.   No acute fracture or dislocation.   IMPRESSION: 1. Severe thumb carpometacarpal osteoarthritis. 2. 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 3. Mild first through fifth interphalangeal osteoarthritis.  PRECAUTIONS: None  RED FLAGS: None   WEIGHT BEARING RESTRICTIONS: No  PAIN:  Are you having pain? Yes: NPRS scale: 5/10 Pain location: base of R thumb but palmar surface Pain description: constant Aggravating factors: working today/hurts all the time Relieving factors: wears thermal gloves  She may take something for inflammation ie) Advil or [employer] brand acetaminophen -  650mg .  FALLS: Has patient fallen in last 6 months? No  LIVING ENVIRONMENT: Lives with: lives with their spouse Lives in: House Stairs: Yes: External: 3 steps; none Has following equipment at home: None  PLOF: Independent, driving - Van for work, Artist AM for fun  PATIENT GOALS: Pt wants to be able to use her hands to the best of her ability.   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  She is trying to decide what she should do about continuing work.  NEXT MD VISIT: Not yet   OBJECTIVE:  Note: Objective measures were completed at Evaluation unless otherwise noted.  HAND DOMINANCE: Right  ADLs: Pt reports she has some trouble with aspects of ADLs/IADLs such as: -Hard to open stuff -If she has trouble cutting food, her husband helps or she changes item -Cleaning self with toileting - "has changed" -Pt says she is OK with buttons at this time  FUNCTIONAL OUTCOME MEASURES: Quick Dash: 38.6  UPPER EXTREMITY ROM:    Active ROM Right eval Left eval  Shoulder flexion [facility]  Shoulder abduction    Shoulder adduction    Shoulder extension    Shoulder internal rotation    Shoulder external rotation    Middle trapezius    Lower trapezius    Elbow flexion WFL   Elbow extension   Wrist flexion Slight end range limitations  Wrist extension   Wrist ulnar deviation    Wrist radial deviation    Wrist pronation    Wrist supination    (Blank rows = not tested)   Full composite flexion    UPPER EXTREMITY MMT:    MMT Right eval Left eval  Shoulder flexion 4 4  Shoulder abduction    Shoulder adduction    Shoulder extension    Shoulder internal rotation    Shoulder external rotation    Elbow flexion 4+ 4+  Elbow extension 4+ 4+  Wrist flexion 3- 3-  Wrist extension 3- 3-  Wrist ulnar deviation    Wrist radial deviation    Wrist pronation    Wrist supination    (Blank rows = not tested)   HAND FUNCTION: Grip strength: Right: 19.1, 20.7, 24.0 lbs; Left: 20.7, 17.1, 21.1 lbs Average: Right: 21.3 lbs Left: 19.6 lbs  COORDINATION: 9 Hole Peg test: Right: 17.86 sec; Left: 20.71 sec  SENSATION: WFL  EDEMA: Occasionally in R hand  OBSERVATIONS: Pt arrived with no AE and no splints/braces in place.  She has an enlarged R CMC  joint and is noted to have to shake her hand and put her pen down even with a little bit of writing to take a work order during OT evaluation today.  TODAY'S TREATMENT:  Therapeutic Activities  Pt has trialled different splints etc for thumb support and OTR trialled Coban wrap with stabilization at wrist and base of CMC joint with pt reporting some increased support, ease of handling her scissor tools and functional use of all digits and thumb for grasp, pinch and use of hand during table top tasks. Pt to wear wrap home after therapy today and try it with remainder of her work tasks today.   Pt brought her floral tools from home to problem solve handling issues to try and decrease joint stress and pain.   -She still prefers the large handled delicate pair of scissors which still has some pressure on the webspace of her thumb but is more comfortable than regular handles.  Trialled spring loaded scissors today but it still applies pressure to the palmar aspect of her thumb which is where most of her discomfort is - not the back of the [facility] joint  -Pt has trialled different position of her battery operated cutter she could use to cut thick stems to avoid pressure under her thumb/palmar surface as trialled last week with some success.  Ultrasound completed for duration as noted below including:  Ultrasound applied to radial right hand, thumb, hand and wrist for 8 minutes, frequency of 3 MHz, 20% duty cycle, and 1.1 W/cm with pt's arm placed on soft towel for promotion of ROM, edema reduction, and pain reduction in affected extremity. Pt had some relief again today following ultrasound treatment.   PATIENT EDUCATION: Education details: Jt protection with personal tools for work Person educated: Patient Education method: Programmer, multimedia, Facilities manager, and Verbal cues Education  comprehension: verbalized understanding, returned demonstration, verbal cues required, and needs further education  HOME EXERCISE PROGRAM: 04/28/23: Sleep positions, initial jt protection, Tendon gliding exercises  GOALS: Goals reviewed with patient? Yes  SHORT TERM GOALS: Target date: 05/12/23   Patient will demonstrate simple quick break UE HEP for rests during work with 25% verbal cues or less for proper execution. Baseline: New to outpt OT Goal status: IN Progress   2.  Pt to trial increased use of prefab hand splints to help improve pain and improve overall comfort for functional use of BUE. Recommend prefab vs fab due to chronicity of issue, adjustability, comfort, and ease of cleaning. Baseline: CMC OA pain has been worsening Goal status: IN Progress   3.  Pt will independently recall at least 3 joint protection, ergonomics, and body mechanic principles as noted in pt instructions to assist with daily tasks with increased comfort and confidence.  Baseline:  New to outpt OT Goal status: IN Progress 12/17 - initiated education and provided handouts   4.  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 Baseline: Right: 21.3 lbs Left: 19.6 lbs Goal status: IN Progress   5.  Pt will independently recall sleep positioning options as noted in pt instructions to improve sleep disturbances from minimal to slight/no difficulty.  Baseline:  Minimal difficulty per QuickDash Goal status: IN Progress 12/17 - initiated education and provided handouts     LONG TERM GOALS: Target date: 06/11/22   Patient will demonstrate updated BUE HEP with visual cues only for proper execution. Baseline: New to outpt OT Goal status: INITIAL   2.  Pt will independently recall at least 3 options for adaptive equipment to assist with joint protection, ergonomics, and body mechanic principles as noted in  pt instructions for ADLs and IADLS.  Baseline:  New to outpt OT Goal status: IN Progress   3.  Pt will be independent with home based pain management routine to potentially include gloves/splints, heat and jt protection principles for minimal pain. Baseline: 4/10 at eval but not working Goal status: IN Progress 12/17 - pt has heat pad at home   4.  Patient will demonstrate at improvement with quick Dash score reporting 30% disability or less indicating improved functional use of affected extremity.  Baseline: 38.6 Goal status: INITIAL  ASSESSMENT:  CLINICAL IMPRESSION: Patient is a 58 y.o. female who was seen today for occupational therapy treatment for osteoarthritis affecting BUEs but RUE > LUE especially palmar surface of thumb. Patient did report improvement following trial of ultrasound today but longer term effects will need to be monitored.  Session continued to focus on jt protection, adaptive equipment and splinting options with use of personal tools for work.  Pt will benefit from further skilled OT services in the outpatient setting to work on impairments as noted to help pt return to max level of independence with access to AE as able.  PERFORMANCE DEFICITS: in functional skills including ADLs, IADLs, coordination, sensation, ROM, strength, pain, Fine motor control, Gross motor control, endurance, decreased knowledge of precautions, decreased knowledge of use of DME, and UE functional use, and psychosocial skills including coping strategies, environmental adaptation, and routines and behaviors.   IMPAIRMENTS: are limiting patient from ADLs, IADLs, rest and sleep, work, leisure, and social participation.   COMORBIDITIES: has co-morbidities such as OA, RA and COPD  that affects occupational performance. Patient will benefit from skilled OT to address above impairments and improve overall function.  REHAB POTENTIAL: Good  PLAN:  OT FREQUENCY: 1x/week  OT DURATION: 6  weeks  PLANNED INTERVENTIONS: 97535 self care/ADL training, 86578 therapeutic exercise, 97530 therapeutic activity, 97018 paraffin, 46962 fluidotherapy, 97760 Splinting (initial encounter), [id number] Subsequent splinting/medication, energy conservation, coping strategies training, patient/family education, and DME and/or AE instructions  RECOMMENDED OTHER SERVICES: May consult with Ortho - Dr. Fara Boros re: severe CMC OA if necessary  CONSULTED AND AGREED WITH PLAN OF CARE: Patient  PLAN FOR NEXT SESSION:  Check wrist splint trial - wrist v. thumb spica options to help at work Modality trials for home use - heat/ultrasound Further Joint protection education AE education for ADLs and work tasks ie) large handles as comfort allows, bow maker Pt has joined gym - check HEP  Recert due 06/11/22 s/p 6 sessions   Victorino Sparrow, OT 06/03/2023, 5:12 PM

## 2023-06-09 ENCOUNTER — Ambulatory Visit: Payer: BC Managed Care – PPO | Admitting: Occupational Therapy

## 2023-06-09 DIAGNOSIS — R278 Other lack of coordination: Secondary | ICD-10-CM | POA: Diagnosis not present

## 2023-06-09 DIAGNOSIS — M6281 Muscle weakness (generalized): Secondary | ICD-10-CM | POA: Diagnosis not present

## 2023-06-09 DIAGNOSIS — M79642 Pain in left hand: Secondary | ICD-10-CM | POA: Diagnosis not present

## 2023-06-09 DIAGNOSIS — M79641 Pain in right hand: Secondary | ICD-10-CM

## 2023-06-09 DIAGNOSIS — M1811 Unilateral primary osteoarthritis of first carpometacarpal joint, right hand: Secondary | ICD-10-CM

## 2023-06-09 NOTE — Therapy (Signed)
OUTPATIENT OCCUPATIONAL THERAPY ORTHO TREATMENT & PROGRESS NOTE  Patient Name: Cynthia Duran MRN: [id number] DOB:February 07, 1966, 58 y.o., female Today's Date: 06/09/2023  PCP: Shelva Majestic, MD REFERRING PROVIDER: Burna Forts, MD  END OF SESSION:  OT End of Session - 06/09/23 1532     Visit Number 6    Number of Visits 12    Authorization Type [employer]BS 2024 30% VL: 30 combined (PT/OT) Auth Not Reqd    OT Start Time 1533    OT Stop Time 1626    OT Time Calculation (min) 53 min    Equipment Utilized During Treatment ultrasound    Activity Tolerance Patient limited by pain    Behavior During Therapy [facility] for tasks assessed/performed             Past Medical History:  Diagnosis Date   Arthritis Dx 2006   Frequent UTI    Knee joint disorder    reports gel injections in past   Pneumonia    Yeast infection    Past Surgical History:  Procedure Laterality Date   cyst removed Right 2009    elbow, Dr. Kelli Hope    LAPAROSCOPIC ABDOMINAL EXPLORATION     Patient Active Problem List   Diagnosis Date Noted   Aortic atherosclerosis (HCC) 06/13/2022   Abdominal bloating 03/20/2017   Family history of early CAD 04/02/2016   Hypersomnia 09/14/2015   COPD (chronic obstructive pulmonary disease) (HCC) 09/13/2015   Chronic fatigue 05/24/2015   Osteoarthritis 09/19/2014   Smoker 03/27/2014   Low back pain 03/27/2014   Early menopause 03/27/2014    ONSET DATE: 04/06/2023  REFERRING DIAG:  M79.641,G89.29 (ICD-10-CM) - Chronic hand pain, right  M25.531,G89.29 (ICD-10-CM) - Chronic wrist pain, right  THERAPY DIAG:  Pain in right hand  Pain in left hand  Muscle weakness (generalized)  Other lack of coordination  Osteoarthritis of carpometacarpal (CMC) joint of right thumb, unspecified osteoarthritis type  Rationale for Evaluation and Treatment: Rehabilitation  SUBJECTIVE:   SUBJECTIVE STATEMENT:  Pt reports that she has been having some back issues for a few days  (since Wed/Thurs of last week) but she is not sure what aggravated it.  Pt has been unloading flowers and make 2+ arrangements/day this week.  She is also noticing difficulty with writing.   Pt has been taking Advil daily but hasn't notice much difference in the pain in her hands or her back.  She also noted that the L hand has been hurting more this week.    Pt reported some ongoing comfort and relief with use of ultrasound again last week.    Pt also reported reviewing her bone scan which indicated she had osteoporosis.  Pt accompanied by: self  PERTINENT HISTORY:   X-Ray results completed and read day of OT evaluation: Right wrist:  Severe thumb carpometacarpal joint space narrowing, subchondral sclerosis, subchondral cystic change, and peripheral osteophytosis. Mild triscaphe joint space narrowing. 2 mm ulnar negative variance. Mild distal radioulnar joint space narrowing, subchondral sclerosis, and peripheral spurring.   Right hand: Mild first through fifth interphalangeal joint space narrowing.   No acute fracture or dislocation.   IMPRESSION: 1. Severe thumb carpometacarpal osteoarthritis. 2. 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 3. Mild first through fifth interphalangeal osteoarthritis.  PRECAUTIONS: None  RED FLAGS: None   WEIGHT BEARING RESTRICTIONS: No  PAIN:  Are you having pain? Yes: NPRS scale: 8/10 Pain location: base of R thumb palmar surface, hands, shoulders and back Pain description: constant Aggravating  factors: working today/hurts all the time Relieving factors: wears thermal gloves  She may take something for inflammation ie) Advil or [employer] brand acetaminophen - 650mg .  FALLS: Has patient fallen in last 6 months? No  LIVING ENVIRONMENT: Lives with: lives with their spouse Lives in: House Stairs: Yes: External: 3 steps; none Has following equipment at home: None  PLOF: Independent, driving - Van for work, Artist AM for  fun  PATIENT GOALS: Pt wants to be able to use her hands to the best of her ability.  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  She is trying to decide what she should do about continuing work.  NEXT MD VISIT: Not yet   OBJECTIVE:  Note: Objective measures were completed at Evaluation unless otherwise noted.  HAND DOMINANCE: Right  ADLs: Pt reports she has some trouble with aspects of ADLs/IADLs such as: -Hard to open stuff -If she has trouble cutting food, her husband helps or she changes item -Cleaning self with toileting - "has changed" -Pt says she is OK with buttons at this time  FUNCTIONAL OUTCOME MEASURES: Eval 04/15/23 Quick Dash: 38.6 Recert 06/09/23 Quick Dash 45.5   UPPER EXTREMITY ROM:    Active ROM Right eval Left eval  Shoulder flexion [facility] [address] [facility]  Shoulder abduction    Shoulder adduction    Shoulder extension    Shoulder internal rotation    Shoulder external rotation    Middle trapezius    Lower trapezius    Elbow flexion WFL   Elbow extension   Wrist flexion Slight end range limitations  Wrist extension   Wrist ulnar deviation    Wrist radial deviation    Wrist pronation    Wrist supination    (Blank rows = not tested)   Full composite flexion    UPPER EXTREMITY MMT:    MMT Right eval Left eval  Shoulder flexion 4 4  Shoulder abduction    Shoulder adduction    Shoulder extension    Shoulder internal rotation    Shoulder external rotation    Elbow flexion 4+ 4+  Elbow extension 4+ 4+  Wrist flexion 3- 3-  Wrist extension 3- 3-  Wrist ulnar deviation    Wrist radial deviation    Wrist pronation    Wrist supination    (Blank rows = not tested)   HAND FUNCTION: Grip strength: Right: 19.1, 20.7, 24.0 lbs; Left: 20.7, 17.1, 21.1 lbs Average: Right: 21.3 lbs Left: 19.6 lbs  COORDINATION: 9 Hole Peg test: Right: 17.86 sec; Left: 20.71  sec  SENSATION: WFL  EDEMA: Occasionally in R hand  OBSERVATIONS: Pt arrived with no AE and no splints/braces in place.  She has an enlarged R CMC joint and is noted to have to shake her hand and put her pen down even with a little bit of writing to take a work order during OT evaluation today.  TODAY'S TREATMENT:  Therapeutic Activities  Pt had her results of bone density scan and was assisted to compare results to 2015 results ie) 2015 revealed osteopenia and 2024 results are osteoporosis.  Recommendation found in her MyChart are to look for calcium/vitamin D supplement and weight bearing activities.  Pt safety education provided re: decreasing fall risk as able to prevent fracture due to higher risk with OP.   Pt has trialled different splints etc for thumb support and OTR trialled Coban wrap over her compression gloves with increased stabilization at wrist and base of CMC joint.  This was different than just wrapping area like was done last week and pt was going to leave the wrap in place for increased support upon return to work to prepare an arrangement at home.    Pt about to enter her busy season with Valentine's day coming up and education provided on taking breaks with stretches, light ROM as well as modifying grasp, getting help as available and trying increased stability of thumb/wrist/hand via wrapping or splint as well as regular use of heat pad at home etc.   Ultrasound completed for duration as noted below including:  Ultrasound applied to radial right hand, thumb, hand and wrist for 8 minutes, frequency of 3 MHz, 20% duty cycle, and 1.1 W/cm with pt's arm placed on soft towel for promotion of ROM, edema reduction, and pain reduction in affected extremity. Pt needed frequent cues to relax arm during [place] tx and had some relief again today following  ultrasound treatment.   PATIENT EDUCATION: Education details: Jt protection  Person educated: Patient Education method: Explanation, Demonstration, and Verbal cues Education comprehension: verbalized understanding, returned demonstration, verbal cues required, and needs further education  HOME EXERCISE PROGRAM: 04/28/23: Sleep positions, initial jt protection, Tendon gliding exercises  GOALS: Goals reviewed with patient? Yes  SHORT TERM GOALS: Target date: 07/10/23   Patient will demonstrate simple quick break UE HEP for rests during work with 25% verbal cues or less for proper execution. Baseline: New to outpt OT Goal status: MET   2.  Pt to trial increased use of prefab hand splints to help improve pain and improve overall comfort for functional use of BUE. Recommend prefab vs fab due to chronicity of issue, adjustability, comfort, and ease of cleaning. Baseline: CMC OA pain has been worsening Goal status: IN Progress 06/09/23 - Various items trialled but hands get wet and she has to take them off at work and hasn't found the most comfortable option   3.  Pt will independently recall at least 3 joint protection, ergonomics, and body mechanic principles as noted in pt instructions to assist with daily tasks with increased comfort and confidence.  Baseline:  New to outpt OT Goal status: MET 12/17 - initiated education and provided handouts 06/09/23 - Pt brought tools in previous session to assess and modify for max comfort.   4.  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 Baseline: Right: 21.3 lbs Left: 19.6 lbs Goal status: IN Progress - not retested due to pain   5.  Pt will independently recall sleep positioning options as noted in pt instructions to improve sleep disturbances from minimal to slight/no difficulty.  Baseline:  Minimal difficulty per QuickDash Goal status:  MET 12/17 - initiated education and provided handouts     LONG TERM GOALS: Target date: 08/07/23   Patient will demonstrate updated BUE  HEP with visual cues only for proper execution. Baseline: New to outpt OT Goal status: In Progress   2.  Pt will independently recall at least 3 options for adaptive equipment to assist with joint protection, ergonomics, and body mechanic principles as noted in pt instructions for ADLs and IADLS.  Baseline:  New to outpt OT Goal status: MET 06/09/23 - Pt brought tools in previous session to assess and modify for max comfort.   3.  Pt will be independent with home based pain management routine to potentially include gloves/splints, heat and jt protection principles for minimal pain. Baseline: 4/10 at eval but not working Goal status: IN Progress 12/17 - pt has heat pad at home 06/09/23 - pt at 8/10 with work today, some comfort with ultrasound and heat   4.  Patient will demonstrate at improvement with quick Dash score reporting 30% disability or less indicating improved functional use of affected extremity.  Baseline: 38.6 Goal status: IN Progress 06/09/23 - 45.5 - pt is more impaired but has been working more than around time of eval  ASSESSMENT:  CLINICAL IMPRESSION: Patient is a 58 y.o. female who was seen today for occupational therapy treatment for osteoarthritis affecting BUEs but RUE > LUE especially palmar surface of thumb with some increased pain in LUE this past week. Patient did report improvement following trials of ultrasound with longer term effects still to be monitored.  Session continued to focus on jt protection, adaptive equipment and splinting options with use of personal tools for work.    This  progress note is for dates: 04/15/23 to 06/09/2023. Pt has met 3/5 STGs and 1/4 LTGs. Pt making progress towards goals but slower than expected but continues to benefit from skilled OT services in the outpatient setting to work towards remaining  goals or until max rehab potential is met.   PERFORMANCE DEFICITS: in functional skills including ADLs, IADLs, coordination, sensation, ROM, strength, pain, Fine motor control, Gross motor control, endurance, decreased knowledge of precautions, decreased knowledge of use of DME, and UE functional use, and psychosocial skills including coping strategies, environmental adaptation, and routines and behaviors.   IMPAIRMENTS: are limiting patient from ADLs, IADLs, rest and sleep, work, leisure, and social participation.   COMORBIDITIES: has co-morbidities such as OA, RA and COPD  that affects occupational performance. Patient will benefit from skilled OT to address above impairments and improve overall function.  REHAB POTENTIAL: Good  PLAN:  OT FREQUENCY: 1x/week  OT DURATION: additional 6 weeks - POC extended to cover scheduling with busy work season for patient  PLANNED INTERVENTIONS: 97535 self care/ADL training, 16109 therapeutic exercise, 97530 therapeutic activity, 97018 paraffin, 60454 fluidotherapy, 97760 Splinting (initial encounter), [phone] Subsequent splinting/medication, energy conservation, coping strategies training, patient/family education, and DME and/or AE instructions  RECOMMENDED OTHER SERVICES: May consult with Ortho - Dr. Fara Boros re: severe CMC OA if necessary  CONSULTED AND AGREED WITH PLAN OF CARE: Patient  PLAN FOR NEXT SESSION:  Check wrist splint trial - wrist v. thumb spica options to help at work Modality trials for home use - heat/ultrasound Further Joint protection education AE education for ADLs and work tasks ie) large handles as comfort allows, bow maker   Victorino Sparrow, OT 06/09/2023, 5:07 PM

## 2023-06-16 ENCOUNTER — Ambulatory Visit
Admission: RE | Admit: 2023-06-16 | Discharge: 2023-06-16 | Disposition: A | Discharge status: Home / Self Care | Payer: BC Managed Care – PPO | Source: Ambulatory Visit | Attending: Acute Care | Admitting: Acute Care

## 2023-06-16 DIAGNOSIS — Z87891 Personal history of nicotine dependence: Secondary | ICD-10-CM | POA: Diagnosis not present

## 2023-06-16 DIAGNOSIS — Z122 Encounter for screening for malignant neoplasm of respiratory organs: Secondary | ICD-10-CM

## 2023-06-16 DIAGNOSIS — F17210 Nicotine dependence, cigarettes, uncomplicated: Secondary | ICD-10-CM

## 2023-06-26 ENCOUNTER — Other Ambulatory Visit: Payer: Self-pay | Admitting: Acute Care

## 2023-06-26 DIAGNOSIS — Z122 Encounter for screening for malignant neoplasm of respiratory organs: Secondary | ICD-10-CM

## 2023-06-26 DIAGNOSIS — Z87891 Personal history of nicotine dependence: Secondary | ICD-10-CM

## 2023-06-30 ENCOUNTER — Ambulatory Visit: Payer: BC Managed Care – PPO | Attending: Family Medicine | Admitting: Occupational Therapy

## 2023-06-30 DIAGNOSIS — M1811 Unilateral primary osteoarthritis of first carpometacarpal joint, right hand: Secondary | ICD-10-CM | POA: Diagnosis not present

## 2023-06-30 DIAGNOSIS — M79641 Pain in right hand: Secondary | ICD-10-CM | POA: Insufficient documentation

## 2023-06-30 DIAGNOSIS — M79642 Pain in left hand: Secondary | ICD-10-CM | POA: Diagnosis not present

## 2023-06-30 NOTE — Therapy (Unsigned)
OUTPATIENT OCCUPATIONAL THERAPY ORTHO TREATMENT  Patient Name: Cynthia Duran MRN: [id number] DOB:1965/07/04, 58 y.o., female Today's Date: 06/30/2023  PCP: Shelva Majestic, MD REFERRING PROVIDER: Burna Forts, MD  END OF SESSION:  OT End of Session - 06/30/23 1451     Visit Number 7    Number of Visits 12    Authorization Type [employer]BS 2024 30% VL: 30 combined (PT/OT) Auth Not Reqd    OT Start Time 1450    OT Stop Time 1545    OT Time Calculation (min) 55 min    Equipment Utilized During Database administrator    Activity Tolerance Patient tolerated treatment well    Behavior During Therapy WFL for tasks assessed/performed             Past Medical History:  Diagnosis Date   Arthritis Dx 2006   Frequent UTI    Knee joint disorder    reports gel injections in past   Pneumonia    Yeast infection    Past Surgical History:  Procedure Laterality Date   cyst removed Right 2009    elbow, Dr. Kelli Hope    LAPAROSCOPIC ABDOMINAL EXPLORATION     Patient Active Problem List   Diagnosis Date Noted   Aortic atherosclerosis (HCC) 06/13/2022   Abdominal bloating 03/20/2017   Family history of early CAD 04/02/2016   Hypersomnia 09/14/2015   COPD (chronic obstructive pulmonary disease) (HCC) 09/13/2015   Chronic fatigue 05/24/2015   Osteoarthritis 09/19/2014   Smoker 03/27/2014   Low back pain 03/27/2014   Early menopause 03/27/2014    ONSET DATE: 04/06/2023  REFERRING DIAG:  M79.641,G89.29 (ICD-10-CM) - Chronic hand pain, right  M25.531,G89.29 (ICD-10-CM) - Chronic wrist pain, right  THERAPY DIAG:  Pain in right hand  Pain in left hand  Osteoarthritis of carpometacarpal (CMC) joint of right thumb, unspecified osteoarthritis type  Rationale for Evaluation and Treatment: Rehabilitation  SUBJECTIVE:   SUBJECTIVE STATEMENT:  Pt reports that she made it through the Valentines floral rush with support from family etc. She would take advil x 3 (inflammation)  and tylenol x2 (pain) daily to help with the discomfort/pain and uses her heating pad regularly.  She also reported significant improvement with the warmer day last week ie) minimal discomfort. She also reported getting some relief when her husband would massage her hands ie) "it hurts so good."  Pt accompanied by: self  PERTINENT HISTORY:   X-Ray results completed and read day of OT evaluation: Right wrist:  Severe thumb carpometacarpal joint space narrowing, subchondral sclerosis, subchondral cystic change, and peripheral osteophytosis. Mild triscaphe joint space narrowing. 2 mm ulnar negative variance. Mild distal radioulnar joint space narrowing, subchondral sclerosis, and peripheral spurring.   Right hand: Mild first through fifth interphalangeal joint space narrowing.   No acute fracture or dislocation.   IMPRESSION: 1. Severe thumb carpometacarpal osteoarthritis. 2. 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 3. Mild first through fifth interphalangeal osteoarthritis.  PRECAUTIONS: None  RED FLAGS: None   WEIGHT BEARING RESTRICTIONS: No  PAIN:  Are you having pain? Yes: NPRS scale: 8/10 Pain location: base of R thumb palmar surface, hands, shoulders and back Pain description: constant Aggravating factors: working today/hurts all the time Relieving factors: wears thermal gloves  She may take something for inflammation ie) Advil or [employer] brand acetaminophen - 650mg .  FALLS: Has patient fallen in last 6 months? No  LIVING ENVIRONMENT: Lives with: lives with their spouse Lives in: House Stairs: Yes: External: 3 steps; none  Has following equipment at home: None  PLOF: Independent, driving - Van for work, [employer] AM for fun  PATIENT GOALS: Pt wants to be able to use her hands to the best of her ability.  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  She is trying to decide  what she should do about continuing work.  NEXT MD VISIT: Not yet   OBJECTIVE:  Note: Objective measures were completed at Evaluation unless otherwise noted.  HAND DOMINANCE: Right  ADLs: Pt reports she has some trouble with aspects of ADLs/IADLs such as: -Hard to open stuff -If she has trouble cutting food, her husband helps or she changes item -Cleaning self with toileting - "has changed" -Pt says she is OK with buttons at this time  FUNCTIONAL OUTCOME MEASURES: Eval 04/15/23 Quick Dash: 38.6 Recert 06/09/23 Quick Dash 45.5   UPPER EXTREMITY ROM:    Active ROM Right eval Left eval  Shoulder flexion [facility]  Shoulder abduction    Shoulder adduction    Shoulder extension    Shoulder internal rotation    Shoulder external rotation    Middle trapezius    Lower trapezius    Elbow flexion WFL   Elbow extension   Wrist flexion Slight end range limitations  Wrist extension   Wrist ulnar deviation    Wrist radial deviation    Wrist pronation    Wrist supination    (Blank rows = not tested)   Full composite flexion    UPPER EXTREMITY MMT:    MMT Right eval Left eval  Shoulder flexion 4 4  Shoulder abduction    Shoulder adduction    Shoulder extension    Shoulder internal rotation    Shoulder external rotation    Elbow flexion 4+ 4+  Elbow extension 4+ 4+  Wrist flexion 3- 3-  Wrist extension 3- 3-  Wrist ulnar deviation    Wrist radial deviation    Wrist pronation    Wrist supination    (Blank rows = not tested)   HAND FUNCTION: Grip strength: Right: 19.1, 20.7, 24.0 lbs; Left: 20.7, 17.1, 21.1 lbs Average: Right: 21.3 lbs Left: 19.6 lbs  COORDINATION: 9 Hole Peg test: Right: 17.86 sec; Left: 20.71 sec  SENSATION: WFL  EDEMA: Occasionally in R hand  OBSERVATIONS: Pt arrived with no AE and no splints/braces in place.  She has an enlarged R CMC joint and is noted to have to shake her hand and put her pen down even with a little bit of writing to  take a work order during OT evaluation today.  TODAY'S TREATMENT:                                                                                                                               Due to patient's report of improvement with massage, OTR  trialled manual techniques used to manipulate tight tissues and thumb joint. Soft tissue mobilization, deep tissue massage/pressure and stretching used to increase tissue extensibility and increase muscular relaxation with ultimate goal of improved ROM and decreased pain.  OTR also trialled vibration tool with pt able to utilize device for self management of pain also.  In addition, OTR fabricated a small dome shape out of thermoplastic material to allow pt to place her hand atop and then press with opposite forearm for the pressure that she finds beneficial in relieving her discomfort.  Pt also assisted to look up mini massager for home use and although there seems to be a heated option, it was difficult to find an affordable small version.  OTR/pt to continue looking for appropriate options.  Self Care:  Continued pt education and planning for pain control options etc.  Pt is progressing with independent identification of options to address pain and joint protection principles.   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  She has had her tools sharpened to increase ease with cutting flowers and/or let someone else cut the stems.  She is not moving the heavy water buckets on her own, is paying attention to how she grabs the vases and is consistently using 2 hands.  Reviewed pain r/t OA including why pt had better comfort during warmer days recently ie) -warm weather can indeed make OA symptoms feel better, particularly when combined with low humidity, as warmer temperatures can help relax muscles, improve blood flow, and reduce joint stiffness and pain Key points about warm weather and arthritis: -Reduced joint stiffness: -Warmth can help loosen  tight muscles and joints, leading to less stiffness and pain.  -Improved blood flow: -Warmer temperatures can increase blood circulation to the joints, potentially reducing inflammation.  Dry weather benefit: -Low humidity is often considered better for arthritis than high humidity, as moisture can exacerbate joint swelling.  Type of arthritis: -The impact of weather on arthritis pain can vary depending on the type of arthritis, with osteoarthritis often showing more benefit from warm weather compared to rheumatoid arthritis.  Humidity factor: -While warmth can be beneficial, high humidity can negate the positive effects and worsen pain.     PATIENT EDUCATION: Education details: Pain management  Person educated: Patient Education method: Explanation, Demonstration, and Verbal cues Education comprehension: verbalized understanding, returned demonstration, verbal cues required, and needs further education  HOME EXERCISE PROGRAM: 04/28/23: Sleep positions, initial jt protection, Tendon gliding exercises  GOALS: Goals reviewed with patient? Yes  SHORT TERM GOALS: Target date: 07/10/23   Patient will demonstrate simple quick break UE HEP for rests during work with 25% verbal cues or less for proper execution. Baseline: New to outpt OT Goal status: MET   2.  Pt to trial increased use of prefab hand splints to help improve pain and improve overall comfort for functional use of BUE. Recommend prefab vs fab due to chronicity of issue, adjustability, comfort, and ease of cleaning. Baseline: CMC OA pain has been worsening Goal status: IN Progress 06/09/23 - Various items trialled but hands get wet and she has to take them off at work and hasn't found the most comfortable option   3.  Pt will independently recall at least 3 joint protection, ergonomics, and body mechanic principles as noted in pt instructions to assist with daily tasks with increased comfort and confidence.  Baseline:  New to  outpt OT Goal status: MET 12/17 - initiated education and provided handouts 06/09/23 -  Pt brought tools in previous session to assess and modify for max comfort.   4.  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 Baseline: Right: 21.3 lbs Left: 19.6 lbs Goal status: IN Progress - not retested due to pain   5.  Pt will independently recall sleep positioning options as noted in pt instructions to improve sleep disturbances from minimal to slight/no difficulty.  Baseline:  Minimal difficulty per QuickDash Goal status: MET 12/17 - initiated education and provided handouts     LONG TERM GOALS: Target date: 08/07/23   Patient will demonstrate updated BUE HEP with visual cues only for proper execution. Baseline: New to outpt OT Goal status: In Progress   2.  Pt will independently recall at least 3 options for adaptive equipment to assist with joint protection, ergonomics, and body mechanic principles as noted in pt instructions for ADLs and IADLS.  Baseline:  New to outpt OT Goal status: MET 06/09/23 - Pt brought tools in previous session to assess and modify for max comfort.   3.  Pt will be independent with home based pain management routine to potentially include gloves/splints, heat and jt protection principles for minimal pain. Baseline: 4/10 at eval but not working Goal status: IN Progress 12/17 - pt has heat pad at home 06/09/23 - pt at 8/10 with work today, some comfort with ultrasound and heat   4.  Patient will demonstrate at improvement with quick Dash score reporting 30% disability or less indicating improved functional use of affected extremity.  Baseline: 38.6 Goal status: IN Progress 06/09/23 - 45.5 - pt is more impaired but has been working more than around time of eval  ASSESSMENT:  CLINICAL IMPRESSION: Patient is a 58 y.o. female who was seen today for occupational  therapy treatment for osteoarthritis affecting BUEs but RUE > LUE especially palmar surface of thumb with some relief with manual techniques today. Patient is self-reporting improved attention to joint protection and various home based pain management techniques are continuing to be explored.  Session continued to focus on jt protection, adaptive equipment and splinting options with use of personal tools for work.    PERFORMANCE DEFICITS: in functional skills including ADLs, IADLs, coordination, sensation, ROM, strength, pain, Fine motor control, Gross motor control, endurance, decreased knowledge of precautions, decreased knowledge of use of DME, and UE functional use, and psychosocial skills including coping strategies, environmental adaptation, and routines and behaviors.   IMPAIRMENTS: are limiting patient from ADLs, IADLs, rest and sleep, work, leisure, and social participation.   COMORBIDITIES: has co-morbidities such as OA, RA and COPD  that affects occupational performance. Patient will benefit from skilled OT to address above impairments and improve overall function.  REHAB POTENTIAL: Good  PLAN:  OT FREQUENCY: 1x/week  OT DURATION: additional 6 weeks - POC extended to cover scheduling with busy work season for patient  PLANNED INTERVENTIONS: 97535 self care/ADL training, 60454 therapeutic exercise, 97530 therapeutic activity, 97018 paraffin, 09811 fluidotherapy, 97760 Splinting (initial encounter), [phone] Subsequent splinting/medication, energy conservation, coping strategies training, patient/family education, and DME and/or AE instructions  RECOMMENDED OTHER SERVICES: May consult with Ortho - Dr. Fara Boros re: severe CMC OA if necessary  CONSULTED AND AGREED WITH PLAN OF CARE: Patient  PLAN FOR NEXT SESSION:  Check wrist splint trial - wrist v. thumb spica options to help at work Modality trials for home use - heat/ultrasound/paraffin and manual  techniques Further Joint protection  education AE education for ADLs and work tasks ie) large handles as comfort allows, bow maker   Victorino Sparrow, OT 06/30/2023, 4:01 PM

## 2023-07-14 ENCOUNTER — Ambulatory Visit: Payer: BC Managed Care – PPO | Attending: Family Medicine | Admitting: Occupational Therapy

## 2023-08-06 ENCOUNTER — Encounter: Payer: Self-pay | Admitting: Family Medicine

## 2023-08-12 ENCOUNTER — Encounter: Payer: Self-pay | Admitting: Internal Medicine

## 2023-08-12 ENCOUNTER — Ambulatory Visit: Payer: BC Managed Care – PPO | Admitting: Internal Medicine

## 2023-08-12 VITALS — BP 124/70 | HR 68 | Ht 60.0 in | Wt 104.6 lb

## 2023-08-12 DIAGNOSIS — Z87891 Personal history of nicotine dependence: Secondary | ICD-10-CM

## 2023-08-12 DIAGNOSIS — R918 Other nonspecific abnormal finding of lung field: Secondary | ICD-10-CM | POA: Diagnosis not present

## 2023-08-12 DIAGNOSIS — J439 Emphysema, unspecified: Secondary | ICD-10-CM

## 2023-08-12 DIAGNOSIS — J432 Centrilobular emphysema: Secondary | ICD-10-CM

## 2023-08-12 DIAGNOSIS — R0609 Other forms of dyspnea: Secondary | ICD-10-CM

## 2023-08-12 NOTE — Patient Instructions (Addendum)
 It was a pleasure to see you today!  Please schedule follow up scheduled with myself in 3 months.  If my schedule is not open yet, we will contact you with a reminder closer to that time. Please call [phone] if you haven't heard from [place] a month before, and always call us sooner if issues or concerns arise. You can also send [place] a message through MyChart, but but aware that this is not to be used for urgent issues and it may take up to 5-7 days to receive a reply. Please be aware that you will likely be able to view your results before I have a chance to respond to them. Please give [place] 5 business days to respond to any non-urgent results.    Before your next visit I would like you to have: Full set of PFTs -schedule this at the front desk  Get the results of the breathing testing and I will help me qualify for pulmonary rehab which is a structured exercise program for patients lung disease.   For now continue anoro inhaler Continue the albuterol inhaler as needed I recommend following up gynecology or primary care you may need pelvic floor physical therapy or maybe even urology.   Congratulations including smoking!  Understanding COPD   What is COPD? COPD stands for chronic obstructive pulmonary (lung) disease. COPD is a general term used for several lung diseases.  COPD is an umbrella term and encompasses other  common diseases in this group like chronic bronchitis and emphysema. Chronic asthma may also be included in this group. While some patients with COPD have only chronic bronchitis or emphysema, most patients have a combination of both.  You might hear these terms used in exchange for one another.   COPD adds to the work of the heart. Diseased lungs may reduce the amount of oxygen that goes to the blood. High blood pressure in blood vessels from the heart to the lungs makes it difficult for the heart to pump. Lung disease can also cause the body to produce too many red blood cells  which may make the blood thicker and harder to pump.   Patients who have COPD with low oxygen levels may develop an enlarged heart (cor pulmonale). This condition weakens the heart and causes increased shortness of breath and swelling in the legs and feet.   Chronic bronchitis Chronic bronchitis is irritation and inflammation (swelling) of the lining in the bronchial tubes (air passages). The irritation causes coughing and an excess amount of mucus in the airways. The swelling makes it difficult to get air in and out of the lungs. The small, hair-like structures on the inside of the airways (called cilia) may be damaged by the irritation. The cilia are then unable to help clean mucus from the airways.  Bronchitis is generally considered to be chronic when you have: a productive cough (cough up mucus) and shortness of breath that lasts about 3 months or more each year for 2 or more years in a row. Your doctor may define chronic bronchitis differently.   Emphysema Emphysema is the destruction, or breakdown, of the walls of the alveoli (air sacs) located at the end of the bronchial tubes. The damaged alveoli are not able to exchange oxygen and carbon dioxide between the lungs and the blood. The bronchioles lose their elasticity and collapse when you exhale, trapping air in the lungs. The trapped air keeps fresh air and oxygen from entering the lungs.   Who is  affected by COPD? Emphysema and chronic bronchitis affect approximately 16 million people in the [place], or close to 11 percent of the population.   Symptoms of COPD  Shortness of breath  Shortness of breath with mild exercise (walking, using the stairs, etc.)  Chronic, productive cough (with mucus)  A feeling of "tightness" in the chest  Wheezing   What causes COPD? The two primary causes of COPD are cigarette smoking and alpha1-antitrypsin (AAT) deficiency. Air pollution and occupational dusts may also contribute to COPD, especially  when the person exposed to these substances is a cigarette smoker.  Cigarette smoke causes COPD by irritating the airways and creating inflammation that narrows the airways, making it more difficult to breathe. Cigarette smoke also causes the cilia to stop working properly so mucus and trapped particles are not cleaned from the airways. As a result, chronic cough and excess mucus production develop, leading to chronic bronchitis.  In some people, chronic bronchitis and infections can lead to destruction of the small airways, or emphysema.  AAT deficiency, an inherited disorder, can also lead to emphysema. Alpha antitrypsin (AAT) is a protective material produced in the liver and transported to the lungs to help combat inflammation. When there is not enough of the chemical AAT, the body is no longer protected from an enzyme in the white blood cells.   How is COPD diagnosed?  To diagnose COPD, the physician needs to know: Do you smoke?  Have you had chronic exposure to dust or air pollutants?  Do other members of your family have lung disease?  Are you short of breath?  Do you get short of breath with exercise?  Do you have chronic cough and/or wheezing?  Do you cough up excess mucus?  To help with the diagnosis, the physician will conduct a thorough physical exam which includes:  Listening to your lungs and heart  Checking your blood pressure and pulse  Examining your nose and throat  Checking your feet and ankles for swelling   Laboratory and other tests Several laboratory and other tests are needed to confirm a diagnosis of COPD. These tests may include:  Chest X-ray to look for lung changes that could be caused by COPD   Spirometry and pulmonary function tests (PFTs) to determine lung volume and air flow  Pulse oximetry to measure the saturation of oxygen in the blood  Arterial blood gases (ABGs) to determine the amount of oxygen and carbon dioxide in the blood  Exercise testing to  determine if the oxygen level in the blood drops during exercise   Treatment In the beginning stages of COPD, there is minimal shortness of breath that may be noticed only during exercise. As the disease progresses, shortness of breath may worsen and you may need to wear an oxygen device.   To help control other symptoms of COPD, the following treatments and lifestyle changes may be prescribed.  Quitting smoking  Avoiding cigarette smoke and other irritants  Taking medications including: a. bronchodilators b. anti-inflammatory agents c. oxygen d. antibiotics  Maintaining a healthy diet  Following a structured exercise program such as pulmonary rehabilitation Preventing respiratory infections  Controlling stress   If your COPD progresses, you may be eligible to be evaluated for lung volume reduction surgery or lung transplantation. You may also be eligible to participate in certain clinical trials (research studies). Ask your health care providers about studies being conducted in your hospital.   What is the outlook? Although COPD can not be  cured, its symptoms can be treated and your quality of life can be improved. Your prognosis or outlook for the future will depend on how well your lungs are functioning, your symptoms, and how well you respond to and follow your treatment plan.

## 2023-08-12 NOTE — Progress Notes (Signed)
 Cynthia Duran    [id number]    09-21-1965  Primary Care Physician:Hunter, Aldine Contes, MD  Referring Physician: Shelva Majestic, MD [address] [place],  [place] [address] Reason for Consultation: shortness of breath Date of Consultation: 08/12/2023  Chief complaint:   Chief Complaint  Patient presents with   Consult    Establish care      HPI: Cynthia Duran is a 58 y.o. woman who presents for new patient evaluation for COPD. Having dyspnea with exertion over the last couple of years and had an episode requiring ED visit and breathing treatments last year. Since she stopped smoking in October 2024 her breathing has improved. Energy has improved, coughing less, dyspnea improved. Was started on anoro last year and does feel like it's helping. Doesn't take it everyday.   Breathing worse with changes in weather.  Overall less active than she used to be. She is scared to be more active due to dyspnea.   She sometimes has a shooting pain in her groin accompanied by urinary incontinence.   Has been followed in lung cancer screening clinic.   Social history:  Occupation: owns a Librarian, academic, Publishing rights manager business. Smoking history: 63 pack years quit smoking October 2024. Still vaping - [employer]. Quit smoking with hypnosis   Social History   Occupational History   Occupation: Unemployed   Tobacco Use   Smoking status: Former    Current packs/day: 1.50    Average packs/day: 1.5 packs/day for 42.0 years (63.0 ttl pk-yrs)    Types: Cigarettes   Smokeless tobacco: Never   Tobacco comments:    Quit October of last year  Substance and Sexual Activity   Alcohol use: Yes    Comment: socially    Drug use: No   Sexual activity: Not Currently    Birth control/protection: Post-menopausal    Relevant family history:  Family History  Problem Relation Age of Onset   COPD Mother    Rheum arthritis Mother        biological mom   Heart disease Father     Hypertension Father    Diabetes Maternal Grandmother    Thyroid disease Maternal Grandmother    Hypertension Brother    Psoriasis Brother    Colon cancer Neg Hx     Past Medical History:  Diagnosis Date   Arthritis Dx 2006   Frequent UTI    Knee joint disorder    reports gel injections in past   Pneumonia    Yeast infection     Past Surgical History:  Procedure Laterality Date   cyst removed Right 2009    elbow, Dr. Kelli Hope    LAPAROSCOPIC ABDOMINAL EXPLORATION       Physical Exam: Blood pressure 124/70, pulse 68, height 5' (1.524 m), weight 104 lb 9.6 oz (47.4 kg), last menstrual period 09/27/2007, SpO2 95%. Gen:      No acute distress, thin ENT:  no nasal polyps, mucus membranes moist Lungs:    diminished, No increased respiratory effort, symmetric chest wall excursion, clear to auscultation bilaterally, no wheezes or crackles CV:         Regular rate and rhythm; no murmurs, rubs, or gallops.  No pedal edema Abd:      + bowel sounds; soft, non-tender; no distension MSK: no acute synovitis of DIP or PIP joints, no mechanics hands.  Skin:      Warm and dry; no rashes Neuro: normal  speech, no focal facial asymmetry Psych: alert and oriented x3, normal mood and affect   Data Reviewed/Medical Decision Making:  Independent interpretation of tests: Imaging:  Review of patient's ct chest feb 2025 images revealed centrilobular emphysema and bronchial wall thickening. Sub 6mm nodule. The patient's images have been independently reviewed by me.    PFTs: I have personally reviewed the patient's PFTs and moderately severe COPD in May 2017    Latest Ref Rng & Units 09/17/2015    1:38 PM  PFT Results  FVC-Pre L 2.60   FVC-Predicted Pre % 83   FVC-Post L 2.65   FVC-Predicted Post % 85   Pre FEV1/FVC % % 54   Post FEV1/FCV % % 54   FEV1-Pre L 1.41   FEV1-Predicted Pre % 56   FEV1-Post L 1.43   DLCO uncorrected ml/min/mmHg 10.70   DLCO UNC% % 56   DLCO corrected  ml/min/mmHg 10.45   DLCO COR %Predicted % 55   DLVA Predicted % 52   TLC L 5.01   TLC % Predicted % 112   RV % Predicted % 148     Labs:  Lab Results  Component Value Date   NA 140 03/24/2023   K 3.8 03/24/2023   CO2 30 03/24/2023   GLUCOSE 74 03/24/2023   BUN 7 03/24/2023   CREATININE 0.72 03/24/2023   CALCIUM 9.0 03/24/2023   GFR 94.37 03/24/2023   GFRNONAA >60 02/08/2023   Lab Results  Component Value Date   WBC 7.1 03/24/2023   HGB 14.3 03/24/2023   HCT 42.9 03/24/2023   MCV 94.2 03/24/2023   PLT 198.0 03/24/2023     Immunization status:   There is no immunization history on file for this patient.   I reviewed prior external note(s) from pulmonary, pcp  I reviewed the result(s) of the labs and imaging as noted above.   I have ordered lung cancer screening   Assessment:  COPD with centrilobular emphysema FEV1 54% of predicted in 2017 with suspected progression Multiple pulmonary nodules History of tobacco use disorder, recent smoking cessation  Plan/Recommendations: Obtain Full set of PFTs -schedule this at the front desk  Get the results of the breathing testing and I will help me qualify for pulmonary rehab which is a structured exercise program for patients lung disease.  For now continue anoro inhaler Continue the albuterol inhaler as needed  I recommend following up gynecology or primary care you may need pelvic floor physical therapy or maybe even urology.   Congratulations including smoking!  We discussed disease management and progression at length today.   Return to Care: Return in about 3 months (around 11/11/2023).  Durel Salts, MD Pulmonary and Critical Care Medicine [facility] Office:[phone]  CC: Shelva Majestic, MD

## 2023-11-11 ENCOUNTER — Ambulatory Visit: Admitting: Internal Medicine

## 2023-11-11 ENCOUNTER — Encounter: Payer: Self-pay | Admitting: Internal Medicine

## 2023-11-11 ENCOUNTER — Encounter

## 2023-11-11 VITALS — BP 130/80 | HR 79 | Temp 97.7°F | Ht 60.0 in | Wt 98.0 lb

## 2023-11-11 DIAGNOSIS — Z87891 Personal history of nicotine dependence: Secondary | ICD-10-CM

## 2023-11-11 DIAGNOSIS — R0609 Other forms of dyspnea: Secondary | ICD-10-CM

## 2023-11-11 DIAGNOSIS — J449 Chronic obstructive pulmonary disease, unspecified: Secondary | ICD-10-CM

## 2023-11-11 DIAGNOSIS — J4489 Other specified chronic obstructive pulmonary disease: Secondary | ICD-10-CM

## 2023-11-11 LAB — PULMONARY FUNCTION TEST
DL/VA % pred: 45 %
DL/VA: 1.99 ml/min/mmHg/L
DLCO unc % pred: 44 %
DLCO unc: 8.04 ml/min/mmHg
FEF 25-75 Post: 0.46 L/s
FEF 25-75 Pre: 0.3 L/s
FEF2575-%Change-Post: 56 %
FEF2575-%Pred-Post: 19 %
FEF2575-%Pred-Pre: 12 %
FEV1-%Change-Post: 30 %
FEV1-%Pred-Post: 39 %
FEV1-%Pred-Pre: 30 %
FEV1-Post: 0.92 L
FEV1-Pre: 0.71 L
FEV1FVC-%Change-Post: 5 %
FEV1FVC-%Pred-Pre: 49 %
FEV6-%Change-Post: 21 %
FEV6-%Pred-Post: 71 %
FEV6-%Pred-Pre: 59 %
FEV6-Post: 2.06 L
FEV6-Pre: 1.69 L
FEV6FVC-%Change-Post: -1 %
FEV6FVC-%Pred-Post: 94 %
FEV6FVC-%Pred-Pre: 96 %
FVC-%Change-Post: 23 %
FVC-%Pred-Post: 75 %
FVC-%Pred-Pre: 61 %
FVC-Post: 2.25 L
FVC-Pre: 1.82 L
Post FEV1/FVC ratio: 41 %
Post FEV6/FVC ratio: 92 %
Pre FEV1/FVC ratio: 39 %
Pre FEV6/FVC Ratio: 93 %
RV % pred: 198 %
RV: 3.35 L
TLC % pred: 128 %
TLC: 5.74 L

## 2023-11-11 MED ORDER — BREZTRI AEROSPHERE 160-9-4.8 MCG/ACT IN AERO: 2.0000 | INHALATION_SPRAY | Freq: Two times a day (BID) | RESPIRATORY_TRACT | 5 refills | Status: DC

## 2023-11-11 MED ORDER — ALBUTEROL SULFATE (2.5 MG/3ML) 0.083% IN NEBU: 2.5000 mg | INHALATION_SOLUTION | Freq: Four times a day (QID) | RESPIRATORY_TRACT | 12 refills | Status: AC | PRN

## 2023-11-11 NOTE — Progress Notes (Signed)
 Cynthia Duran    [id number]    Mar 27, 1966  Primary Care Physician:Hunter, Garnette KIDD, MD Date of Appointment: 11/11/2023 Established Patient Visit  Chief complaint:   Chief Complaint  Patient presents with   Follow-up    Doing well.   Review PFT from today     HPI: Cynthia Duran is a 58 y.o. woman with tobacco use disorder quit 2024.   Interval Updates: Here for follow up with PFTS which show very sereve copd.   Feels breathing is getting worse. Still on anoro 1 puff once daily. Albuterol  prn use is minimal but only because she forgets to take it. It does help when she takes it.   She went to the beach and avoids walking due to dyspnea. Feels tired all the time.                                                                                   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 Wants to be around for children and grandchildren.   I have reviewed the patient's family social and past medical history and updated as appropriate.   Past Medical History:  Diagnosis Date   Arthritis Dx 2006   Frequent UTI    Knee joint disorder    reports gel injections in past   Pneumonia    Yeast infection     Past Surgical History:  Procedure Laterality Date   cyst removed Right 2009    elbow, Dr. Velva    LAPAROSCOPIC ABDOMINAL EXPLORATION      Family History  Problem Relation Age of Onset   COPD Mother    Rheum arthritis Mother        biological mom   Heart disease Father    Hypertension Father    Diabetes Maternal Grandmother    Thyroid  disease Maternal Grandmother    Hypertension Brother    Psoriasis Brother    Colon cancer Neg Hx     Social History   Occupational History   Occupation: Unemployed   Tobacco Use   Smoking status: Former    Current packs/day: 1.50    Average packs/day: 1.5 packs/day for 42.0 years (63.0 ttl pk-yrs)    Types: Cigarettes   Smokeless tobacco: Never   Tobacco comments:    Quit  October of last year  Substance and Sexual Activity   Alcohol use: Yes    Comment: socially    Drug use: No   Sexual activity: Not Currently    Birth control/protection: Post-menopausal     Physical Exam: Blood pressure 130/80, pulse 79, temperature 97.7 F (36.5 C), temperature source Oral, height 5' (1.524 m), weight 98 lb (44.5 kg), last menstrual period 09/27/2007, SpO2 96%.  Gen:      No acute distress, fatigued, appears older than stated age ENT:  no nasal polyps, mucus membranes moist Lungs:  diminished, no wheezes or crackles CV:         Regular rate and rhythm; no murmurs, rubs, or gallops.  No pedal edema   Data Reviewed: Imaging: I have personally reviewed the CT Chest feb 2025 -  no suspicious nodules or masses, emphysema and bronchial wall thickening  PFTs:     Latest Ref Rng & Units 11/11/2023    9:54 AM 09/17/2015    1:38 PM  PFT Results  FVC-Pre L 1.82  P 2.60   FVC-Predicted Pre % 61  P 83   FVC-Post L 2.25  P 2.65   FVC-Predicted Post % 75  P 85   Pre FEV1/FVC % % 39  P 54   Post FEV1/FCV % % 41  P 54   FEV1-Pre L 0.71  P 1.41   FEV1-Predicted Pre % 30  P 56   FEV1-Post L 0.92  P 1.43   DLCO uncorrected ml/min/mmHg 8.04  P 10.70   DLCO UNC% % 44  P 56   DLCO corrected ml/min/mmHg  10.45   DLCO COR %Predicted %  55   DLVA Predicted % 45  P 52   TLC L 5.74  P 5.01   TLC % Predicted % 128  P 112   RV % Predicted % 198  P 148     P Preliminary result   I have personally reviewed the patient's PFTs and very severe airflow limitation with hyperinflation and air trapping  Labs: Lab Results  Component Value Date   NA 140 03/24/2023   K 3.8 03/24/2023   CO2 30 03/24/2023   GLUCOSE 74 03/24/2023   BUN 7 03/24/2023   CREATININE 0.72 03/24/2023   CALCIUM  9.0 03/24/2023   GFR 94.37 03/24/2023   GFRNONAA >60 02/08/2023   Lab Results  Component Value Date   WBC 7.1 03/24/2023   HGB 14.3 03/24/2023   HCT 42.9 03/24/2023   MCV 94.2 03/24/2023   PLT  198.0 03/24/2023    Immunization status:  There is no immunization history on file for this patient.  External Records Personally Reviewed: pulmonary, pcp  Assessment:  COPD with centrilobular emphysema FEV1 30% of predicted Multiple pulmonary nodules History of tobacco use disorder, recent smoking cessation Need for lung cancer screening.    Plan/Recommendations: Sorry to hear your breathing is not doing well.  Your COPD seems to be worsening since 2017 based on the breathing testing we did today.  Stop the Anoro inhaler and we will switch you to Breztri inhaler 2 puffs twice daily, gargle after use.  Continue the albuterol  inhaler as needed.  I am also sending a nebulizer machine to your home through a DME company and will send some breathing treatments to your pharmacy for you to take as needed.  I am referring you to pulmonary rehab which is a structured exercise program for patients with chronic lung disease.  I think you will really enjoy this and it will help your daily symptoms of breathlessness.  For your pelvic floor and urinary symptoms I recommend seeing a urogynecologist.  Next due for scan in Feb 2026.   Return to Care: Return in about 3 months (around 02/11/2024).   [name], MD Pulmonary and Critical Care Medicine [facility] Office:[phone]

## 2023-11-11 NOTE — Patient Instructions (Signed)
 Full pft performed today.

## 2023-11-11 NOTE — Progress Notes (Signed)
 Full pft performed today.

## 2023-11-11 NOTE — Patient Instructions (Addendum)
 It was a pleasure to see you today!  Please schedule follow up with myself in 3 months.  If my schedule is not open yet, we will contact you with a reminder closer to that time. Please call [phone] if you haven't heard from us  a month before, and always call us  sooner if issues or concerns arise. You can also send us  a message through MyChart, but but aware that this is not to be used for urgent issues and it may take up to 5-7 days to receive a reply. Please be aware that you will likely be able to view your results before I have a chance to respond to them. Please give us  5 business days to respond to any non-urgent results.    Sorry to hear your breathing is not doing well.  Your COPD seems to be worsening since 2017 based on the breathing testing we did today.  Stop the Anoro inhaler and we will switch you to Breztri inhaler 2 puffs twice daily, gargle after use.  Continue the albuterol  inhaler as needed.  I am also sending a nebulizer machine to your home through a DME company and will send some breathing treatments to your pharmacy for you to take as needed.  I am referring you to pulmonary rehab which is a structured exercise program for patients with chronic lung disease.  I think you will really enjoy this and it will help your daily symptoms of breathlessness.  For your pelvic floor and urinary symptoms I recommend seeing a urogynecologist.

## 2023-11-16 ENCOUNTER — Telehealth (HOSPITAL_COMMUNITY): Payer: Self-pay

## 2023-11-16 NOTE — Telephone Encounter (Signed)
 Referred to Pulmonary rehab by Dr. Meade with the diagnosis of COPD stage 3. Clinical review of pt follow up appt on 7/2 with Dr. Meade - pulmonary office note. Pt appropriate for scheduling for on site Pulmonary Rehab. Will forward to Estate manager/land agent  for scheduling and insurance verification.  Cynthia Arenson, MS, ACSM-CEP

## 2023-11-24 ENCOUNTER — Telehealth (HOSPITAL_COMMUNITY): Payer: Self-pay

## 2023-11-24 NOTE — Telephone Encounter (Signed)
 Pt insurance is active and benefits verified through [employer]. Co-pay $0, DED $4,000/$0 met, out of pocket $9,200/$289.60 met, co-insurance 30%. No pre-authorization required. 11/24/2023 @ 9:18am, spoke with Austin HERO., REF# [id number].

## 2023-11-25 ENCOUNTER — Telehealth (HOSPITAL_COMMUNITY): Payer: Self-pay

## 2023-11-25 NOTE — Telephone Encounter (Signed)
 Called pt to see if she was interested in participating in the pulmonary rehab program. No answer. Left VM.

## 2023-11-27 ENCOUNTER — Encounter (HOSPITAL_COMMUNITY): Payer: Self-pay

## 2023-12-10 ENCOUNTER — Telehealth (HOSPITAL_COMMUNITY): Payer: Self-pay

## 2023-12-10 NOTE — Telephone Encounter (Signed)
 Patient requested reverification of insurance. Reverified insurance through [place], benefits are the same. Spoke with Slater, mzq#[id number].  Attempted to call patient to go over this information- no answer, left message to call us  back.

## 2023-12-18 ENCOUNTER — Telehealth (HOSPITAL_COMMUNITY): Payer: Self-pay

## 2023-12-18 NOTE — Telephone Encounter (Signed)
 Called patient to schedule pulmonary rehab, patient states that due to the cost she is unable to participate at this time. Informed patient if anything changes she can call us  to reopen the referral.  Closing referral.

## 2024-02-12 ENCOUNTER — Telehealth: Payer: Self-pay | Admitting: Family Medicine

## 2024-02-12 DIAGNOSIS — Z1321 Encounter for screening for nutritional disorder: Secondary | ICD-10-CM

## 2024-02-12 DIAGNOSIS — I700 Atherosclerosis of aorta: Secondary | ICD-10-CM

## 2024-02-12 DIAGNOSIS — Z0000 Encounter for general adult medical examination without abnormal findings: Secondary | ICD-10-CM

## 2024-02-12 DIAGNOSIS — R5383 Other fatigue: Secondary | ICD-10-CM

## 2024-02-12 DIAGNOSIS — E785 Hyperlipidemia, unspecified: Secondary | ICD-10-CM

## 2024-02-12 DIAGNOSIS — F17200 Nicotine dependence, unspecified, uncomplicated: Secondary | ICD-10-CM

## 2024-02-12 DIAGNOSIS — M810 Age-related osteoporosis without current pathological fracture: Secondary | ICD-10-CM

## 2024-02-12 DIAGNOSIS — R252 Cramp and spasm: Secondary | ICD-10-CM

## 2024-02-12 DIAGNOSIS — Z87891 Personal history of nicotine dependence: Secondary | ICD-10-CM

## 2024-02-12 NOTE — Addendum Note (Signed)
 Addended by: KATRINKA GARNETTE KIDD on: 02/12/2024 09:04 AM   Modules accepted: Orders

## 2024-02-12 NOTE — Telephone Encounter (Signed)
 Spoke with patient. She is aware insurance may not cover. She still wants to proceed with labs and vitamin levels. Scheduled for 02/17/2024 at 0930.

## 2024-02-12 NOTE — Telephone Encounter (Signed)
 I have ordered labs but I will be honest that frequently vitamin B12 and D are not covered unless there is a known history of deficiency-there is a chance her insurance will not cover these labs but I did order them per her preference

## 2024-02-12 NOTE — Telephone Encounter (Signed)
 Patient would like to get labs prior to physical. Asking specifically for vitamin levels to be checked. Which other orders would you like me to place?    Copied from CRM #[id number]. Topic: Clinical - Request for Lab/Test Order >> Feb 11, 2024  9:48 AM Terri MATSU wrote: Reason for CRM: Patient wants to get some blood work asap. Callback number [phone]

## 2024-02-17 ENCOUNTER — Other Ambulatory Visit

## 2024-02-25 ENCOUNTER — Encounter: Payer: Self-pay | Admitting: Family Medicine

## 2024-02-25 ENCOUNTER — Ambulatory Visit: Admitting: Family Medicine

## 2024-02-25 DIAGNOSIS — I700 Atherosclerosis of aorta: Secondary | ICD-10-CM | POA: Diagnosis not present

## 2024-02-25 DIAGNOSIS — Z1321 Encounter for screening for nutritional disorder: Secondary | ICD-10-CM | POA: Diagnosis not present

## 2024-02-25 DIAGNOSIS — R252 Cramp and spasm: Secondary | ICD-10-CM | POA: Diagnosis not present

## 2024-02-25 DIAGNOSIS — Z87891 Personal history of nicotine dependence: Secondary | ICD-10-CM | POA: Diagnosis not present

## 2024-02-25 DIAGNOSIS — M810 Age-related osteoporosis without current pathological fracture: Secondary | ICD-10-CM

## 2024-02-25 DIAGNOSIS — E785 Hyperlipidemia, unspecified: Secondary | ICD-10-CM

## 2024-02-25 NOTE — Addendum Note (Signed)
 Addended by: KATRINKA GARNETTE KIDD on: 02/25/2024 03:15 PM   Modules accepted: Orders

## 2024-02-25 NOTE — Progress Notes (Signed)
 Phone [phone] In person visit   Subjective:   JOSEPH JOHNS is a 58 y.o. year old very pleasant female patient who presents for/with See problem oriented charting Chief Complaint  Patient presents with   Medical Management of Chronic Issues    Too early for physical; brain fog; fatigue; vision has changed but she saw her eye doc recently and got new glasses; left lower leg is sore to the touch; will be making an obgyn app to get pap and mammo in the next month or so; declining all vaccines today;    Past Medical History-  Patient Active Problem List   Diagnosis Date Noted   COPD (chronic obstructive pulmonary disease) (HCC) 09/13/2015    Priority: High   Smoker 03/27/2014    Priority: High   Aortic atherosclerosis 06/13/2022    Priority: Medium    Abdominal bloating 03/20/2017    Priority: Medium    Family history of early CAD 04/02/2016    Priority: Medium    Hypersomnia 09/14/2015    Priority: Low   Chronic fatigue 05/24/2015    Priority: Low   Osteoarthritis 09/19/2014    Priority: Low   Low back pain 03/27/2014    Priority: Low   Early menopause 03/27/2014    Priority: Low    Medications- reviewed and updated Current Outpatient Medications  Medication Sig Dispense Refill   albuterol  (PROVENTIL ) (2.5 MG/3ML) 0.083% nebulizer solution Take 3 mLs (2.5 mg total) by nebulization every 6 (six) hours as needed for wheezing or shortness of breath. 75 mL 12   albuterol  (VENTOLIN  HFA) 108 (90 Base) MCG/ACT inhaler Inhale 2 puffs into the lungs every 6 (six) hours as needed for wheezing or shortness of breath. 1 each 5   Bacillus Coagulans-Inulin (ALIGN PREBIOTIC-PROBIOTIC PO) Take by mouth.     Biotin 89999 MCG TABS Take by mouth.     budesonide -glycopyrrolate-formoterol  (BREZTRI  AEROSPHERE) 160-9-4.8 MCG/ACT AERO inhaler Inhale 2 puffs into the lungs in the morning and at bedtime. 10.7 g 5   No current facility-administered medications for this visit.      Objective:  BP 120/70 (BP Location: Left Arm, Patient Position: Sitting, Cuff Size: Normal)   Pulse 86   Temp 98.1 F (36.7 C) (Temporal)   Ht 5' (1.524 m)   Wt 94 lb 3.2 oz (42.7 kg)   LMP 09/27/2007 (Exact Date)   SpO2 96%   BMI 18.40 kg/m  Gen: When discussing her life situation appears overwhelmed CV: RRR no murmurs rubs or gallops Lungs: CTAB no crackles, wheeze, rhonchi Abdomen: soft/nontender/nondistended/normal bowel sounds. No rebound or guarding.  Ext: no edema, mildly tender to touch over just medial to the left shin-no abnormalities in calf or tenderness Skin: warm, dry     Assessment and Plan   # Brain fog/fatigue in patient with COPD S: Several years of fatigue Patient reports issues with brain fog starting in last 6 months. Found out she has stage IV COPD- showers In am takes a lot out of her- doing Breztri  just once a day instead of twice a day- worse if misses it. A year next Thursday with no cigarettes. Still gets some cough and feels really tight in abdomen- locks up basically. Has to massage it out. No significant memory loss just houghts racing - She had noted some visual issues but saw her eye doctor recently and got new glasses which has been helpful - no cycle since 2009 when had IUD placed even when removed. She is postmenopausal. No  hot flashes.  She has read a lot online and feels her symptoms could be based on menopause - a lot of stressors. Likely undealth with grief for multiple losses in last few years. Very business with business     02/25/2024    2:48 PM 05/12/2023    9:51 AM 04/10/2022   10:05 AM  Depression screen PHQ 2/9  Decreased Interest 0 0 0  Down, Depressed, Hopeless 3 0 0  PHQ - 2 Score 3 0 0  Altered sleeping 2    Tired, decreased energy 3    Change in appetite 1    Feeling bad or failure about yourself  0    Trouble concentrating 1    Moving slowly or fidgety/restless 0    Suicidal thoughts 0    PHQ-9 Score 10    Difficult  doing work/chores Somewhat difficult     A/P: Patient with several years of fatigue.  She does have underlying COPD and this could certainly be related.  Though her fatigue seems to be around a persistent issue regardless of whether she is short of breath or wheezing-check labs as below - She also had brain fog her last 6 months-check B12 and TSH -She wonders about menopause being a factor in her symptoms we discussed this is certainly possible though the main indication I have seen for heart replacement is intolerable hot flashes but she does not have.  She plans to discuss further with her GYN.  I also think her cardiac risk would be increased compared to average with long-term smoking history though I am thrilled she has quit in the last year -Elevated PHQ-9 depression could be at play here but she feels like this is primarily situational and is not interested in therapy or medication at this time-I strongly encouraged her to Strukel some things off her plate if possible  # Former smoker-get urinalysis  # Left lower leg soreness to touch-could be muscular tenderness or spasm but reports some cramping in the area that time-also feels like when she coughs if cramping that is severe in her upper abdomen-check electrolytes and magnesium   # Health maintenance-plans on scheduling with OB/GYN for Pap smear and mammogram in the next month    Recommended follow up: Return in about 6 months (around 08/25/2024) for physical or sooner if needed.Schedule b4 you leave.  Lab/Order associations: light intake today   ICD-10-CM   1. Leg cramping  R25.2 Magnesium     2. Encounter for vitamin deficiency screening  Z13.21 Vitamin B12    3. Age-related osteoporosis without current pathological fracture  M81.0 VITAMIN D  25 Hydroxy (Vit-D Deficiency, Fractures)    4. Former smoker  Z87.891 Urinalysis, Routine w reflex microscopic    5. Aortic atherosclerosis  I70.0 TSH    Lipid panel    CBC with  Differential/Platelet    Comprehensive metabolic panel with GFR    6. Hyperlipidemia, unspecified hyperlipidemia type  E78.5 TSH    Lipid panel    CBC with Differential/Platelet    Comprehensive metabolic panel with GFR      I personally spent a total of 35 minutes in the care of the patient today including preparing to see the patient, getting/reviewing separately obtained history, performing a medically appropriate exam/evaluation, counseling and educating, placing orders, and documenting clinical information in the EHR.   Return precautions advised.  Garnette Lukes, MD

## 2024-02-25 NOTE — Patient Instructions (Addendum)
 Please stop by lab before you go If you have mychart- we will send your results within 3 business days of us  receiving them.  If you do not have mychart- we will call you about results within 5 business days of us  receiving them.  *please also note that you will see labs on mychart as soon as they post. I will later go in and write notes on them- will say notes from Dr. Katrinka   Magnesium  glycinate before bed  Agree with seeing gynecology back  Recommended follow up: Return in about 6 months (around 08/25/2024) for physical or sooner if needed.Schedule b4 you leave.

## 2024-02-26 LAB — CBC WITH DIFFERENTIAL/PLATELET
Basophils Absolute: 0.1 K/uL (ref 0.0–0.1)
Basophils Relative: 1.3 % (ref 0.0–3.0)
Eosinophils Absolute: 0.1 K/uL (ref 0.0–0.7)
Eosinophils Relative: 1.1 % (ref 0.0–5.0)
HCT: 43.8 % (ref 36.0–46.0)
Hemoglobin: 14.7 g/dL (ref 12.0–15.0)
Lymphocytes Relative: 37.6 % (ref 12.0–46.0)
Lymphs Abs: 2.5 K/uL (ref 0.7–4.0)
MCHC: 33.6 g/dL (ref 30.0–36.0)
MCV: 91.8 fl (ref 78.0–100.0)
Monocytes Absolute: 0.6 K/uL (ref 0.1–1.0)
Monocytes Relative: 9.9 % (ref 3.0–12.0)
Neutro Abs: 3.3 K/uL (ref 1.4–7.7)
Neutrophils Relative %: 50.1 % (ref 43.0–77.0)
Platelets: 220 K/uL (ref 150.0–400.0)
RBC: 4.77 Mil/uL (ref 3.87–5.11)
RDW: 13.8 % (ref 11.5–15.5)
WBC: 6.5 K/uL (ref 4.0–10.5)

## 2024-02-26 LAB — URINALYSIS, ROUTINE W REFLEX MICROSCOPIC
Bilirubin Urine: NEGATIVE
Hgb urine dipstick: NEGATIVE
Ketones, ur: NEGATIVE
Leukocytes,Ua: NEGATIVE
Nitrite: NEGATIVE
RBC / HPF: NONE SEEN (ref 0–?)
Specific Gravity, Urine: <=1.005 — AB (ref 1.000–1.030)
Total Protein, Urine: NEGATIVE
Urine Glucose: NEGATIVE
Urobilinogen, UA: 0.2 (ref 0.0–1.0)
WBC, UA: NONE SEEN (ref 0–?)
pH: 7 (ref 5.0–8.0)

## 2024-02-26 LAB — LIPID PANEL
Cholesterol: 181 mg/dL (ref 0–200)
HDL: 65 mg/dL (ref >39.00)
LDL Cholesterol: 99 mg/dL (ref 0–99)
NonHDL: 115.99
Total CHOL/HDL Ratio: 3
Triglycerides: 83 mg/dL (ref 0.0–149.0)
VLDL: 16.6 mg/dL (ref 0.0–40.0)

## 2024-02-26 LAB — COMPREHENSIVE METABOLIC PANEL WITH GFR
ALT: 12 U/L (ref 0–35)
AST: 17 U/L (ref 0–37)
Albumin: 4.9 g/dL (ref 3.5–5.2)
Alkaline Phosphatase: 58 U/L (ref 39–117)
BUN: 8 mg/dL (ref 6–23)
CO2: 28 meq/L (ref 19–32)
Calcium: 9.3 mg/dL (ref 8.4–10.5)
Chloride: 103 meq/L (ref 96–112)
Creatinine, Ser: 0.71 mg/dL (ref 0.40–1.20)
GFR: 95.34 mL/min (ref >60.00)
Glucose, Bld: 74 mg/dL (ref 70–99)
Potassium: 3.7 meq/L (ref 3.5–5.1)
Sodium: 141 meq/L (ref 135–145)
Total Bilirubin: 0.4 mg/dL (ref 0.2–1.2)
Total Protein: 7.1 g/dL (ref 6.0–8.3)

## 2024-02-26 LAB — VITAMIN D 25 HYDROXY (VIT D DEFICIENCY, FRACTURES): VITD: 18.41 ng/mL — ABNORMAL LOW (ref 30.00–100.00)

## 2024-02-26 LAB — VITAMIN B12: Vitamin B-12: 455 pg/mL (ref 211–911)

## 2024-02-26 LAB — MAGNESIUM: Magnesium: 1.8 mg/dL (ref 1.5–2.5)

## 2024-02-26 LAB — TSH: TSH: 0.86 u[IU]/mL (ref 0.35–5.50)

## 2024-02-27 ENCOUNTER — Ambulatory Visit: Payer: Self-pay | Admitting: Family Medicine

## 2024-02-27 MED ORDER — VITAMIN D (ERGOCALCIFEROL) 1.25 MG (50000 UNIT) PO CAPS: 50000.0000 [IU] | ORAL_CAPSULE | ORAL | 1 refills | Status: AC

## 2024-03-29 ENCOUNTER — Encounter: Payer: Self-pay | Admitting: Family Medicine

## 2024-03-29 DIAGNOSIS — E673 Hypervitaminosis D: Secondary | ICD-10-CM

## 2024-03-29 DIAGNOSIS — Z79899 Other long term (current) drug therapy: Secondary | ICD-10-CM

## 2024-03-30 ENCOUNTER — Other Ambulatory Visit (INDEPENDENT_AMBULATORY_CARE_PROVIDER_SITE_OTHER)

## 2024-03-30 DIAGNOSIS — E673 Hypervitaminosis D: Secondary | ICD-10-CM

## 2024-03-30 DIAGNOSIS — Z130 Encounter for screening for diseases of the blood and blood-forming organs and certain disorders involving the immune mechanism: Secondary | ICD-10-CM | POA: Diagnosis not present

## 2024-03-30 DIAGNOSIS — N870 Mild cervical dysplasia: Secondary | ICD-10-CM | POA: Diagnosis not present

## 2024-03-30 DIAGNOSIS — Z79899 Other long term (current) drug therapy: Secondary | ICD-10-CM | POA: Diagnosis not present

## 2024-03-30 DIAGNOSIS — Z1231 Encounter for screening mammogram for malignant neoplasm of breast: Secondary | ICD-10-CM | POA: Diagnosis not present

## 2024-03-30 DIAGNOSIS — Z01419 Encounter for gynecological examination (general) (routine) without abnormal findings: Secondary | ICD-10-CM | POA: Diagnosis not present

## 2024-03-30 LAB — COMPREHENSIVE METABOLIC PANEL WITH GFR
ALT: 15 U/L (ref 0–35)
AST: 21 U/L (ref 0–37)
Albumin: 4.4 g/dL (ref 3.5–5.2)
Alkaline Phosphatase: 58 U/L (ref 39–117)
BUN: 4 mg/dL — ABNORMAL LOW (ref 6–23)
CO2: 27 meq/L (ref 19–32)
Calcium: 9.1 mg/dL (ref 8.4–10.5)
Chloride: 105 meq/L (ref 96–112)
Creatinine, Ser: 0.67 mg/dL (ref 0.40–1.20)
GFR: 97.95 mL/min (ref >60.00)
Glucose, Bld: 96 mg/dL (ref 70–99)
Potassium: 3.7 meq/L (ref 3.5–5.1)
Sodium: 141 meq/L (ref 135–145)
Total Bilirubin: 0.4 mg/dL (ref 0.2–1.2)
Total Protein: 6.9 g/dL (ref 6.0–8.3)

## 2024-03-30 LAB — VITAMIN D 25 HYDROXY (VIT D DEFICIENCY, FRACTURES): VITD: 97.72 ng/mL (ref 30.00–100.00)

## 2024-03-30 LAB — PHOSPHORUS: Phosphorus: 3.7 mg/dL (ref 2.3–4.6)

## 2024-03-30 NOTE — Telephone Encounter (Signed)
 Pt coming in for labs today after another appt. Aware to seek emergency care if confusion develops of excess thirst and muscle weakness. States she is okay right now.

## 2024-03-31 ENCOUNTER — Ambulatory Visit: Payer: Self-pay | Admitting: Family Medicine

## 2024-04-27 NOTE — Progress Notes (Incomplete)
 Cynthia Duran    [id number]    1965-12-14  Primary Care Physician:Hunter, Garnette KIDD, MD Date of Appointment: 04/27/2024 Established Patient Visit  Chief complaint:   No chief complaint on file.    HPI: Cynthia Duran is a 58 y.o. woman with tobacco use disorder quit 2024.   Interval Updates: Here for follow up with PFTS which show very sereve copd.   Feels breathing is getting worse. Still on anoro 1 puff once daily. Albuterol  prn use is minimal but only because she forgets to take it. It does help when she takes it.   She went to the beach and avoids walking due to dyspnea. Feels tired all the time.                                                                                   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 Wants to be around for children and grandchildren.   I have reviewed the patient's family social and past medical history and updated as appropriate.   Past Medical History:  Diagnosis Date   Arthritis Dx 2006   Frequent UTI    Knee joint disorder    reports gel injections in past   Pneumonia    Yeast infection     Past Surgical History:  Procedure Laterality Date   cyst removed Right 2009    elbow, Dr. Velva    LAPAROSCOPIC ABDOMINAL EXPLORATION      Family History  Problem Relation Age of Onset   COPD Mother    Rheum arthritis Mother        biological mom   Heart disease Father    Hypertension Father    Diabetes Maternal Grandmother    Thyroid  disease Maternal Grandmother    Hypertension Brother    Psoriasis Brother    Colon cancer Neg Hx     Social History   Occupational History   Occupation: Unemployed   Tobacco Use   Smoking status: Former    Current packs/day: 1.50    Average packs/day: 1.5 packs/day for 42.0 years (63.0 ttl pk-yrs)    Types: Cigarettes   Smokeless tobacco: Never   Tobacco comments:    Quit October of last year  Substance and Sexual Activity   Alcohol use:  Yes    Comment: socially    Drug use: No   Sexual activity: Not Currently    Birth control/protection: Post-menopausal     Physical Exam: Last menstrual period 09/27/2007.  Gen:      No acute distress, fatigued, appears older than stated age ENT:  no nasal polyps, mucus membranes moist Lungs:  diminished, no wheezes or crackles CV:         Regular rate and rhythm; no murmurs, rubs, or gallops.  No pedal edema   Data Reviewed: Imaging: I have personally reviewed the CT Chest feb 2025 - no suspicious nodules or masses, emphysema and bronchial wall thickening  PFTs:     Latest Ref Rng & Units 11/11/2023    9:54 AM 09/17/2015    1:38 PM  PFT Results  FVC-Pre L 1.82  2.60   FVC-Predicted Pre % 61  83   FVC-Post L 2.25  2.65   FVC-Predicted Post % 75  85   Pre FEV1/FVC % % 39  54   Post FEV1/FCV % % 41  54   FEV1-Pre L 0.71  1.41   FEV1-Predicted Pre % 30  56   FEV1-Post L 0.92  1.43   DLCO uncorrected ml/min/mmHg 8.04  10.70   DLCO UNC% % 44  56   DLCO corrected ml/min/mmHg  10.45   DLCO COR %Predicted %  55   DLVA Predicted % 45  52   TLC L 5.74  5.01   TLC % Predicted % 128  112   RV % Predicted % 198  148    I have personally reviewed the patient's PFTs and very severe airflow limitation with hyperinflation and air trapping  Labs: Lab Results  Component Value Date   NA 141 03/30/2024   K 3.7 03/30/2024   CO2 27 03/30/2024   GLUCOSE 96 03/30/2024   BUN 4 (L) 03/30/2024   CREATININE 0.67 03/30/2024   CALCIUM  9.1 03/30/2024   GFR 97.95 03/30/2024   GFRNONAA >60 02/08/2023   Lab Results  Component Value Date   WBC 6.5 02/25/2024   HGB 14.7 02/25/2024   HCT 43.8 02/25/2024   MCV 91.8 02/25/2024   PLT 220.0 02/25/2024    Immunization status:  There is no immunization history on file for this patient.  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    Plan/Recommendations: Sorry to hear your breathing is not doing well.  Your COPD seems to be worsening since 2017 based on the breathing testing we did today.  Stop the Anoro inhaler and we will switch you to Breztri  inhaler 2 puffs twice daily, gargle after use.  Continue the albuterol  inhaler as needed.  I am also sending a nebulizer machine to your home through a DME company and will send some breathing treatments to your pharmacy for you to take as needed.  I am referring you to pulmonary rehab which is a structured exercise program for patients with chronic lung disease.  I think you will really enjoy this and it will help your daily symptoms of breathlessness.  For your pelvic floor and urinary symptoms I recommend seeing a urogynecologist.  Next due for scan in Feb 2026.   Return to Care: No follow-ups on file.   [name], MD Pulmonary and Critical Care Medicine [facility] [phone]

## 2024-04-28 ENCOUNTER — Ambulatory Visit

## 2024-04-28 VITALS — BP 117/73 | HR 71 | Temp 98.2°F | Resp 18 | Ht 60.0 in | Wt 98.2 lb

## 2024-04-28 DIAGNOSIS — R918 Other nonspecific abnormal finding of lung field: Secondary | ICD-10-CM

## 2024-04-28 DIAGNOSIS — F17290 Nicotine dependence, other tobacco product, uncomplicated: Secondary | ICD-10-CM | POA: Diagnosis not present

## 2024-04-28 DIAGNOSIS — J4489 Other specified chronic obstructive pulmonary disease: Secondary | ICD-10-CM

## 2024-04-28 DIAGNOSIS — J439 Emphysema, unspecified: Secondary | ICD-10-CM

## 2024-04-28 DIAGNOSIS — J449 Chronic obstructive pulmonary disease, unspecified: Secondary | ICD-10-CM

## 2024-04-28 DIAGNOSIS — R0609 Other forms of dyspnea: Secondary | ICD-10-CM

## 2024-04-28 DIAGNOSIS — H539 Unspecified visual disturbance: Secondary | ICD-10-CM | POA: Diagnosis not present

## 2024-06-07 ENCOUNTER — Ambulatory Visit

## 2024-06-07 DIAGNOSIS — F17290 Nicotine dependence, other tobacco product, uncomplicated: Secondary | ICD-10-CM

## 2024-06-07 DIAGNOSIS — H539 Unspecified visual disturbance: Secondary | ICD-10-CM

## 2024-06-07 DIAGNOSIS — J449 Chronic obstructive pulmonary disease, unspecified: Secondary | ICD-10-CM | POA: Diagnosis not present

## 2024-06-07 DIAGNOSIS — J432 Centrilobular emphysema: Secondary | ICD-10-CM | POA: Diagnosis not present

## 2024-06-07 DIAGNOSIS — R918 Other nonspecific abnormal finding of lung field: Secondary | ICD-10-CM | POA: Diagnosis not present

## 2024-06-07 MED ORDER — ALBUTEROL SULFATE HFA 108 (90 BASE) MCG/ACT IN AERS: 2.0000 | INHALATION_SPRAY | Freq: Four times a day (QID) | RESPIRATORY_TRACT | 10 refills | Status: AC | PRN

## 2024-06-07 MED ORDER — ENSIFENTRINE 3 MG/2.5ML IN SUSP: 1.0000 | Freq: Two times a day (BID) | RESPIRATORY_TRACT | 10 refills | Status: AC

## 2024-06-07 MED ORDER — BREZTRI AEROSPHERE 160-9-4.8 MCG/ACT IN AERO: 2.0000 | INHALATION_SPRAY | Freq: Two times a day (BID) | RESPIRATORY_TRACT | 8 refills | Status: AC

## 2024-06-07 NOTE — Progress Notes (Signed)
 "              Cynthia Duran    [id number]    [birth year]  Primary Care Physician:Hunter, Garnette KIDD, MD Date of Appointment: 06/07/2024 Established Patient Visit  Chief complaint:   Chief Complaint  Patient presents with   Shortness of Breath   COPD    SOB with exertion.    History of Present Illness Cynthia Duran is a 59 year old female with severe COPD Stage 4, FEV1 30%. She is on Breztri . She was concern of breztri  causing Right eye blurry vision, despite a recent updated glasses prescription. She has dyspnea with minimal exertion such as walking to the mailbox or across a parking lot, often with associated chest pain and urgency to urinate. She HAS a disabled parking tag because of limited exercise tolerance. She quit cigarettes in October 2024 but continues to vape nicotine  and plan to decrease. She was sent to pulmonary rehab, but given financial reasons, she was not able to participate.   Interval history -Patient continue with same dyspnea on exertion. 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 -She continues taking breztri . She has on/off R eye blurry vision, it has been the same. She has an appt schedule on March 4th. -She has same cough and little mucus. -No recent flares. -She has been dealing with a recent cancer diagnosis of her grandson.  -She has been sedentary. -She has reduced her vaping activities with plan to stop in 02/14  Past Medical History:  Diagnosis Date   Arthritis Dx 2006   Frequent UTI    Knee joint disorder    reports gel injections in past   Pneumonia    Yeast infection     Past Surgical History:  Procedure Laterality Date   cyst removed Right 2009    elbow, Dr. Velva    LAPAROSCOPIC ABDOMINAL EXPLORATION      Family History  Problem Relation Age of Onset   COPD Mother    Rheum arthritis Mother        biological mom   Heart disease Father    Hypertension Father    Diabetes Maternal Grandmother    Thyroid   disease Maternal Grandmother    Hypertension Brother    Psoriasis Brother    Colon cancer Neg Hx     Social History   Occupational History   Occupation: Unemployed   Tobacco Use   Smoking status: Former    Current packs/day: 1.50    Average packs/day: 1.5 packs/day for 42.0 years (63.0 ttl pk-yrs)    Types: Cigarettes   Smokeless tobacco: Never   Tobacco comments:    Quit October of last year  Substance and Sexual Activity   Alcohol use: Yes    Comment: socially    Drug use: No   Sexual activity: Not Currently    Birth control/protection: Post-menopausal     Physical Exam: Last menstrual period 09/27/2007.  Gen:      No acute distress, fatigued, appears older than stated age ENT:  no nasal polyps, mucus membranes moist Lungs:  diminished, no wheezes or crackles CV:         Regular rate and rhythm; no murmurs, rubs, or gallops.   No pedal edema Neuro: aox3, no focalization  Data Reviewed: Imaging: I have personally reviewed the CT Chest feb 2025 - no suspicious nodules or masses, emphysema and bronchial wall thickening  PFTs:     Latest Ref Rng & Units 11/11/2023  9:54 AM 09/17/2015    1:38 PM  PFT Results  FVC-Pre L 1.82  2.60   FVC-Predicted Pre % 61  83   FVC-Post L 2.25  2.65   FVC-Predicted Post % 75  85   Pre FEV1/FVC % % 39  54   Post FEV1/FCV % % 41  54   FEV1-Pre L 0.71  1.41   FEV1-Predicted Pre % 30  56   FEV1-Post L 0.92  1.43   DLCO uncorrected ml/min/mmHg 8.04  10.70   DLCO UNC% % 44  56   DLCO corrected ml/min/mmHg  10.45   DLCO COR %Predicted %  55   DLVA Predicted % 45  52   TLC L 5.74  5.01   TLC % Predicted % 128  112   RV % Predicted % 198  148    I have personally reviewed the patient's PFTs and very severe airflow limitation with hyperinflation and air trapping  Labs: Lab Results  Component Value Date   NA 141 03/30/2024   K 3.7 03/30/2024   CO2 27 03/30/2024   GLUCOSE 96 03/30/2024   BUN 4 (L) 03/30/2024   CREATININE 0.67  03/30/2024   CALCIUM  9.1 03/30/2024   GFR 97.95 03/30/2024   GFRNONAA >60 02/08/2023   Lab Results  Component Value Date   WBC 6.5 02/25/2024   HGB 14.7 02/25/2024   HCT 43.8 02/25/2024   MCV 91.8 02/25/2024   PLT 220.0 02/25/2024   Walking test 06/07/24 6 laps on continuous and POC oxygen with nadir O2 sat 94%.  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 On lung cancer screening.    Assessment & Plan COPD- stage 4 FEV1 30% Severe COPD with chronic bronchitis and emphysema causing significant dyspnea, fatigue, and breathlessness. Walking test, 6 laps with nadir oxygen of 94%.  Financial constraints limit access to pulmonary rehabilitation. She has nebulizer machine. Emphasized exercise and walking at home.  - Continue Breztri  inhaler.  - Start nebulizer Ohtuvayre  BID  - No need of oxygen on walking test today. - I will reassess PFT in 6-8 months as well IgE, eos. She may meet criteria for biological therapy. - I emphasized the importance of exercise daily. I may consider online pulm rehab. - If not improvement I will send her for EBV evaluation.  Visual disturbance for evaluation  Recent foggy vision in the right eye. She was seen by eye doctor with new prescription of glasses. Differential includes corticosteroid-induced cataract or glaucoma.  - Referred to ophthalmologist for evaluation of possible cataract or glaucoma. She has an appt on March 4th. - Consider switching from Breztri  to Anoro if ophthalmology evaluation indicates vision issues related to Breztri .   Nicotine  dependence (vaping) Continued nicotine  dependence with vaping approximately 10 times per day. Acknowledged vaping is not beneficial and plans to cease use soon. D - Advised cessation of vaping, plan to stop on 02/14.  I spent 40 minutes in this encounter including  15 minutes counseling, and 25 minutes discussing treatment options. I spent 15 minutes reviewing the chart.    Return to Care: Return in about 4 months (around 10/05/2024).  Marny Patch MD Pulmonary and Critical Care Medicine [facility] Office:[phone]      "

## 2024-06-07 NOTE — Patient Instructions (Addendum)
 Dear Ms. Cynthia Duran;  I will recommend the following: -Continue breztri  2 puffs twice a day -I will add a new medication called Ohtuvayre  1 nebulizer twice a day.  -I will repeat a pulmonary function test in 6-8 months of this therapy to see if there is improvement of your pulmonary function test. -You will follow up with your eye doctor on March 4th, for an eye evaluation.  -Congratulations in making a great progress on reducing vaping activity.  -I highly recommend you to continue exercise, walking and doing some laps in the house every day.

## 2024-06-16 ENCOUNTER — Inpatient Hospital Stay
Admission: RE | Admit: 2024-06-16 | Discharge: 2024-06-16 | Disposition: A | Source: Ambulatory Visit | Attending: Acute Care | Admitting: Acute Care

## 2024-06-16 DIAGNOSIS — Z87891 Personal history of nicotine dependence: Secondary | ICD-10-CM

## 2024-06-16 DIAGNOSIS — Z122 Encounter for screening for malignant neoplasm of respiratory organs: Secondary | ICD-10-CM

## 2024-06-17 ENCOUNTER — Telehealth: Payer: Self-pay

## 2024-06-17 NOTE — Telephone Encounter (Signed)
 Received fax from [facility] stating they received an rx for Ohtuvayre  but pt was not enrolled through [employer]. Will need paperwork completed and sent to [facility] before pt can obtain Ohtuvayre .

## 2024-09-29 ENCOUNTER — Ambulatory Visit
